# Patient Record
Sex: Female | Born: 1980 | Race: Black or African American | Hispanic: No | Marital: Single | Smoking: Former smoker
Health system: Southern US, Community
[De-identification: ages and names within clinical notes are randomized; demographics above are authoritative.]

## PROBLEM LIST (undated history)

## (undated) ENCOUNTER — Inpatient Hospital Stay: Payer: Self-pay | Admitting: Cardiology

## (undated) DIAGNOSIS — E785 Hyperlipidemia, unspecified: Secondary | ICD-10-CM

## (undated) DIAGNOSIS — K219 Gastro-esophageal reflux disease without esophagitis: Secondary | ICD-10-CM

## (undated) DIAGNOSIS — I10 Essential (primary) hypertension: Secondary | ICD-10-CM

## (undated) DIAGNOSIS — Z720 Tobacco use: Secondary | ICD-10-CM

## (undated) DIAGNOSIS — Z955 Presence of coronary angioplasty implant and graft: Secondary | ICD-10-CM

## (undated) DIAGNOSIS — I2510 Atherosclerotic heart disease of native coronary artery without angina pectoris: Secondary | ICD-10-CM

## (undated) DIAGNOSIS — I219 Acute myocardial infarction, unspecified: Secondary | ICD-10-CM

## (undated) HISTORY — PX: TUBAL LIGATION: SHX77

## (undated) HISTORY — DX: Essential (primary) hypertension: I10

## (undated) HISTORY — DX: Hyperlipidemia, unspecified: E78.5

---

## 2006-02-11 ENCOUNTER — Emergency Department: Payer: Self-pay | Admitting: Emergency Medicine

## 2006-02-12 ENCOUNTER — Ambulatory Visit: Payer: Self-pay

## 2006-03-13 ENCOUNTER — Emergency Department: Payer: Self-pay | Admitting: Emergency Medicine

## 2006-04-10 ENCOUNTER — Ambulatory Visit: Payer: Self-pay | Admitting: Unknown Physician Specialty

## 2006-04-22 ENCOUNTER — Emergency Department: Payer: Self-pay | Admitting: Emergency Medicine

## 2006-08-01 ENCOUNTER — Ambulatory Visit: Payer: Self-pay

## 2006-10-05 ENCOUNTER — Observation Stay: Payer: Self-pay

## 2006-10-09 ENCOUNTER — Inpatient Hospital Stay: Payer: Self-pay

## 2006-10-12 ENCOUNTER — Inpatient Hospital Stay: Payer: Self-pay

## 2007-07-15 ENCOUNTER — Emergency Department: Payer: Self-pay | Admitting: Emergency Medicine

## 2008-01-28 ENCOUNTER — Ambulatory Visit: Payer: Self-pay | Admitting: Urology

## 2008-02-05 ENCOUNTER — Observation Stay: Payer: Self-pay | Admitting: Unknown Physician Specialty

## 2008-02-13 ENCOUNTER — Observation Stay: Payer: Self-pay | Admitting: Unknown Physician Specialty

## 2008-02-16 ENCOUNTER — Inpatient Hospital Stay: Payer: Self-pay | Admitting: Obstetrics and Gynecology

## 2008-03-28 ENCOUNTER — Ambulatory Visit: Payer: Self-pay | Admitting: Urology

## 2008-03-31 ENCOUNTER — Ambulatory Visit: Payer: Self-pay | Admitting: Urology

## 2008-04-07 ENCOUNTER — Ambulatory Visit: Payer: Self-pay | Admitting: Urology

## 2008-04-16 ENCOUNTER — Ambulatory Visit: Payer: Self-pay | Admitting: Urology

## 2008-07-13 ENCOUNTER — Ambulatory Visit: Payer: Self-pay | Admitting: Urology

## 2008-07-16 ENCOUNTER — Ambulatory Visit: Payer: Self-pay | Admitting: Urology

## 2008-07-22 ENCOUNTER — Ambulatory Visit: Payer: Self-pay | Admitting: Urology

## 2008-08-02 ENCOUNTER — Inpatient Hospital Stay: Payer: Self-pay | Admitting: Obstetrics and Gynecology

## 2008-08-17 ENCOUNTER — Ambulatory Visit: Payer: Self-pay | Admitting: Urology

## 2008-12-17 ENCOUNTER — Ambulatory Visit: Payer: Self-pay | Admitting: Family Medicine

## 2009-03-26 ENCOUNTER — Ambulatory Visit: Payer: Self-pay | Admitting: Family Medicine

## 2009-06-25 ENCOUNTER — Observation Stay: Payer: Self-pay

## 2009-07-01 ENCOUNTER — Inpatient Hospital Stay: Payer: Self-pay | Admitting: Obstetrics and Gynecology

## 2009-08-06 ENCOUNTER — Other Ambulatory Visit: Payer: Self-pay

## 2009-08-15 ENCOUNTER — Inpatient Hospital Stay: Payer: Self-pay

## 2009-09-29 IMAGING — CR DG ABDOMEN 1V
1 series · 3 of 3 positions shown · non-contrast
Comparison: none

REASON FOR EXAM: NEPHROLITHIASIS
COMMENTS:

[Series 1: view not recorded · 0.17mm/px · 3 of 3 slices shown]
[im 1/3]
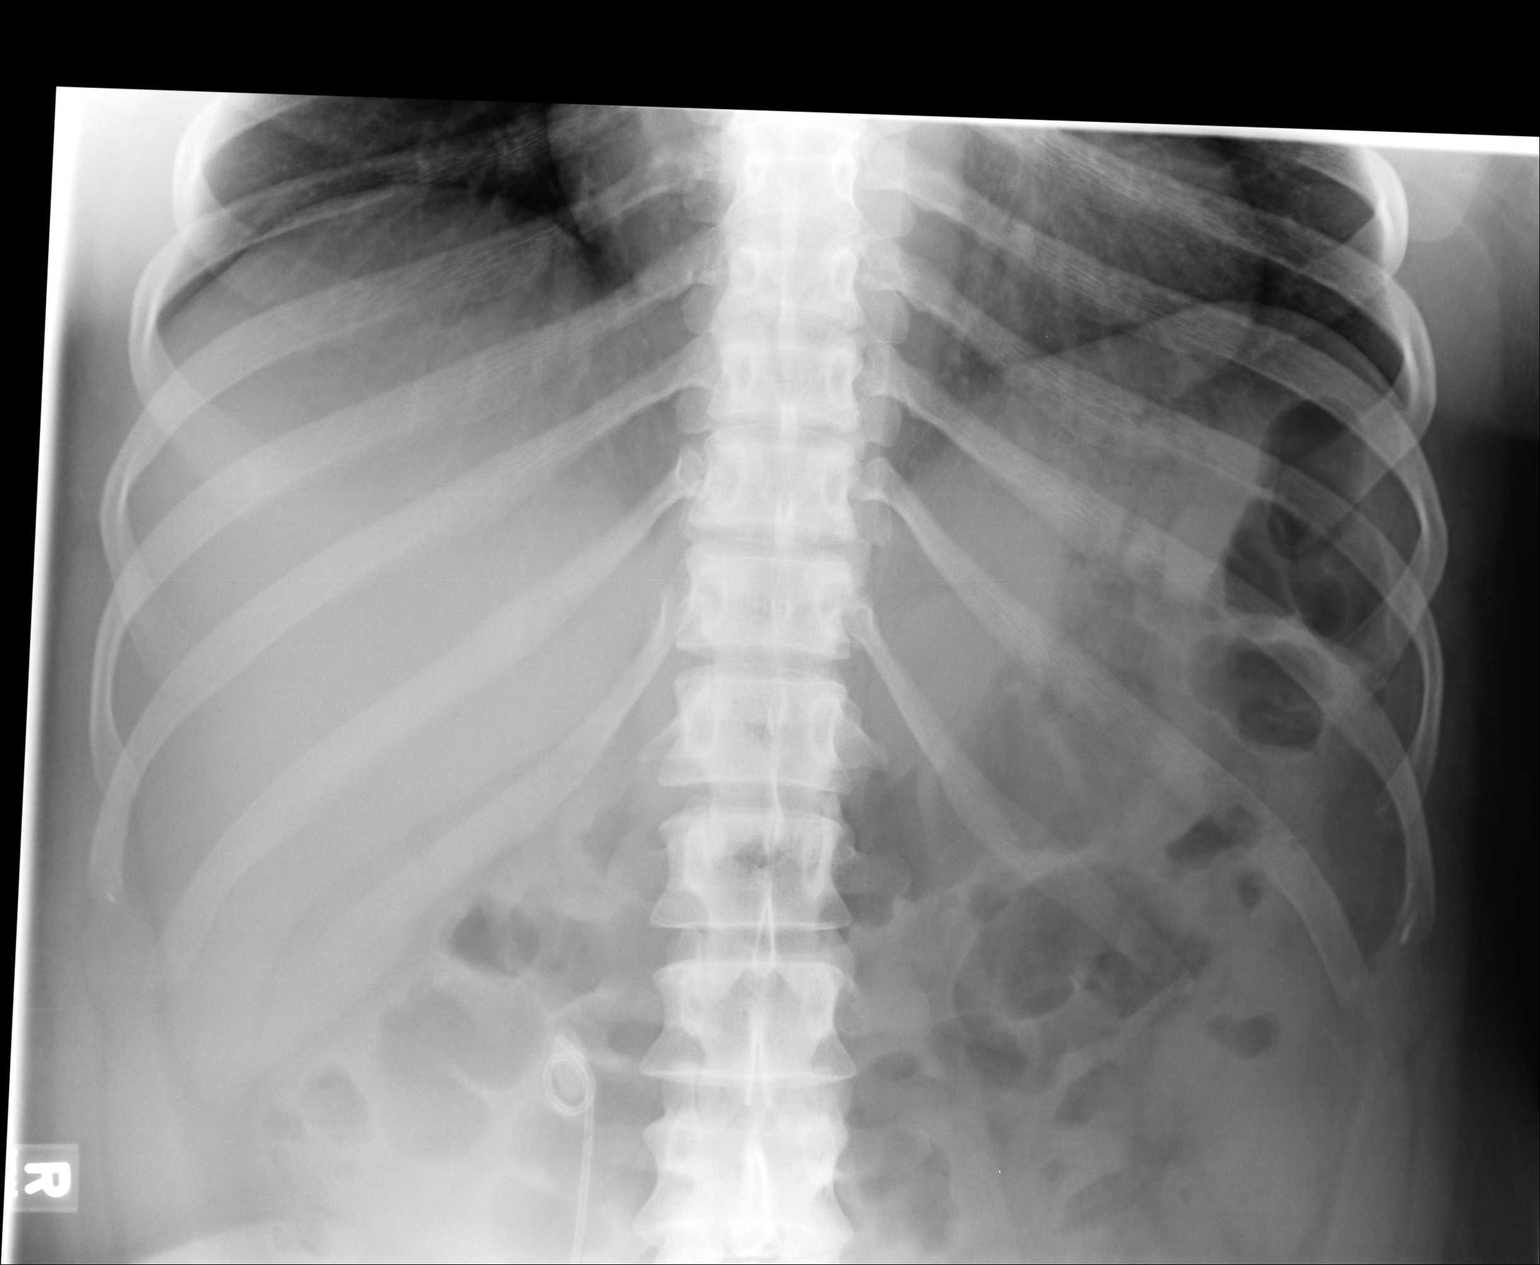
[im 2/3]
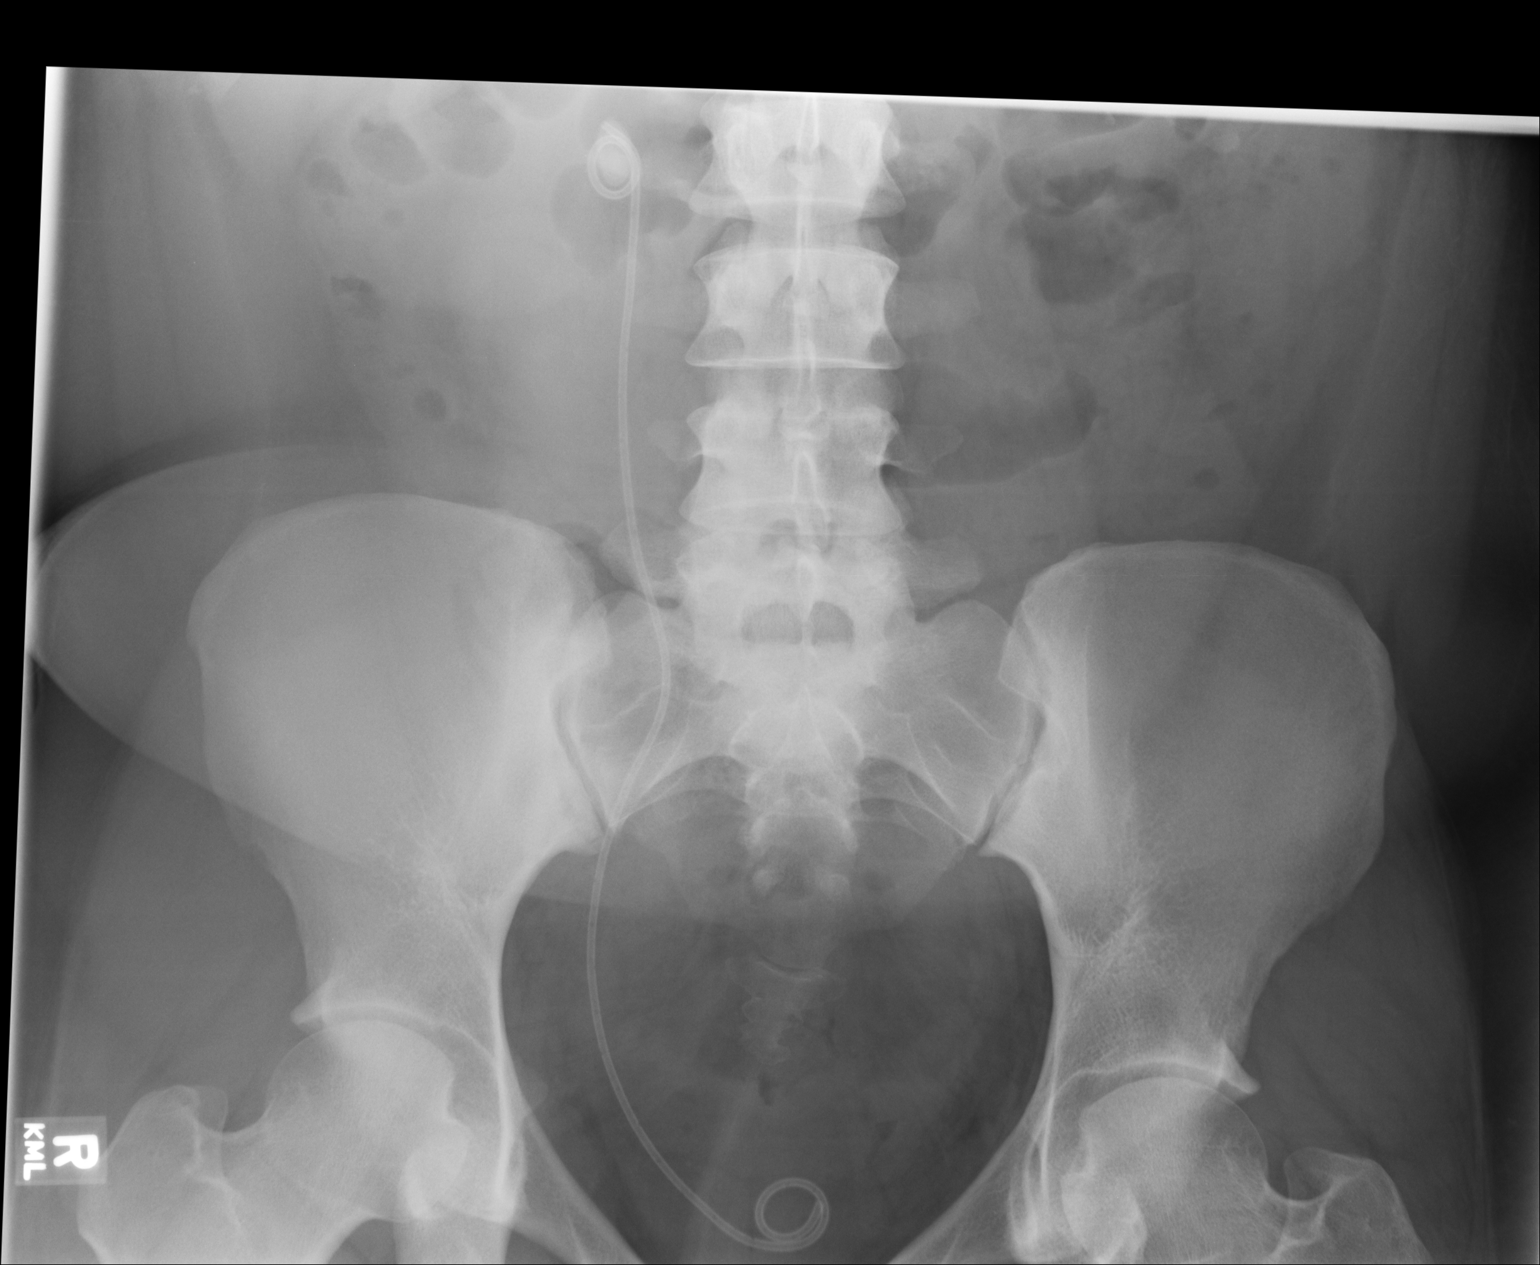
[im 3/3]
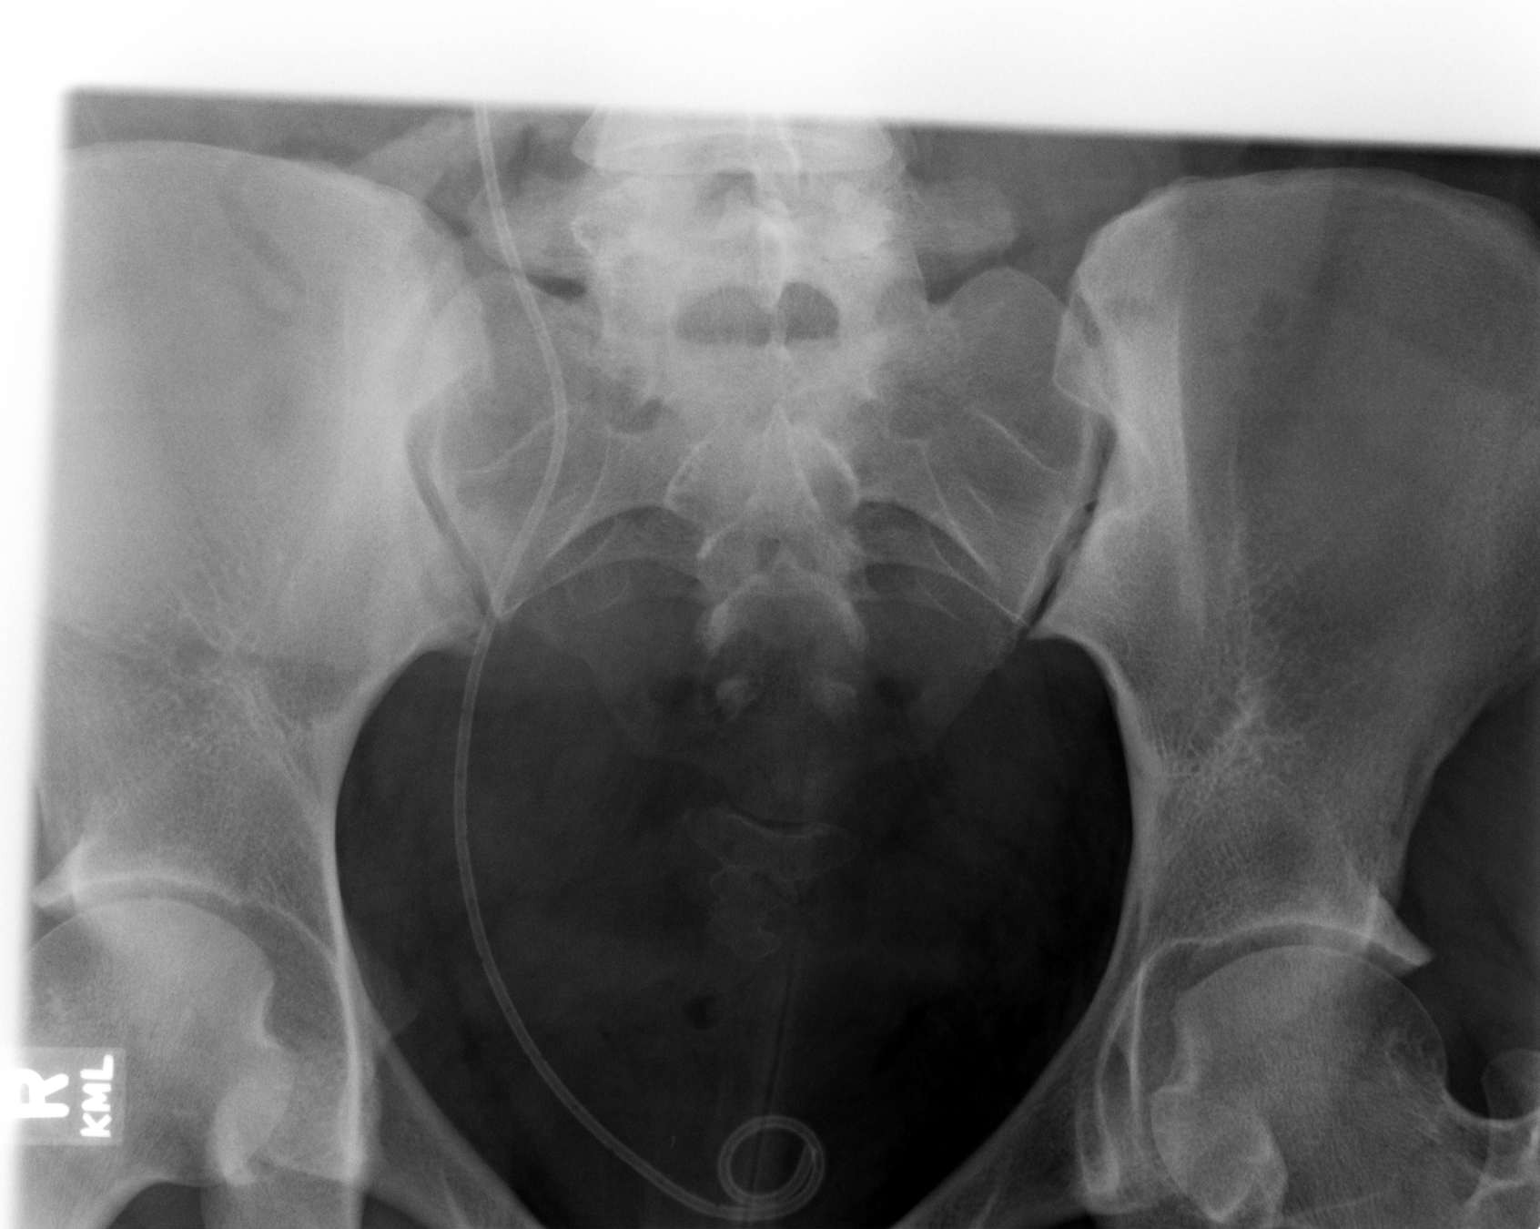

[3 of 3 positions shown; findings below may reference images not displayed]

PROCEDURE:     DXR - DXR KIDNEY URETER BLADDER  - July 13, 2008  [DATE]

RESULT:     Comparison is made to a prior study dated 04/16/08.

A RIGHT-sided double J ureteral stent is appreciated with the proximal tip
curled in the expected region of the RIGHT renal pelvis and distal tip
curled in the region of the urinary bladder. A rounded calcified density
projects in the area of the expected renal pelvis unchanged when compared to
the previous study likely representing a renal calculus.  No further calculi
are identified. Air is seen within nondilated loops of large and small
bowel.  The visualized bony skeleton is unremarkable.
IMPRESSION: RIGHT sided ureteral stent as well as what appears to be nephrolithiasis
involving the RIGHT kidney.

## 2009-10-18 IMAGING — US US RENAL KIDNEY
1 series · 17 of 25 positions shown · non-contrast
Comparison: none

REASON FOR EXAM: flank pain, hx of kidney stone
COMMENTS:

PROCEDURE:     US  - US KIDNEY  - August 02, 2008 [DATE]
RESULT:     Comparison: 01/28/2008
INDICATION: Flank pain

[Series 1: us renal kidney · 17 of 41 slices shown]
[im 1/41]
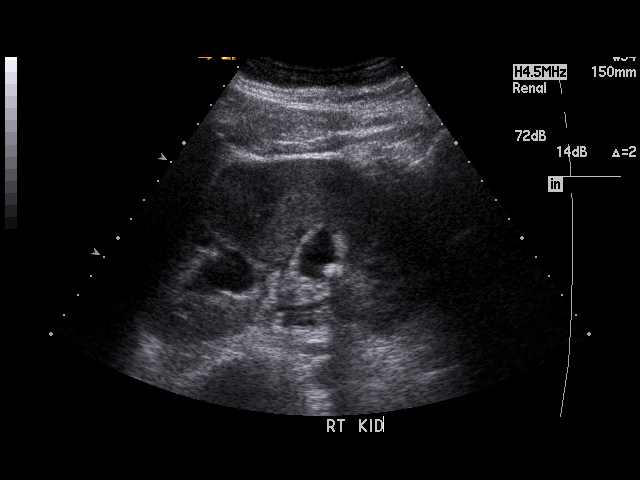
[im 4/41]
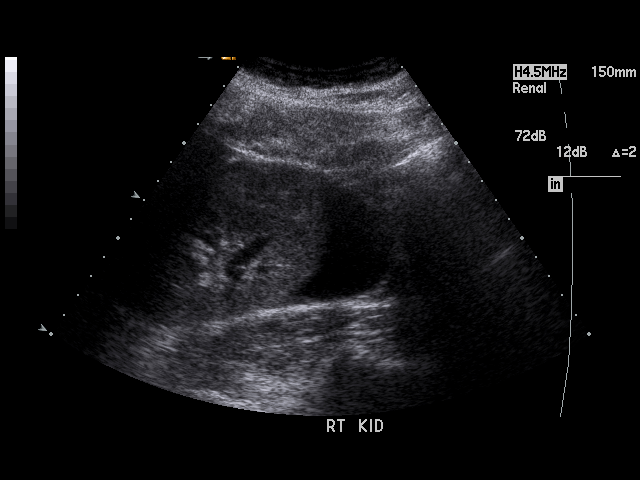
[im 6/41]
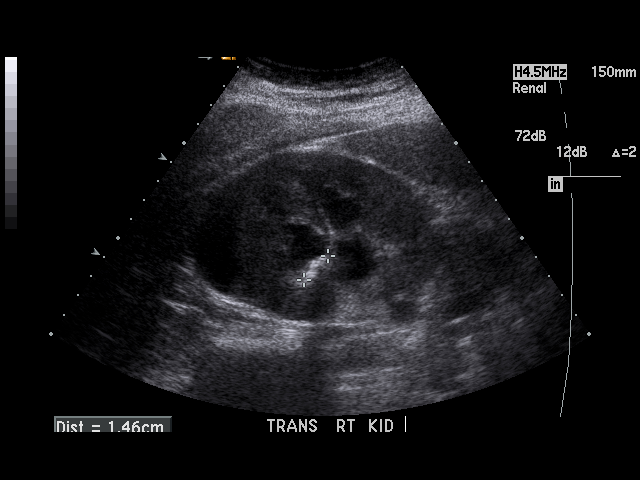
[im 9/41]
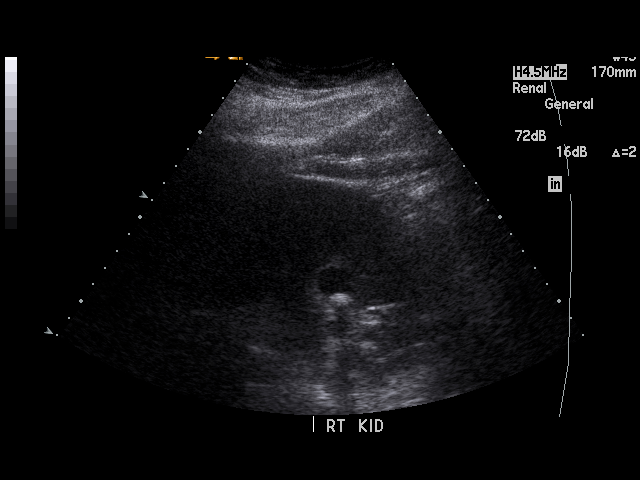
[im 11/41]
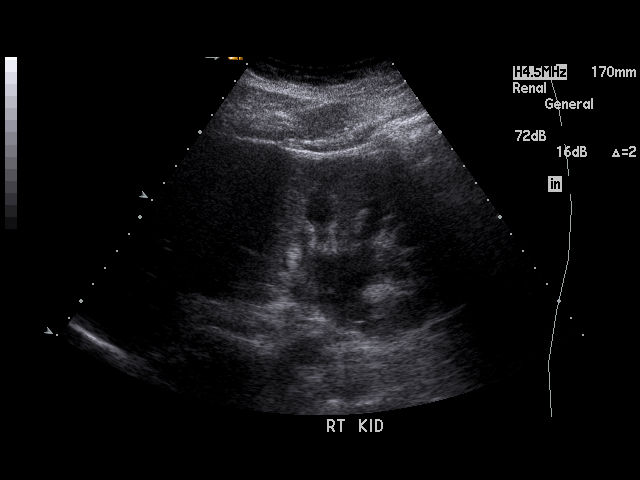
[im 14/41]
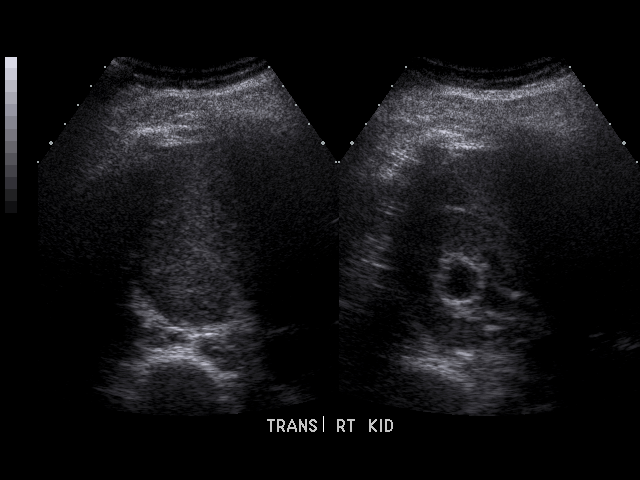
[im 16/41]
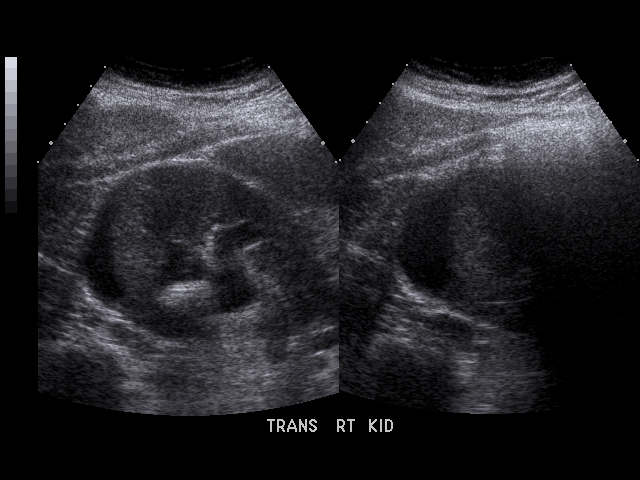
[im 19/41]
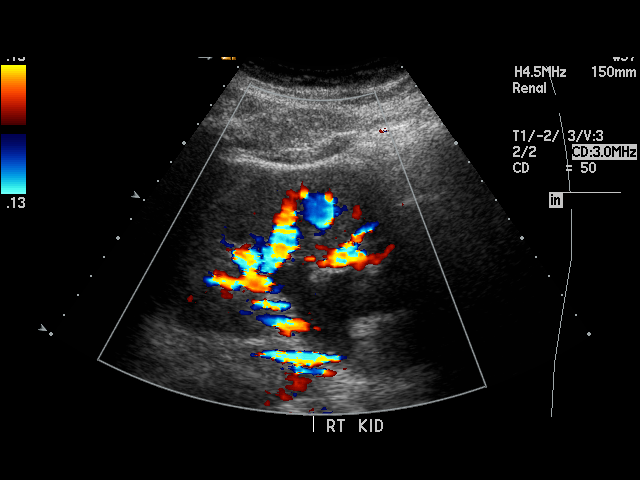
[im 21/41]
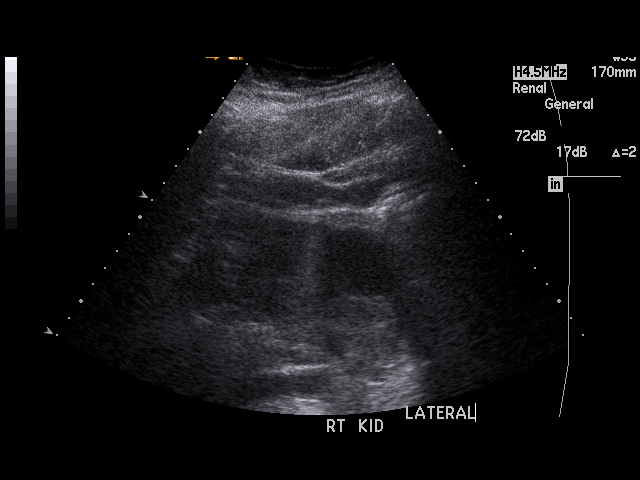
[im 22/41]
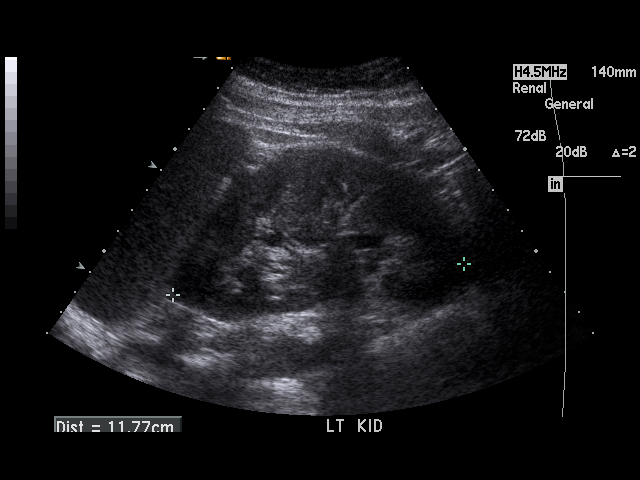
[im 26/41]
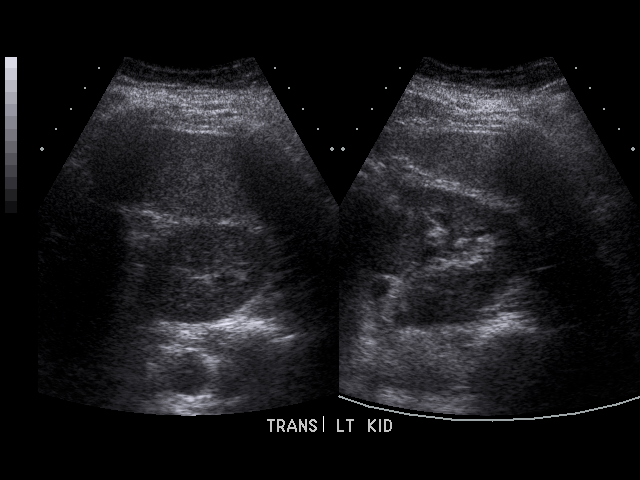
[im 27/41]
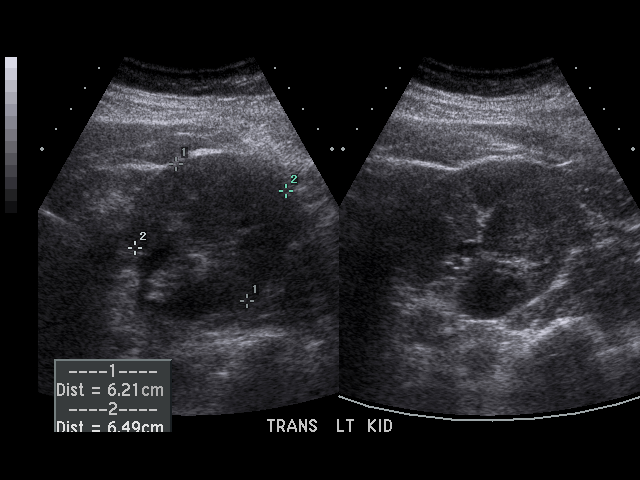
[im 31/41]
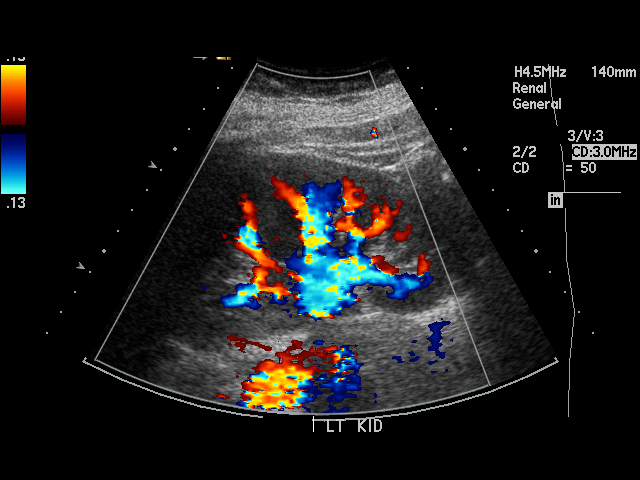
[im 32/41]
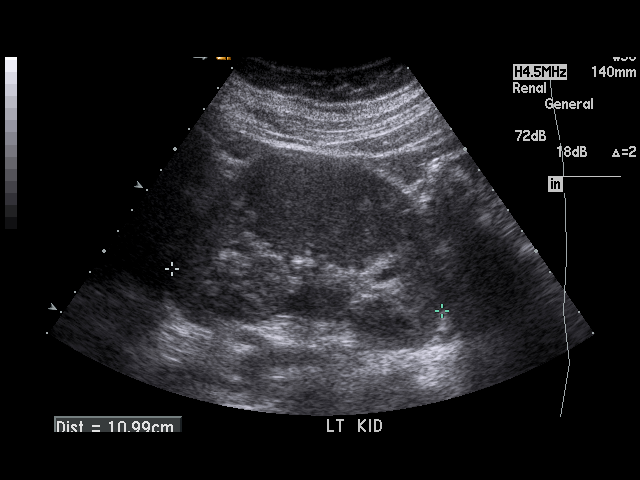
[im 36/41]
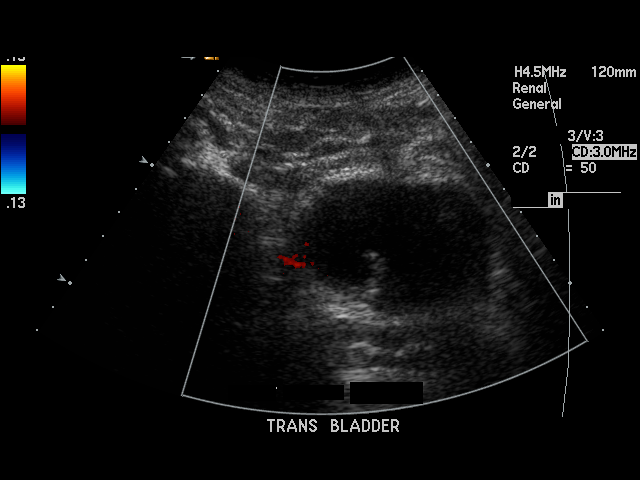
[im 37/41]
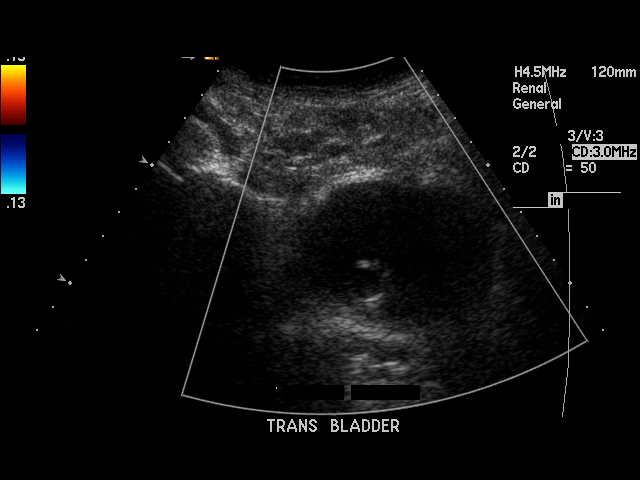
[im 41/41]
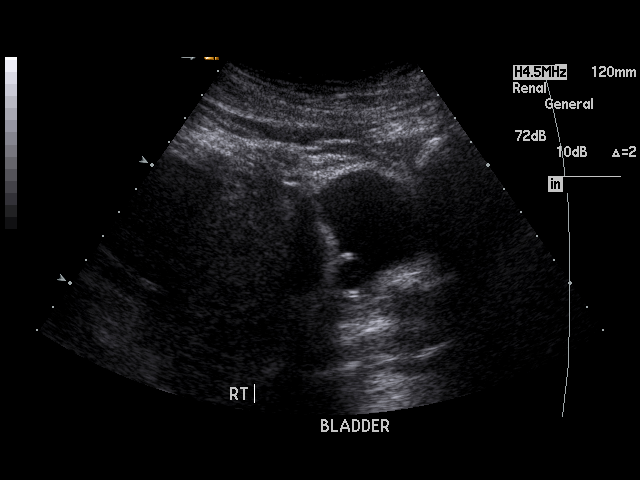

[17 of 25 positions shown; findings below may reference images not displayed]

Technique and findings: Multiple gray-scale and color doppler images of the
kidneys were obtained.

The right kidney measures 13.4 cm, the left kidney measures 11.8 cm.

There has been interval placement of a right-sided nephroureteral stent.
There is moderate right hydronephrosis. There is no left hydronephrosis.
There is a small amount of right perinephric fluid. There is no evidence of
a renal calculus.
IMPRESSION: Moderate right hydronephrosis with a right-sided nephroureteral stent
present.

## 2009-11-03 IMAGING — CR DG ABDOMEN 1V
1 series · 2 of 2 positions shown · non-contrast
Comparison: none

REASON FOR EXAM: nephrolithaisis
COMMENTS:

[Series 1: view not recorded · 0.17mm/px · 2 of 2 slices shown]
[im 1/2]
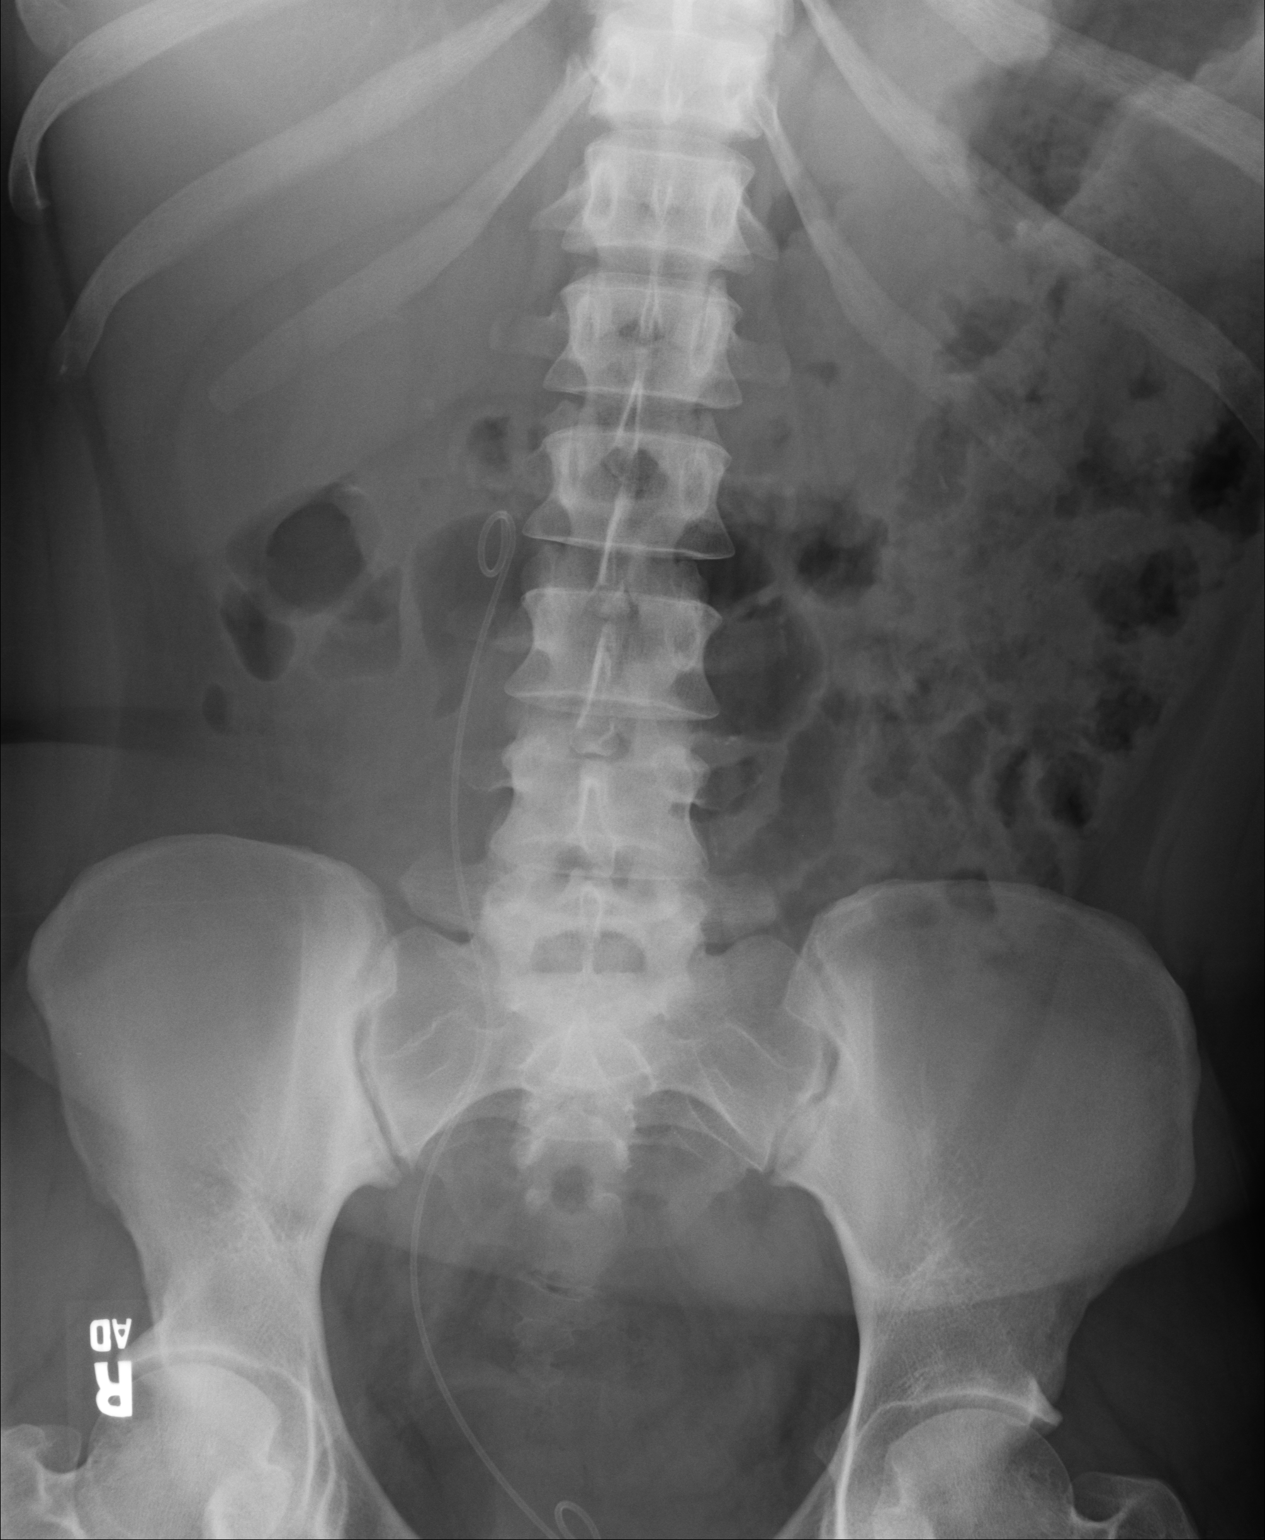
[im 2/2]
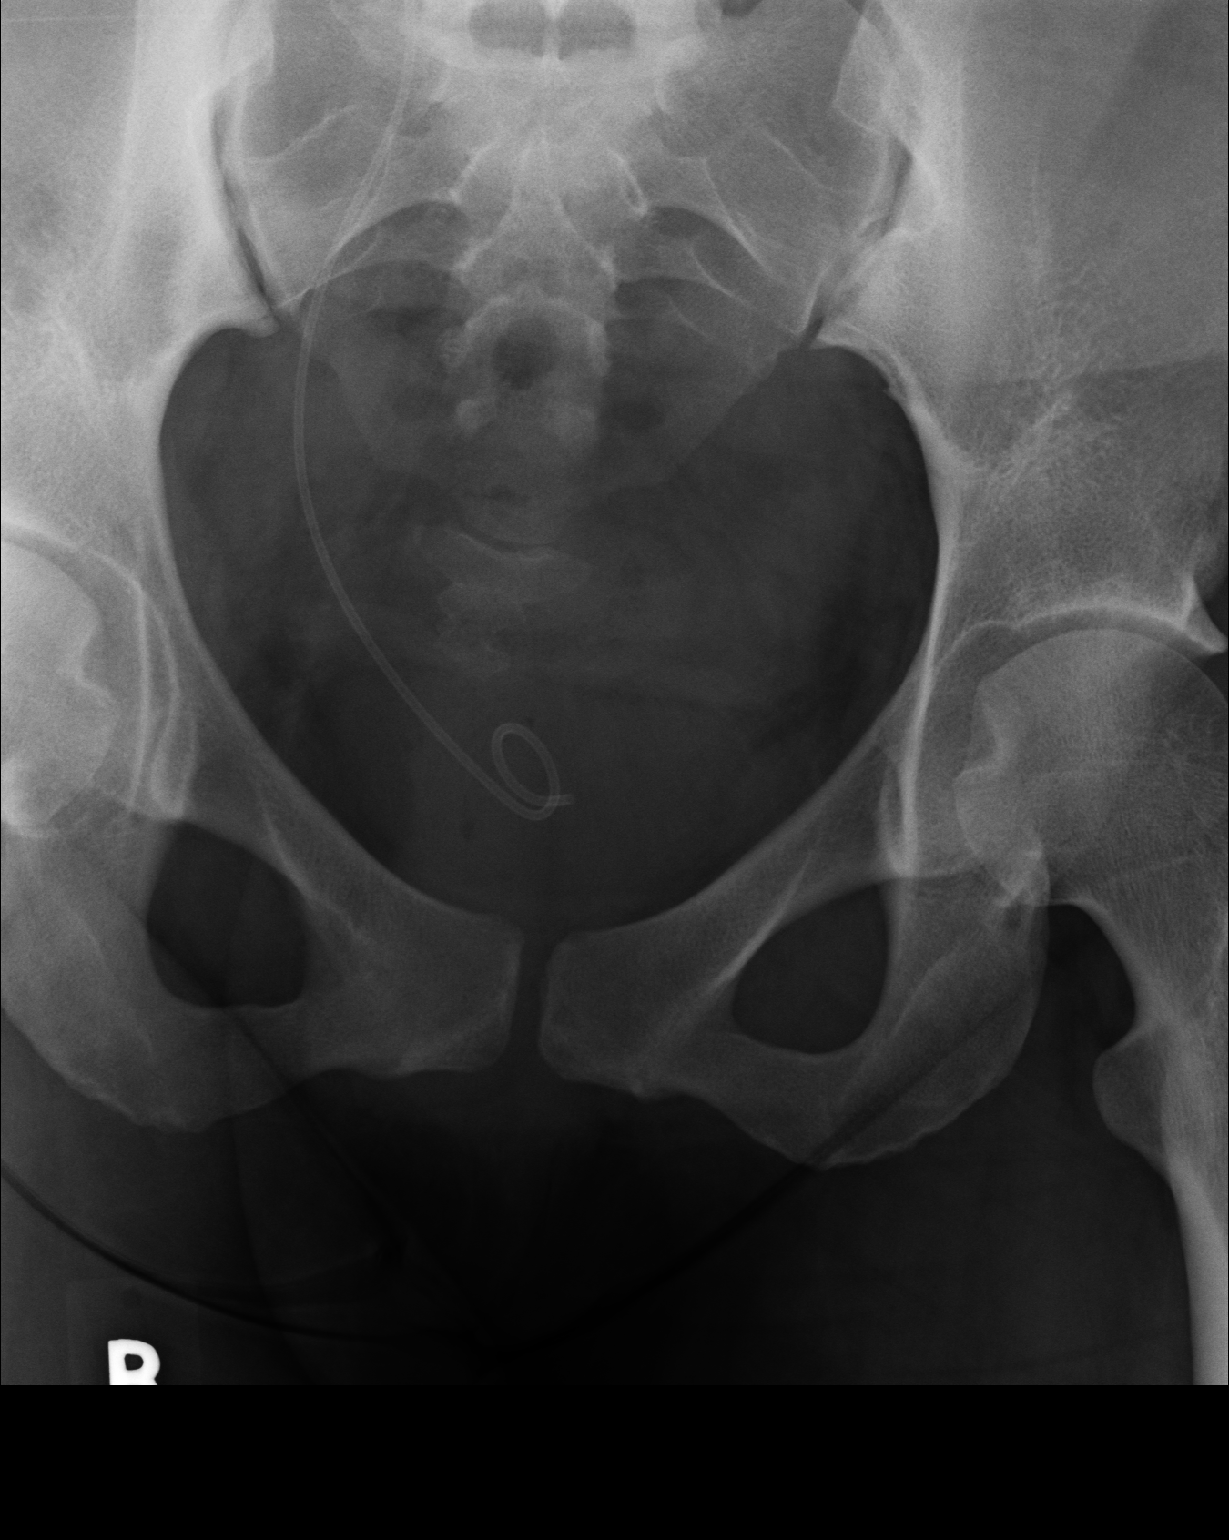

[2 of 2 positions shown; findings below may reference images not displayed]

PROCEDURE:     DXR - DXR KIDNEY URETER BLADDER  - August 17, 2008  [DATE]

RESULT:     Comparison is made to a prior exam of 07/13/2008. A double-J
stent is again noted on the RIGHT. The upper portion appears to be coiled in
the proximal RIGHT ureter. The previously present stone in the proximal
RIGHT ureter or renal pelvis is no longer observed. There is a faint density
projected medially over the RIGHT kidney which could represent a residual
small stone fragment this is not definite. No ureteral stones are
identified. No LEFT renal stones are noted.
IMPRESSION: Please see above.

## 2010-02-10 ENCOUNTER — Ambulatory Visit: Payer: Self-pay | Admitting: Unknown Physician Specialty

## 2010-02-11 ENCOUNTER — Ambulatory Visit: Payer: Self-pay | Admitting: Unknown Physician Specialty

## 2013-02-05 ENCOUNTER — Emergency Department: Payer: Self-pay | Admitting: Emergency Medicine

## 2014-09-10 ENCOUNTER — Emergency Department: Payer: Self-pay | Admitting: Emergency Medicine

## 2017-02-06 ENCOUNTER — Emergency Department
Admission: EM | Admit: 2017-02-06 | Discharge: 2017-02-06 | Disposition: A | Discharge status: Home / Self Care | Payer: Self-pay | Attending: Emergency Medicine | Admitting: Emergency Medicine

## 2017-02-06 ENCOUNTER — Emergency Department: Payer: Self-pay

## 2017-02-06 ENCOUNTER — Encounter: Payer: Self-pay | Admitting: Emergency Medicine

## 2017-02-06 DIAGNOSIS — M5412 Radiculopathy, cervical region: Secondary | ICD-10-CM | POA: Insufficient documentation

## 2017-02-06 DIAGNOSIS — R079 Chest pain, unspecified: Secondary | ICD-10-CM | POA: Insufficient documentation

## 2017-02-06 DIAGNOSIS — R0789 Other chest pain: Secondary | ICD-10-CM

## 2017-02-06 LAB — CBC
HCT: 41.2 % (ref 35.0–47.0)
Hemoglobin: 14.2 g/dL (ref 12.0–16.0)
MCH: 30.2 pg (ref 26.0–34.0)
MCHC: 34.4 g/dL (ref 32.0–36.0)
MCV: 87.8 fL (ref 80.0–100.0)
Platelets: 243 10*3/uL (ref 150–440)
RBC: 4.69 MIL/uL (ref 3.80–5.20)
RDW: 14.9 % — ABNORMAL HIGH (ref 11.5–14.5)
WBC: 10.8 10*3/uL (ref 3.6–11.0)

## 2017-02-06 LAB — BASIC METABOLIC PANEL
Anion gap: 5 (ref 5–15)
BUN: 10 mg/dL (ref 6–20)
CO2: 24 mmol/L (ref 22–32)
Calcium: 8.9 mg/dL (ref 8.9–10.3)
Chloride: 107 mmol/L (ref 101–111)
Creatinine, Ser: 0.82 mg/dL (ref 0.44–1.00)
GFR calc Af Amer: >60 mL/min (ref >60)
GFR calc non Af Amer: >60 mL/min (ref >60)
Glucose, Bld: 83 mg/dL (ref 65–99)
Potassium: 3.8 mmol/L (ref 3.5–5.1)
Sodium: 136 mmol/L (ref 135–145)

## 2017-02-06 LAB — TROPONIN I: Troponin I: <0.03 ng/mL (ref <0.03)

## 2017-02-06 MED ORDER — PREDNISONE 20 MG PO TABS: 40.0000 mg | ORAL_TABLET | Freq: Every day | ORAL | 0 refills | Status: DC

## 2017-02-06 MED ORDER — FAMOTIDINE 20 MG PO TABS: 20.0000 mg | ORAL_TABLET | Freq: Two times a day (BID) | ORAL | 0 refills | Status: DC

## 2017-02-06 MED ORDER — NAPROXEN 500 MG PO TABS: 500.0000 mg | ORAL_TABLET | Freq: Two times a day (BID) | ORAL | 0 refills | Status: DC

## 2017-02-06 NOTE — Discharge Instructions (Signed)
Results for orders placed or performed during the hospital encounter of 02/06/17  Basic metabolic panel  Result Value Ref Range   Sodium 136 135 - 145 mmol/L   Potassium 3.8 3.5 - 5.1 mmol/L   Chloride 107 101 - 111 mmol/L   CO2 24 22 - 32 mmol/L   Glucose, Bld 83 65 - 99 mg/dL   BUN 10 6 - 20 mg/dL   Creatinine, Ser 8.110.82 0.44 - 1.00 mg/dL   Calcium 8.9 8.9 - 91.410.3 mg/dL   GFR calc non Af Amer >60 >60 mL/min   GFR calc Af Amer >60 >60 mL/min   Anion gap 5 5 - 15  CBC  Result Value Ref Range   WBC 10.8 3.6 - 11.0 K/uL   RBC 4.69 3.80 - 5.20 MIL/uL   Hemoglobin 14.2 12.0 - 16.0 g/dL   HCT 78.241.2 95.635.0 - 21.347.0 %   MCV 87.8 80.0 - 100.0 fL   MCH 30.2 26.0 - 34.0 pg   MCHC 34.4 32.0 - 36.0 g/dL   RDW 08.614.9 (H) 57.811.5 - 46.914.5 %   Platelets 243 150 - 440 K/uL  Troponin I  Result Value Ref Range   Troponin I <0.03 <0.03 ng/mL   Dg Chest 2 View  Result Date: 02/06/2017 CLINICAL DATA:  Chest, left arm numbness for 2 days EXAM: CHEST  2 VIEW COMPARISON:  None. FINDINGS: The heart size and mediastinal contours are within normal limits. Both lungs are clear. The visualized skeletal structures are unremarkable. IMPRESSION: No active cardiopulmonary disease. Electronically Signed   By: Elige KoHetal  Patel   On: 02/06/2017 16:59

## 2017-02-06 NOTE — ED Triage Notes (Signed)
Pt reports centralized chest pain that started last night, denies N/V/SHOB. Also reports left arm intermittent numbness that started a few days ago. Pt reports some dizziness.

## 2017-02-06 NOTE — ED Provider Notes (Signed)
[facility] Emergency Department Provider Note  ____________________________________________  Time seen: Approximately 5:32 PM  I have reviewed the triage vital signs and the nursing notes.   HISTORY  Chief Complaint Chest Pain    HPI Sonya [name] is a 36 y.o. female who complains of central chest pain that started last night, gradual onset, mild to moderate intensity at present, constant, no aggravating or alleviating factors. Described as dull. Nonpleuritic nonexertional. No associated shortness of breath diaphoresis or vomiting. Nonradiating.  She also reports left neck pain and intermittent paresthesia in the left upper extremity for the past few days. Worse with neck movement. No history of trauma.     No past medical history on file. None  There are no active problems to display for this patient.    No past surgical history on file. None  Prior to Admission medications   Medication Sig Start Date End Date Taking? Authorizing Provider  famotidine (PEPCID) 20 MG tablet Take 1 tablet (20 mg total) by mouth 2 (two) times daily. 02/06/17   Sharman CheekStafford, Zimere Dunlevy, MD  naproxen (NAPROSYN) 500 MG tablet Take 1 tablet (500 mg total) by mouth 2 (two) times daily with a meal. 02/06/17   Sharman CheekStafford, Iva Montelongo, MD  predniSONE (DELTASONE) 20 MG tablet Take 2 tablets (40 mg total) by mouth daily. 02/06/17   Sharman CheekStafford, Dameer Speiser, MD     Allergies Penicillins   No family history on file.  Social History Social History  Substance Use Topics  . Smoking status: Not on file  . Smokeless tobacco: Not on file  . Alcohol use Not on file  No tobacco or alcohol use  Review of Systems  Constitutional:   No fever or chills.  ENT:   No sore throat. No rhinorrhea. Cardiovascular:   Positive as above for chest pain without syncope. Respiratory:   No dyspnea or cough. Gastrointestinal:   Negative for abdominal pain, vomiting and diarrhea.  Musculoskeletal:   Positive  left neck pain and left arm paresthesia All other systems reviewed and are negative except as documented above in ROS and HPI.  ____________________________________________   PHYSICAL EXAM:  VITAL SIGNS: ED Triage Vitals [02/06/17 1625]  Enc Vitals Group     BP (!) 143/95     Pulse Rate 86     Resp 16     Temp 98.6 F (37 C)     Temp Source Oral     SpO2 100 %     Weight 230 lb (104.3 kg)     Height 5\' 11"  (1.803 m)     Head Circumference      Peak Flow      Pain Score 7     Pain Loc      Pain Edu?      Excl. in GC?     Vital signs reviewed, nursing assessments reviewed.   Constitutional:   Alert and oriented. Well appearing and in no distress. Eyes:   No scleral icterus.  EOMI. No nystagmus. No conjunctival pallor. PERRL. ENT   Head:   Normocephalic and atraumatic.   Nose:   No congestion/rhinnorhea.    Mouth/Throat:   MMM, no pharyngeal erythema. No peritonsillar mass.    Neck:   No meningismus. Full ROM. Neck pain and arm pain are reproduced by having the patient rotate the neck to the left and axial load the head and C-spine. Hematological/Lymphatic/Immunilogical:   No cervical lymphadenopathy. Cardiovascular:   RRR. Symmetric bilateral radial and DP pulses.  No  murmurs.  Respiratory:   Normal respiratory effort without tachypnea/retractions. Breath sounds are clear and equal bilaterally. No wheezes/rales/rhonchi. Gastrointestinal:   Soft and nontender. Non distended. There is no CVA tenderness.  No rebound, rigidity, or guarding. Genitourinary:   deferred Musculoskeletal:   Normal range of motion in all extremities. No joint effusions.  No lower extremity tenderness.  No edema. Neurologic:   Normal speech and language.  Motor grossly intact. No gross focal neurologic deficits are appreciated.  Skin:    Skin is warm, dry and intact. No rash noted.  No petechiae, purpura, or bullae.  ____________________________________________    LABS (pertinent  positives/negatives) (all labs ordered are listed, but only abnormal results are displayed) Labs Reviewed  CBC - Abnormal; Notable for the following:       Result Value   RDW 14.9 (*)    All other components within normal limits  BASIC METABOLIC PANEL  TROPONIN I   ____________________________________________   EKG  Interpreted by me Normal sinus rhythm rate of 87, normal axis and intervals. Normal QRS and ST segments. Isolated T-wave inversion in lead 3 which is nonspecific. No evidence of right heart strain.  ____________________________________________    RADIOLOGY  Dg Chest 2 View  Result Date: 02/06/2017 CLINICAL DATA:  Chest, left arm numbness for 2 days EXAM: CHEST  2 VIEW COMPARISON:  None. FINDINGS: The heart size and mediastinal contours are within normal limits. Both lungs are clear. The visualized skeletal structures are unremarkable. IMPRESSION: No active cardiopulmonary disease. Electronically Signed   By: Elige Ko   On: 02/06/2017 16:59    ____________________________________________   PROCEDURES Procedures  ____________________________________________   INITIAL IMPRESSION / ASSESSMENT AND PLAN / ED COURSE  Pertinent labs & imaging results that were available during my care of the patient were reviewed by me and considered in my medical decision making (see chart for details).  Patient well appearing no acute distress, presents with atypical chest pain as well as left upper extremity symptoms and left neck pain which are clinically due to cervical radiculopathy. NSAIDs and steroid burst for the radiculopathy, Pepcid for gastric protection. Follow-up with primary care. No evidence of motor weakness or severe neurologic impairment from the radiculopathy.Considering the patient's symptoms, medical history, and physical examination today, I have low suspicion for ACS, PE, TAD, pneumothorax, carditis, mediastinitis, pneumonia, CHF, or sepsis.         ____________________________________________   FINAL CLINICAL IMPRESSION(S) / ED DIAGNOSES  Final diagnoses:  Atypical chest pain  Cervical radiculopathy      New Prescriptions   FAMOTIDINE (PEPCID) 20 MG TABLET    Take 1 tablet (20 mg total) by mouth 2 (two) times daily.   NAPROXEN (NAPROSYN) 500 MG TABLET    Take 1 tablet (500 mg total) by mouth 2 (two) times daily with a meal.   PREDNISONE (DELTASONE) 20 MG TABLET    Take 2 tablets (40 mg total) by mouth daily.     Portions of this note were generated with dragon dictation software. Dictation errors may occur despite best attempts at proofreading.    Sharman Cheek, MD 02/06/17 [phone]

## 2017-09-11 ENCOUNTER — Encounter: Payer: Self-pay | Admitting: Emergency Medicine

## 2017-09-11 ENCOUNTER — Other Ambulatory Visit: Payer: Self-pay

## 2017-09-11 ENCOUNTER — Telehealth: Payer: Self-pay | Admitting: Emergency Medicine

## 2017-09-11 ENCOUNTER — Emergency Department
Admission: EM | Admit: 2017-09-11 | Discharge: 2017-09-11 | Disposition: A | Discharge status: Home / Self Care | Payer: Self-pay | Attending: Emergency Medicine | Admitting: Emergency Medicine

## 2017-09-11 DIAGNOSIS — R1030 Lower abdominal pain, unspecified: Secondary | ICD-10-CM | POA: Insufficient documentation

## 2017-09-11 DIAGNOSIS — Z5321 Procedure and treatment not carried out due to patient leaving prior to being seen by health care provider: Secondary | ICD-10-CM | POA: Insufficient documentation

## 2017-09-11 LAB — CBC WITH DIFFERENTIAL/PLATELET
Basophils Absolute: 0 10*3/uL (ref 0–0.1)
Basophils Relative: 0 %
Eosinophils Absolute: 0.1 10*3/uL (ref 0–0.7)
Eosinophils Relative: 0 %
HCT: 40.3 % (ref 35.0–47.0)
Hemoglobin: 13.6 g/dL (ref 12.0–16.0)
Lymphocytes Relative: 27 %
Lymphs Abs: 3.5 10*3/uL (ref 1.0–3.6)
MCH: 29.2 pg (ref 26.0–34.0)
MCHC: 33.8 g/dL (ref 32.0–36.0)
MCV: 86.6 fL (ref 80.0–100.0)
Monocytes Absolute: 1 10*3/uL — ABNORMAL HIGH (ref 0.2–0.9)
Monocytes Relative: 8 %
Neutro Abs: 8.2 10*3/uL — ABNORMAL HIGH (ref 1.4–6.5)
Neutrophils Relative %: 65 %
Platelets: 252 10*3/uL (ref 150–440)
RBC: 4.65 MIL/uL (ref 3.80–5.20)
RDW: 14.4 % (ref 11.5–14.5)
WBC: 12.8 10*3/uL — ABNORMAL HIGH (ref 3.6–11.0)

## 2017-09-11 LAB — URINALYSIS, COMPLETE (UACMP) WITH MICROSCOPIC
Glucose, UA: NEGATIVE mg/dL
Leukocytes, UA: NEGATIVE
Nitrite: NEGATIVE
Protein, ur: 100 mg/dL — AB
Specific Gravity, Urine: >1.03 — ABNORMAL HIGH (ref 1.005–1.030)
pH: 5.5 (ref 5.0–8.0)

## 2017-09-11 LAB — COMPREHENSIVE METABOLIC PANEL
ALT: 13 U/L — ABNORMAL LOW (ref 14–54)
AST: 14 U/L — ABNORMAL LOW (ref 15–41)
Albumin: 3.5 g/dL (ref 3.5–5.0)
Alkaline Phosphatase: 66 U/L (ref 38–126)
Anion gap: 8 (ref 5–15)
BUN: 16 mg/dL (ref 6–20)
CO2: 21 mmol/L — ABNORMAL LOW (ref 22–32)
Calcium: 8.8 mg/dL — ABNORMAL LOW (ref 8.9–10.3)
Chloride: 107 mmol/L (ref 101–111)
Creatinine, Ser: 0.93 mg/dL (ref 0.44–1.00)
GFR calc Af Amer: >60 mL/min (ref >60)
GFR calc non Af Amer: >60 mL/min (ref >60)
Glucose, Bld: 107 mg/dL — ABNORMAL HIGH (ref 65–99)
Potassium: 3.5 mmol/L (ref 3.5–5.1)
Sodium: 136 mmol/L (ref 135–145)
Total Bilirubin: 0.3 mg/dL (ref 0.3–1.2)
Total Protein: 7.3 g/dL (ref 6.5–8.1)

## 2017-09-11 LAB — LIPASE, BLOOD: Lipase: 36 U/L (ref 11–51)

## 2017-09-11 LAB — POCT PREGNANCY, URINE: Preg Test, Ur: NEGATIVE

## 2017-09-11 NOTE — ED Notes (Signed)
Pt called from lobby to be taken to exam room. Unable to locate pt at this time.  

## 2017-09-11 NOTE — Telephone Encounter (Signed)
Called patient due to lwot to inquire about condition and follow up plans. Left message.   

## 2017-09-11 NOTE — ED Notes (Signed)
Attempted to call from lobby to be taken to exam room. Unable to locate pt at this time. Pt did not announce leaving to STAT desk.

## 2017-09-11 NOTE — ED Notes (Signed)
Pt called from lobby with no reply. Unable to locate pt at this time. Pt did not announce leaving to STAT desk.

## 2017-09-11 NOTE — ED Triage Notes (Signed)
Patient ambulatory to triage with steady gait, without difficulty or distress noted; pt reports lower back, lower abd pain since Friday with some dysuria

## 2018-05-09 ENCOUNTER — Emergency Department
Admission: EM | Admit: 2018-05-09 | Discharge: 2018-05-09 | Disposition: A | Discharge status: Home / Self Care | Payer: Self-pay | Attending: Student in an Organized Health Care Education/Training Program | Admitting: Student in an Organized Health Care Education/Training Program

## 2018-05-09 ENCOUNTER — Encounter: Payer: Self-pay | Admitting: Emergency Medicine

## 2018-05-09 ENCOUNTER — Emergency Department: Payer: Self-pay

## 2018-05-09 DIAGNOSIS — R112 Nausea with vomiting, unspecified: Secondary | ICD-10-CM | POA: Insufficient documentation

## 2018-05-09 DIAGNOSIS — R1013 Epigastric pain: Secondary | ICD-10-CM | POA: Insufficient documentation

## 2018-05-09 DIAGNOSIS — R1011 Right upper quadrant pain: Secondary | ICD-10-CM | POA: Insufficient documentation

## 2018-05-09 LAB — COMPREHENSIVE METABOLIC PANEL
ALT: 10 U/L (ref 0–44)
AST: 14 U/L — ABNORMAL LOW (ref 15–41)
Albumin: 3.6 g/dL (ref 3.5–5.0)
Alkaline Phosphatase: 53 U/L (ref 38–126)
Anion gap: 7 (ref 5–15)
BUN: 13 mg/dL (ref 6–20)
CO2: 23 mmol/L (ref 22–32)
Calcium: 8.5 mg/dL — ABNORMAL LOW (ref 8.9–10.3)
Chloride: 108 mmol/L (ref 98–111)
Creatinine, Ser: 0.74 mg/dL (ref 0.44–1.00)
GFR calc Af Amer: >60 mL/min (ref >60)
GFR calc non Af Amer: >60 mL/min (ref >60)
Glucose, Bld: 105 mg/dL — ABNORMAL HIGH (ref 70–99)
Potassium: 3.6 mmol/L (ref 3.5–5.1)
Sodium: 138 mmol/L (ref 135–145)
Total Bilirubin: 0.5 mg/dL (ref 0.3–1.2)
Total Protein: 6.7 g/dL (ref 6.5–8.1)

## 2018-05-09 LAB — URINALYSIS, COMPLETE (UACMP) WITH MICROSCOPIC
Bacteria, UA: NONE SEEN
Bilirubin Urine: NEGATIVE
Glucose, UA: NEGATIVE mg/dL
Hgb urine dipstick: NEGATIVE
Ketones, ur: 5 mg/dL — AB
Leukocytes, UA: NEGATIVE
Nitrite: NEGATIVE
Protein, ur: 30 mg/dL — AB
Specific Gravity, Urine: 1.03 (ref 1.005–1.030)
pH: 5 (ref 5.0–8.0)

## 2018-05-09 LAB — CBC
HCT: 42.7 % (ref 36.0–46.0)
Hemoglobin: 14.4 g/dL (ref 12.0–15.0)
MCH: 29.8 pg (ref 26.0–34.0)
MCHC: 33.7 g/dL (ref 30.0–36.0)
MCV: 88.2 fL (ref 80.0–100.0)
Platelets: 223 10*3/uL (ref 150–400)
RBC: 4.84 MIL/uL (ref 3.87–5.11)
RDW: 14.1 % (ref 11.5–15.5)
WBC: 11.4 10*3/uL — ABNORMAL HIGH (ref 4.0–10.5)
nRBC: 0 % (ref 0.0–0.2)

## 2018-05-09 LAB — POCT PREGNANCY, URINE: Preg Test, Ur: NEGATIVE

## 2018-05-09 LAB — LIPASE, BLOOD: Lipase: 30 U/L (ref 11–51)

## 2018-05-09 MED ORDER — FAMOTIDINE 20 MG PO TABS: 20.0000 mg | ORAL_TABLET | Freq: Two times a day (BID) | ORAL | 1 refills | Status: DC

## 2018-05-09 MED ORDER — PROMETHAZINE HCL 25 MG/ML IJ SOLN
25.0000 mg | Freq: Four times a day (QID) | INTRAMUSCULAR | Status: DC | PRN
  Administered 2018-05-09: 25 mg via INTRAMUSCULAR
  Filled 2018-05-09: qty 1

## 2018-05-09 MED ORDER — SUCRALFATE 1 G PO TABS: 1.0000 g | ORAL_TABLET | Freq: Three times a day (TID) | ORAL | 1 refills | Status: DC

## 2018-05-09 MED ORDER — HYDROCODONE-ACETAMINOPHEN 5-325 MG PO TABS
1.0000 | ORAL_TABLET | Freq: Once | ORAL | Status: AC
  Administered 2018-05-09: 1 via ORAL
  Filled 2018-05-09: qty 1

## 2018-05-09 MED ORDER — ALUM & MAG HYDROXIDE-SIMETH 200-200-20 MG/5ML PO SUSP
30.0000 mL | Freq: Once | ORAL | Status: AC
  Administered 2018-05-09: 30 mL via ORAL
  Filled 2018-05-09: qty 30

## 2018-05-09 NOTE — Discharge Instructions (Signed)

## 2018-05-09 NOTE — ED Triage Notes (Signed)
Patient presents to the ED with sharp mid abdominal pain since Monday.  Patient states if she drinks milk, pain eases but it will come back and she states each day pain has gotten worse.  Patient reports occasional vomiting.  x1 in the past 24 hours.  Patient denies diarrhea.

## 2018-05-09 NOTE — ED Provider Notes (Signed)
[facility] Emergency Department Provider Note    First MD Initiated Contact with Patient 05/09/18 1911     (approximate)  I have reviewed the triage vital signs and the nursing notes.   HISTORY  Chief Complaint Abdominal Pain    HPI Sonya [name] is a 37 y.o. female presents the ER with several days of sharp mid abdominal pain associate with nausea and vomiting.  States pain is worse after eating.  Says it does get somewhat better after drinking milk.  Denies any hematemesis.  No fevers.  Is never had pain like this before.  No pain radiating through to her back.  Does have a history of heartburn but this feels more severe than that.  Denies any chest pain or shortness of breath.    History reviewed. No pertinent past medical history. No family history on file. Past Surgical History:  Procedure Laterality Date  . TUBAL LIGATION     There are no active problems to display for this patient.     Prior to Admission medications   Medication Sig Start Date End Date Taking? Authorizing Provider  famotidine (PEPCID) 20 MG tablet Take 1 tablet (20 mg total) by mouth 2 (two) times daily. 05/09/18   Willy Eddyobinson, Saida Lonon, MD  sucralfate (CARAFATE) 1 g tablet Take 1 tablet (1 g total) by mouth 4 (four) times daily -  with meals and at bedtime. 05/09/18 05/09/19  Willy Eddyobinson, Santoria Chason, MD    Allergies Penicillins    Social History Social History   Tobacco Use  . Smoking status: Never Smoker  . Smokeless tobacco: Never Used  Substance Use Topics  . Alcohol use: Yes    Comment: occasionally  . Drug use: Not on file    Review of Systems Patient denies headaches, rhinorrhea, blurry vision, numbness, shortness of breath, chest pain, edema, cough, abdominal pain, nausea, vomiting, diarrhea, dysuria, fevers, rashes or hallucinations unless otherwise stated above in HPI. ____________________________________________   PHYSICAL EXAM:  VITAL SIGNS: Vitals:     05/09/18 1858  BP: (!) 148/103  Pulse: 89  Resp: 18  Temp: 98.4 F (36.9 C)  SpO2: 100%    Constitutional: Alert and oriented.  Eyes: Conjunctivae are normal.  Head: Atraumatic. Nose: No congestion/rhinnorhea. Mouth/Throat: Mucous membranes are moist.   Neck: No stridor. Painless ROM.  Cardiovascular: Normal rate, regular rhythm. Grossly normal heart sounds.  Good peripheral circulation. Respiratory: Normal respiratory effort.  No retractions. Lungs CTAB. Gastrointestinal: Soft but with mild right upper quadrant tenderness to palpation.  No peritonitis or rebound tenderness.. No distention. No abdominal bruits. No CVA tenderness. Genitourinary:  Musculoskeletal: No lower extremity tenderness nor edema.  No joint effusions. Neurologic:  Normal speech and language. No gross focal neurologic deficits are appreciated. No facial droop Skin:  Skin is warm, dry and intact. No rash noted. Psychiatric: Mood and affect are normal. Speech and behavior are normal.  ____________________________________________   LABS (all labs ordered are listed, but only abnormal results are displayed)  Results for orders placed or performed during the hospital encounter of 05/09/18 (from the past 24 hour(s))  Lipase, blood     Status: None   Collection Time: 05/09/18  6:59 PM  Result Value Ref Range   Lipase 30 11 - 51 U/L  Comprehensive metabolic panel     Status: Abnormal   Collection Time: 05/09/18  6:59 PM  Result Value Ref Range   Sodium 138 135 - 145 mmol/L   Potassium 3.6 3.5 - 5.1  mmol/L   Chloride 108 98 - 111 mmol/L   CO2 23 22 - 32 mmol/L   Glucose, Bld 105 (H) 70 - 99 mg/dL   BUN 13 6 - 20 mg/dL   Creatinine, Ser 1.61 0.44 - 1.00 mg/dL   Calcium 8.5 (L) 8.9 - 10.3 mg/dL   Total Protein 6.7 6.5 - 8.1 g/dL   Albumin 3.6 3.5 - 5.0 g/dL   AST 14 (L) 15 - 41 U/L   ALT 10 0 - 44 U/L   Alkaline Phosphatase 53 38 - 126 U/L   Total Bilirubin 0.5 0.3 - 1.2 mg/dL   GFR calc non Af Amer >60  >60 mL/min   GFR calc Af Amer >60 >60 mL/min   Anion gap 7 5 - 15  CBC     Status: Abnormal   Collection Time: 05/09/18  6:59 PM  Result Value Ref Range   WBC 11.4 (H) 4.0 - 10.5 K/uL   RBC 4.84 3.87 - 5.11 MIL/uL   Hemoglobin 14.4 12.0 - 15.0 g/dL   HCT 09.6 04.5 - 40.9 %   MCV 88.2 80.0 - 100.0 fL   MCH 29.8 26.0 - 34.0 pg   MCHC 33.7 30.0 - 36.0 g/dL   RDW 81.1 91.4 - 78.2 %   Platelets 223 150 - 400 K/uL   nRBC 0.0 0.0 - 0.2 %  Urinalysis, Complete w Microscopic     Status: Abnormal   Collection Time: 05/09/18  7:01 PM  Result Value Ref Range   Color, Urine AMBER (A) YELLOW   APPearance CLOUDY (A) CLEAR   Specific Gravity, Urine 1.030 1.005 - 1.030   pH 5.0 5.0 - 8.0   Glucose, UA NEGATIVE NEGATIVE mg/dL   Hgb urine dipstick NEGATIVE NEGATIVE   Bilirubin Urine NEGATIVE NEGATIVE   Ketones, ur 5 (A) NEGATIVE mg/dL   Protein, ur 30 (A) NEGATIVE mg/dL   Nitrite NEGATIVE NEGATIVE   Leukocytes, UA NEGATIVE NEGATIVE   RBC / HPF 0-5 0 - 5 RBC/hpf   WBC, UA 0-5 0 - 5 WBC/hpf   Bacteria, UA NONE SEEN NONE SEEN   Squamous Epithelial / LPF 21-50 0 - 5   Mucus PRESENT    Hyaline Casts, UA PRESENT   Pregnancy, urine POC     Status: None   Collection Time: 05/09/18  7:05 PM  Result Value Ref Range   Preg Test, Ur NEGATIVE NEGATIVE   ____________________________________________ ____________________________________________  RADIOLOGY  I personally reviewed all radiographic images ordered to evaluate for the above acute complaints and reviewed radiology reports and findings.  These findings were personally discussed with the patient.  Please see medical record for radiology report.  ____________________________________________   PROCEDURES  Procedure(s) performed:  Procedures    Critical Care performed: no ____________________________________________   INITIAL IMPRESSION / ASSESSMENT AND PLAN / ED COURSE  Pertinent labs & imaging results that were available during my  care of the patient were reviewed by me and considered in my medical decision making (see chart for details).   DDX: Enteritis, gastritis, cholelithiasis, cholecystitis, pancreatitis food poisoning  Sonya [name] is a 37 y.o. who presents to the ED with symptoms as described above.  Patient nontoxic appearing.  Patient is AFVSS in ED. Exam as above. Given current presentation have considered the above differential.  Patient location of pain ultrasound will be ordered to exclude biliary pathology.  Clinical Course as of May 10 2119  Thu May 09, 2018  2117 Repeat abdominal exam soft benign.  Ultrasound is reassuring.  Do suspect some component of gastritis.  Patient stable and appropriate for outpatient follow-up.  Patient was able to tolerate PO and was able to ambulate with a steady gait.    [PR]    Clinical Course User Index [PR] Willy Eddy, MD     As part of my medical decision making, I reviewed the following data within the electronic MEDICAL RECORD NUMBER Nursing notes reviewed and incorporated, Labs reviewed, notes from prior ED visits.   ____________________________________________   FINAL CLINICAL IMPRESSION(S) / ED DIAGNOSES  Final diagnoses:  Epigastric pain  Non-intractable vomiting with nausea, unspecified vomiting type      NEW MEDICATIONS STARTED DURING THIS VISIT:  New Prescriptions   SUCRALFATE (CARAFATE) 1 G TABLET    Take 1 tablet (1 g total) by mouth 4 (four) times daily -  with meals and at bedtime.     Note:  This document was prepared using Dragon voice recognition software and may include unintentional dictation errors.    Willy Eddy, MD 05/09/18 2120

## 2018-06-07 ENCOUNTER — Inpatient Hospital Stay
Admission: EM | Admit: 2018-06-07 | Discharge: 2018-06-11 | DRG: 247 | Disposition: A | Discharge status: Home / Self Care | Payer: Medicaid Other | Attending: Internal Medicine | Admitting: Internal Medicine

## 2018-06-07 ENCOUNTER — Other Ambulatory Visit: Payer: Self-pay

## 2018-06-07 ENCOUNTER — Emergency Department: Payer: Medicaid Other

## 2018-06-07 DIAGNOSIS — I214 Non-ST elevation (NSTEMI) myocardial infarction: Secondary | ICD-10-CM | POA: Diagnosis present

## 2018-06-07 DIAGNOSIS — Z8249 Family history of ischemic heart disease and other diseases of the circulatory system: Secondary | ICD-10-CM | POA: Diagnosis not present

## 2018-06-07 DIAGNOSIS — K219 Gastro-esophageal reflux disease without esophagitis: Secondary | ICD-10-CM | POA: Diagnosis present

## 2018-06-07 DIAGNOSIS — Z720 Tobacco use: Secondary | ICD-10-CM | POA: Diagnosis present

## 2018-06-07 DIAGNOSIS — Z716 Tobacco abuse counseling: Secondary | ICD-10-CM

## 2018-06-07 DIAGNOSIS — Z9851 Tubal ligation status: Secondary | ICD-10-CM | POA: Diagnosis not present

## 2018-06-07 DIAGNOSIS — Z880 Allergy status to penicillin: Secondary | ICD-10-CM

## 2018-06-07 DIAGNOSIS — R079 Chest pain, unspecified: Secondary | ICD-10-CM | POA: Diagnosis present

## 2018-06-07 DIAGNOSIS — E876 Hypokalemia: Secondary | ICD-10-CM | POA: Diagnosis present

## 2018-06-07 DIAGNOSIS — F17210 Nicotine dependence, cigarettes, uncomplicated: Secondary | ICD-10-CM | POA: Diagnosis present

## 2018-06-07 DIAGNOSIS — Z23 Encounter for immunization: Secondary | ICD-10-CM

## 2018-06-07 DIAGNOSIS — I213 ST elevation (STEMI) myocardial infarction of unspecified site: Secondary | ICD-10-CM | POA: Diagnosis present

## 2018-06-07 HISTORY — DX: Tobacco use: Z72.0

## 2018-06-07 HISTORY — DX: Gastro-esophageal reflux disease without esophagitis: K21.9

## 2018-06-07 LAB — URINE DRUG SCREEN, QUALITATIVE (ARMC ONLY)
Amphetamines, Ur Screen: NOT DETECTED
Barbiturates, Ur Screen: NOT DETECTED
Benzodiazepine, Ur Scrn: NOT DETECTED
Cannabinoid 50 Ng, Ur ~~LOC~~: NOT DETECTED
Cocaine Metabolite,Ur ~~LOC~~: NOT DETECTED
MDMA (Ecstasy)Ur Screen: NOT DETECTED
Methadone Scn, Ur: NOT DETECTED
Opiate, Ur Screen: NOT DETECTED
Phencyclidine (PCP) Ur S: NOT DETECTED
Tricyclic, Ur Screen: NOT DETECTED

## 2018-06-07 LAB — APTT: aPTT: 28 seconds (ref 24–36)

## 2018-06-07 LAB — CBC
HCT: 40 % (ref 36.0–46.0)
Hemoglobin: 13.3 g/dL (ref 12.0–15.0)
MCH: 29.1 pg (ref 26.0–34.0)
MCHC: 33.3 g/dL (ref 30.0–36.0)
MCV: 87.5 fL (ref 80.0–100.0)
Platelets: 233 10*3/uL (ref 150–400)
RBC: 4.57 MIL/uL (ref 3.87–5.11)
RDW: 14.1 % (ref 11.5–15.5)
WBC: 10.1 10*3/uL (ref 4.0–10.5)
nRBC: 0 % (ref 0.0–0.2)

## 2018-06-07 LAB — BASIC METABOLIC PANEL
Anion gap: 5 (ref 5–15)
BUN: 10 mg/dL (ref 6–20)
CO2: 19 mmol/L — ABNORMAL LOW (ref 22–32)
Calcium: 8.7 mg/dL — ABNORMAL LOW (ref 8.9–10.3)
Chloride: 111 mmol/L (ref 98–111)
Creatinine, Ser: 0.68 mg/dL (ref 0.44–1.00)
GFR calc Af Amer: >60 mL/min (ref >60)
GFR calc non Af Amer: >60 mL/min (ref >60)
Glucose, Bld: 133 mg/dL — ABNORMAL HIGH (ref 70–99)
Potassium: 3.2 mmol/L — ABNORMAL LOW (ref 3.5–5.1)
Sodium: 135 mmol/L (ref 135–145)

## 2018-06-07 LAB — PROTIME-INR
INR: 1
Prothrombin Time: 13.1 seconds (ref 11.4–15.2)

## 2018-06-07 LAB — HCG, QUANTITATIVE, PREGNANCY: hCG, Beta Chain, Quant, S: <1 m[IU]/mL (ref <5)

## 2018-06-07 LAB — TROPONIN I
Troponin I: 0.28 ng/mL (ref <0.03)
Troponin I: 0.28 ng/mL (ref <0.03)

## 2018-06-07 MED ORDER — HEPARIN SODIUM (PORCINE) 5000 UNIT/ML IJ SOLN
4000.0000 [IU] | Freq: Once | INTRAMUSCULAR | Status: AC
  Administered 2018-06-07: 4000 [IU] via INTRAVENOUS

## 2018-06-07 MED ORDER — IOHEXOL 350 MG/ML SOLN
75.0000 mL | Freq: Once | INTRAVENOUS | Status: AC | PRN
  Administered 2018-06-07: 75 mL via INTRAVENOUS

## 2018-06-07 MED ORDER — HEPARIN (PORCINE) 25000 UT/250ML-% IV SOLN
1450.0000 [IU]/h | INTRAVENOUS | Status: DC
  Administered 2018-06-07 – 2018-06-08 (×2): 1200 [IU]/h via INTRAVENOUS
  Administered 2018-06-09: 1500 [IU]/h via INTRAVENOUS
  Filled 2018-06-07 (×4): qty 250

## 2018-06-07 MED ORDER — NITROGLYCERIN 2 % TD OINT
0.5000 [in_us] | TOPICAL_OINTMENT | TRANSDERMAL | Status: AC
  Administered 2018-06-07: 0.5 [in_us] via TOPICAL
  Filled 2018-06-07: qty 1

## 2018-06-07 MED ORDER — NICOTINE 14 MG/24HR TD PT24
14.0000 mg | MEDICATED_PATCH | Freq: Every day | TRANSDERMAL | Status: DC
  Administered 2018-06-07 – 2018-06-11 (×5): 14 mg via TRANSDERMAL
  Filled 2018-06-07 (×5): qty 1

## 2018-06-07 NOTE — ED Notes (Signed)
Pt reports mid chest pain that radiated into her left arm and left neck. Pt states that she was driving to work last night when the pain started around 0900, pt states that the pain lasted until around 2 am this morning. Pt states that she had periods of diaphoresis and some lightheadedness when standing. Pt reports that both her parents died from MI in their 4160's.

## 2018-06-07 NOTE — ED Notes (Signed)
ED TO INPATIENT HANDOFF REPORT  Name/Age/Gender Sonya [name] 37 y.o. female  Code Status   Home/SNF/Other home  Chief Complaint EMS chest pain  Level of Care/Admitting Diagnosis ED Disposition    ED Disposition Condition Comment   Admit  Hospital Area: [facility] [[id number]]  Level of Care: Telemetry [5]  Diagnosis: NSTEMI (non-ST elevated myocardial infarction) [facility]) [119147]  Admitting Physician: Oralia Manis [[id number]]  Attending Physician: Oralia Manis [phone]  Estimated length of stay: past midnight tomorrow  Certification:: I certify this patient will need inpatient services for at least 2 midnights  Bed request comments: 2a  PT Class (Do Not Modify): Inpatient [101]  PT Acc Code (Do Not Modify): Private [1]       Medical History Past Medical History:  Diagnosis Date  . GERD (gastroesophageal reflux disease)   . Tobacco abuse     Allergies Allergies  Allergen Reactions  . Penicillins Hives    Has patient had a PCN reaction causing immediate rash, facial/tongue/throat swelling, SOB or lightheadedness with hypotension: Unknown Has patient had a PCN reaction causing severe rash involving mucus membranes or skin necrosis: Unknown Has patient had a PCN reaction that required hospitalization: Unknown Has patient had a PCN reaction occurring within the last 10 years: No If all of the above answers are "NO", then may proceed with Cephalosporin use.  Childhood reported allergy    IV Location/Drains/Wounds Patient Lines/Drains/Airways Status   Active Line/Drains/Airways    Name:   Placement date:   Placement time:   Site:   Days:   Peripheral IV 06/07/18 Left Antecubital   06/07/18    2015    Antecubital   less than 1          Labs/Imaging Results for orders placed or performed during the hospital encounter of 06/07/18 (from the past 48 hour(s))  Basic metabolic panel     Status: Abnormal   Collection Time: 06/07/18  5:06 PM   Result Value Ref Range   Sodium 135 135 - 145 mmol/L   Potassium 3.2 (L) 3.5 - 5.1 mmol/L   Chloride 111 98 - 111 mmol/L   CO2 19 (L) 22 - 32 mmol/L   Glucose, Bld 133 (H) 70 - 99 mg/dL   BUN 10 6 - 20 mg/dL   Creatinine, Ser 4.69 0.44 - 1.00 mg/dL   Calcium 8.7 (L) 8.9 - 10.3 mg/dL   GFR calc non Af Amer >60 >60 mL/min   GFR calc Af Amer >60 >60 mL/min   Anion gap 5 5 - 15    Comment: Performed at [facility], [address] [place], [place] [address]  CBC     Status: None   Collection Time: 06/07/18  5:06 PM  Result Value Ref Range   WBC 10.1 4.0 - 10.5 K/uL   RBC 4.57 3.87 - 5.11 MIL/uL   Hemoglobin 13.3 12.0 - 15.0 g/dL   HCT 84.1 32.4 - 40.1 %   MCV 87.5 80.0 - 100.0 fL   MCH 29.1 26.0 - 34.0 pg   MCHC 33.3 30.0 - 36.0 g/dL   RDW 02.7 25.3 - 66.4 %   Platelets 233 150 - 400 K/uL   nRBC 0.0 0.0 - 0.2 %    Comment: Performed at [facility], [address] [place], [place] [address]  Troponin I - ONCE - STAT     Status: Abnormal   Collection Time: 06/07/18  5:06 PM  Result Value Ref Range  Troponin I 0.28 (HH) <0.03 ng/mL    Comment: CRITICAL RESULT CALLED TO, READ BACK BY AND VERIFIED WITH JANE RYAN AT 1742 06/07/18.PMF Performed at [facility], [address] [place], [place] [address]   hCG, quantitative, pregnancy     Status: None   Collection Time: 06/07/18  5:06 PM  Result Value Ref Range   hCG, Beta Chain, Quant, S <1 <5 mIU/mL    Comment:          GEST. AGE      CONC.  (mIU/mL)   <=1 WEEK        5 - 50     2 WEEKS       50 - 500     3 WEEKS       100 - 10,000     4 WEEKS     1,000 - 30,000     5 WEEKS     3,500 - 115,000   6-8 WEEKS     12,000 - 270,000    12 WEEKS     15,000 - 220,000        FEMALE AND NON-PREGNANT FEMALE:     LESS THAN 5 mIU/mL Performed at [facility], [address] [place], [place] [address]   Protime-INR     Status: None   Collection Time: 06/07/18  8:20 PM  Result Value Ref Range    Prothrombin Time 13.1 11.4 - 15.2 seconds   INR 1.00     Comment: Performed at [facility], [address] [place], [place] [address]  APTT     Status: None   Collection Time: 06/07/18  8:20 PM  Result Value Ref Range   aPTT 28 24 - 36 seconds    Comment: Performed at [facility], [address] [place], [place] [address]  Troponin I - ONCE - STAT     Status: Abnormal   Collection Time: 06/07/18  8:20 PM  Result Value Ref Range   Troponin I 0.28 (HH) <0.03 ng/mL    Comment: CRITICAL VALUE NOTED. VALUE IS CONSISTENT WITH PREVIOUSLY REPORTED/CALLED VALUE / MLK Performed at [facility], [address] [place], [place] [address]   Urine Drug Screen, [facility] only)     Status: None   Collection Time: 06/07/18  9:33 PM  Result Value Ref Range   Tricyclic, Ur Screen NONE DETECTED NONE DETECTED   Amphetamines, Ur Screen NONE DETECTED NONE DETECTED   MDMA (Ecstasy)Ur Screen NONE DETECTED NONE DETECTED   Cocaine Metabolite,Ur [place] NONE DETECTED NONE DETECTED   Opiate, Ur Screen NONE DETECTED NONE DETECTED   Phencyclidine (PCP) Ur S NONE DETECTED NONE DETECTED   Cannabinoid 50 Ng, Ur [place] NONE DETECTED NONE DETECTED   Barbiturates, Ur Screen NONE DETECTED NONE DETECTED   Benzodiazepine, Ur Scrn NONE DETECTED NONE DETECTED   Methadone Scn, Ur NONE DETECTED NONE DETECTED    Comment: (NOTE) Tricyclics + metabolites, urine    Cutoff 1000 ng/mL Amphetamines + metabolites, urine  Cutoff 1000 ng/mL MDMA (Ecstasy), urine              Cutoff 500 ng/mL Cocaine Metabolite, urine          Cutoff 300 ng/mL Opiate + metabolites, urine        Cutoff 300 ng/mL Phencyclidine (PCP), urine         Cutoff 25 ng/mL Cannabinoid, urine  Cutoff 50 ng/mL Barbiturates + metabolites, urine  Cutoff 200 ng/mL Benzodiazepine, urine              Cutoff 200 ng/mL Methadone, urine                   Cutoff 300 ng/mL The urine drug screen provides only a  preliminary, unconfirmed analytical test result and should not be used for non-medical purposes. Clinical consideration and professional judgment should be applied to any positive drug screen result due to possible interfering substances. A more specific alternate chemical method must be used in order to obtain a confirmed analytical result. Gas chromatography / mass spectrometry (GC/MS) is the preferred confirmat ory method. Performed at [facility], [address] [place], [place] [address]    Dg Chest 2 View  Result Date: 06/07/2018 CLINICAL DATA:  Chest pain. EXAM: CHEST - 2 VIEW COMPARISON:  February 06, 2017 FINDINGS: The heart size and mediastinal contours are within normal limits. Both lungs are clear. The visualized skeletal structures are unremarkable. IMPRESSION: No active cardiopulmonary disease. Electronically Signed   By: David  Williams III M.D   On: 06/07/2018 17:21   Ct Angio Chest Pe W And/or Wo Contrast  Result Date: 06/07/2018 CLINICAL DATA:  Chest discomfort EXAM: CT ANGIOGRAPHY CHEST WITH CONTRAST TECHNIQUE: Multidetector CT imaging of the chest was performed using the standard protocol during bolus administration of intravenous contrast. Multiplanar CT i40m57OrlenAltria [id number] 47Marland KitcheOrlenAltria RennGames develop2Kor([place]726Britt tisfactory opacification of the pulmonary arteries to the segmental level. No evidence of pulmonary embolism. Normal heart size. No pericardial effusion. Mediastinum/Nodes: No enlarged mediastinal, hilar, or axillary lymph nodes. Thyroid gland, trachea, and esophagus demonstrate no significant findings. Lungs/Pleura: Lungs are clear. No pleural effusion or pneumothorax. Upper Abdomen: No acute abnormality. Musculoskeletal: No chest wall abnormality. No acute or significant osseous findings. Review of the MIP images confirms  the above findings. IMPRESSION: Negative. No CT evidence for acute pulmonary embolus. Clear lung fields. Electronically Signed   By: Kim  Fujinaga M.D.   On: 06/07/2018 23:07    Pending Labs Unresulted Labs (From admission, onward)    Start     Ordered   06/08/18 0500  CBC  Tomorrow morning,   STAT     06/07/18 2046   06/08/18 0300  Heparin level (unfractionated)  Once-Timed,   STAT     06/07/18 2046   Signed and Held  HIV antibody (Routine Testing)  Once,   R     Signed and Held   Signed and Held  Troponin I - Now Then Q6H  Now then every 6 hours,   R     Signed and Held   Signed and Held  Basic metabolic panel  Tomorrow morning,   R     Signed and Held   Signed and Held  CBC  Tomorrow morning,   R     Signed and Held   Signed and Held  Hemoglobin A1c  Tomorrow morning,   R     Signed and Held          Vitals/Pain Today's Vitals   06/07/18 1830 06/07/18 1930 06/07/18 2018 06/07/18 2021  BP: (!) 122/106 (!) 148/110 (!) 142/99   Pulse: 80 78    Resp: 15 19 18    Temp:      TempSrc:      SpO2: 100% 100%  Weight:      Height:      PainSc:    5     Isolation Precautions No active isolations  Medications Medications  heparin ADULT infusion 100 units/mL (25000 units/250mL sodium chloride 0.45%) (1,200 Units/hr Intravenous New Bag/Given 06/07/18 2130)  nicotine (NICODERM CQ - dosed in mg/24 hours) patch 14 mg (14 mg Transdermal Patch Applied 06/07/18 2310)  nitroGLYCERIN (NITROGLYN) 2 % ointment 0.5 inch (0.5 inches Topical Given 06/07/18 1946)  heparin injection 4,000 Units (4,000 Units Intravenous Given 06/07/18 2130)  iohexol (OMNIPAQUE) 350 MG/ML injection 75 mL (75 mLs Intravenous Contrast Given 06/07/18 2237)    Mobility independent

## 2018-06-07 NOTE — ED Provider Notes (Signed)
[facility] Emergency Department Provider Note ____________________________________________   First MD Initiated Contact with Patient 06/07/18 1842     (approximate)  I have reviewed the triage vital signs and the nursing notes.   HISTORY  Chief Complaint Chest Pain  HPI Sonya [name] is a 37 y.o. female reports no major medical history  Patient reports yesterday history experiencing a chest discomfort feeling a tightness while at work.  Seem to get better when she dressed and worsened when she try to work again.  No fevers or chills.  Felt a little bit short of breath when it initially started but that is resolved.  Got 325 mg of aspirin with EMS.  She reports at the present time that she does feel slightly improved, symptoms are better today but she was worried because she has a strong family history of heart problems and wants to make sure she did have a "heart attack"  Abdominal pain.  No back pain.  No leg swelling.  Does smoke.   No past medical history on file.  There are no active problems to display for this patient.   Past Surgical History:  Procedure Laterality Date  . TUBAL LIGATION      Prior to Admission medications   Medication Sig Start Date End Date Taking? Authorizing Provider  famotidine (PEPCID) 20 MG tablet Take 1 tablet (20 mg total) by mouth 2 (two) times daily. 05/09/18   Willy Eddy, MD  sucralfate (CARAFATE) 1 g tablet Take 1 tablet (1 g total) by mouth 4 (four) times daily -  with meals and at bedtime. 05/09/18 05/09/19  Willy Eddy, MD    Allergies Penicillins  No family history on file.  Social History Social History   Tobacco Use  . Smoking status: Never Smoker  . Smokeless tobacco: Never Used  Substance Use Topics  . Alcohol use: Yes    Comment: occasionally  . Drug use: Not on file    Review of Systems Constitutional: No fever/chills Eyes: No visual changes. ENT: No sore  throat. Cardiovascular: See HPI Respiratory: Denies shortness of breath. Gastrointestinal: No abdominal pain.   Genitourinary: Negative for dysuria.  Reports previous bilateral tubal ligation Musculoskeletal: Negative for back pain. Skin: Negative for rash. Neurological: Negative for headaches, areas of focal weakness or numbness.    ____________________________________________   PHYSICAL EXAM:  VITAL SIGNS: ED Triage Vitals  Enc Vitals Group     BP 06/07/18 1647 (!) 150/98     Pulse Rate 06/07/18 1647 90     Resp 06/07/18 1647 18     Temp 06/07/18 1647 98.3 F (36.8 C)     Temp Source 06/07/18 1647 Oral     SpO2 06/07/18 1647 98 %     Weight 06/07/18 1658 220 lb (99.8 kg)     Height 06/07/18 1658 5\' 11"  (1.803 m)     Head Circumference --      Peak Flow --      Pain Score 06/07/18 1658 9     Pain Loc --      Pain Edu? --      Excl. in GC? --     Constitutional: Alert and oriented. Well appearing and in no acute distress. Eyes: Conjunctivae are normal. Head: Atraumatic. Nose: No congestion/rhinnorhea. Mouth/Throat: Mucous membranes are moist. Neck: No stridor.  Cardiovascular: Normal rate, regular rhythm. Grossly normal heart sounds.  Good peripheral circulation. Respiratory: Normal respiratory effort.  No retractions. Lungs CTAB. Gastrointestinal: Soft and nontender.  No distention. Musculoskeletal: No lower extremity tenderness nor edema. Neurologic:  Normal speech and language. No gross focal neurologic deficits are appreciated.  Skin:  Skin is warm, dry and intact. No rash noted. Psychiatric: Mood and affect are normal. Speech and behavior are normal.  ____________________________________________   LABS (all labs ordered are listed, but only abnormal results are displayed)  Labs Reviewed  BASIC METABOLIC PANEL - Abnormal; Notable for the following components:      Result Value   Potassium 3.2 (*)    CO2 19 (*)    Glucose, Bld 133 (*)    Calcium 8.7 (*)     All other components within normal limits  TROPONIN I - Abnormal; Notable for the following components:   Troponin I 0.28 (*)    All other components within normal limits  TROPONIN I - Abnormal; Notable for the following components:   Troponin I 0.28 (*)    All other components within normal limits  CBC  PROTIME-INR  APTT  HEPARIN LEVEL (UNFRACTIONATED)  CBC  HCG, QUANTITATIVE, PREGNANCY  URINE DRUG SCREEN, QUALITATIVE ([facility] ONLY)   ____________________________________________  EKG  Initial EKG is reviewed and surviving at 1650 Heart rate 90 QRS 80 QTc 440 Normal sinus rhythm, concerning inferolateral T wave inversions could indicate ischemia  Repeat EKG performed at 1932 Of note the patient now reports her pain is improved.  Heart rate 80 QRS 90 QTc 500 Normal sinus rhythm, no T wave abnormalities have improved, no evidence ST elevation ____________________________________________  RADIOLOGY  Dg Chest 2 View  Result Date: 06/07/2018 CLINICAL DATA:  Chest pain. EXAM: CHEST - 2 VIEW COMPARISON:  February 06, 2017 FINDINGS: The heart size and mediastinal contours are within normal limits. Both lungs are clear. The visualized skeletal structures are unremarkable. IMPRESSION: No active cardiopulmonary disease. Electronically Signed   By: Gerome Sam III M.D   On: 06/07/2018 17:21    CT angiogram pending at the time of admission decision.  Dr. Anne Hahn to follow-up on CT angiogram results to exclude pulmonary embolism. ____________________________________________   PROCEDURES  Procedure(s) performed: None  Procedures  Critical Care performed: Yes, see critical care note(s)  CRITICAL CARE Performed by: Sharyn Creamer   Total critical care time: 37 minutes  Critical care time was exclusive of separately billable procedures and treating other patients.  Critical care was necessary to treat or prevent imminent or life-threatening deterioration.  Critical care was  time spent personally by me on the following activities: development of treatment plan with patient and/or surrogate as well as nursing, discussions with consultants, evaluation of patient's response to treatment, examination of patient, obtaining history from patient or surrogate, ordering and performing treatments and interventions, ordering and review of laboratory studies, ordering and review of radiographic studies, pulse oximetry and re-evaluation of patient's condition.  ____________________________________________   INITIAL IMPRESSION / ASSESSMENT AND PLAN / ED COURSE  Pertinent labs & imaging results that were available during my care of the patient were reviewed by me and considered in my medical decision making (see chart for details).   Differential diagnosis includes, but is not limited to, ACS, aortic dissection, pulmonary embolism, cardiac tamponade, pneumothorax, pneumonia, pericarditis, myocarditis, GI-related causes including esophagitis/gastritis, and musculoskeletal chest wall pain.    ----------------------------------------- 9:10 PM on 06/07/2018 -----------------------------------------  Patient being initiated on heparin drip for concerns of acute coronary syndrome and non-ST elevation MI.  Patient high risk by heart score.  Asymptomatic at this time, pain improved and she is resting comfortably.  Awaiting CT angiogram.  Understands plan of care and admission.  Discussed case and care and plan of care including heparin drip and agreement with Dr. Lennette BihariKohn of cardiology.       ____________________________________________   FINAL CLINICAL IMPRESSION(S) / ED DIAGNOSES  Final diagnoses:  Chest pain with high risk of acute coronary syndrome        Note:  This document was prepared using Dragon voice recognition software and may include unintentional dictation errors       Sharyn CreamerQuale, Kartel Wolbert, MD 06/07/18 2111

## 2018-06-07 NOTE — ED Triage Notes (Signed)
Pt states that she had similar pain last night, states that this afternoon she started having center chest pain that radiated outward and states that when she takes deep breath at times it is worse.

## 2018-06-07 NOTE — H&P (Addendum)
[facility] [place] [facility]   PATIENT NAME: Sonya [name]    MR#:  [id number]  DATE OF BIRTH:  03-01-1981  DATE OF ADMISSION:  06/07/2018  PRIMARY CARE PHYSICIAN: [facility]   REQUESTING/REFERRING PHYSICIAN: Fanny Bien, MD  CHIEF COMPLAINT:   Chief Complaint  Patient presents with  . Chest Pain    HISTORY OF PRESENT ILLNESS:  Sonya [name]  is a 37 y.o. female who presents with chief complaint as above.  Patient states that she developed significant chest pressure within the last 24 hours.  This pain happened initially while she was at work, then resolved throughout the evening, then recurred again on the day of presentation to the ED.  She had an elevated troponin on evaluation in the ED.  She has no prior cardiac history, but she does have family history of cardiac disease at a young age, including heart attack in her mother when she was in her 26s.  CT scan in the ED ruled out PE.  Hospitalist called for admission  PAST MEDICAL HISTORY:   Past Medical History:  Diagnosis Date  . GERD (gastroesophageal reflux disease)   . Tobacco abuse      PAST SURGICAL HISTORY:   Past Surgical History:  Procedure Laterality Date  . TUBAL LIGATION       SOCIAL HISTORY:   Social History   Tobacco Use  . Smoking status: Current Every Day Smoker  . Smokeless tobacco: Never Used  Substance Use Topics  . Alcohol use: Yes    Comment: occasionally     FAMILY HISTORY:   Family History  Problem Relation Age of Onset  . Heart attack Mother   . Heart attack Father      DRUG ALLERGIES:   Allergies  Allergen Reactions  . Penicillins Hives    Has patient had a PCN reaction causing immediate rash, facial/tongue/throat swelling, SOB or lightheadedness with hypotension: Unknown Has patient had a PCN reaction causing severe rash involving mucus membranes or skin necrosis: Unknown Has patient had a PCN reaction that required  hospitalization: Unknown Has patient had a PCN reaction occurring within the last 10 years: No If all of the above answers are "NO", then may proceed with Cephalosporin use.  Childhood reported allergy    MEDICATIONS AT HOME:   Prior to Admission medications   Medication Sig Start Date End Date Taking? Authorizing Provider  famotidine (PEPCID) 20 MG tablet Take 1 tablet (20 mg total) by mouth 2 (two) times daily. Patient not taking: Reported on 06/07/2018 05/09/18   Willy Eddy, MD  sucralfate (CARAFATE) 1 g tablet Take 1 tablet (1 g total) by mouth 4 (four) times daily -  with meals and at bedtime. Patient not taking: Reported on 06/07/2018 05/09/18 05/09/19  Willy Eddy, MD    REVIEW OF SYSTEMS:  Review of Systems  Constitutional: Negative for chills, fever, malaise/fatigue and weight loss.  HENT: Negative for ear pain, hearing loss and tinnitus.   Eyes: Negative for blurred vision, double vision, pain and redness.  Respiratory: Negative for cough, hemoptysis and shortness of breath.   Cardiovascular: Positive for chest pain. Negative for palpitations, orthopnea and leg swelling.  Gastrointestinal: Negative for abdominal pain, constipation, diarrhea, nausea and vomiting.  Genitourinary: Negative for dysuria, frequency and hematuria.  Musculoskeletal: Negative for back pain, joint pain and neck pain.  Skin:       No acne, rash, or lesions  Neurological: Negative for dizziness, tremors, focal weakness  and weakness.  Endo/Heme/Allergies: Negative for polydipsia. Does not bruise/bleed easily.  Psychiatric/Behavioral: Negative for depression. The patient is not nervous/anxious and does not have insomnia.      VITAL SIGNS:   Vitals:   06/07/18 1809 06/07/18 1830 06/07/18 1930 06/07/18 2018  BP:  (!) 122/106 (!) 148/110 (!) 142/99  Pulse: 88 80 78   Resp: 10 15 19 18   Temp:      TempSrc:      SpO2: 100% 100% 100%   Weight:      Height:       Wt Readings from Last  3 Encounters:  06/07/18 99.8 kg  05/09/18 101.6 kg  09/11/17 106.1 kg    PHYSICAL EXAMINATION:  Physical Exam  Vitals reviewed. Constitutional: She is oriented to person, place, and time. She appears well-developed and well-nourished. No distress.  HENT:  Head: Normocephalic and atraumatic.  Mouth/Throat: Oropharynx is clear and moist.  Eyes: Pupils are equal, round, and reactive to light. Conjunctivae and EOM are normal. No scleral icterus.  Neck: Normal range of motion. Neck supple. No JVD present. No thyromegaly present.  Cardiovascular: Normal rate, regular rhythm and intact distal pulses. Exam reveals no gallop and no friction rub.  No murmur heard. Respiratory: Effort normal and breath sounds normal. No respiratory distress. She has no wheezes. She has no rales.  GI: Soft. Bowel sounds are normal. She exhibits no distension. There is no abdominal tenderness.  Musculoskeletal: Normal range of motion.        General: No edema.     Comments: No arthritis, no gout  Lymphadenopathy:    She has no cervical adenopathy.  Neurological: She is alert and oriented to person, place, and time. No cranial nerve deficit.  No dysarthria, no aphasia  Skin: Skin is warm and dry. No rash noted. No erythema.  Psychiatric: She has a normal mood and affect. Her behavior is normal. Judgment and thought content normal.    LABORATORY PANEL:   CBC Recent Labs  Lab 06/07/18 1706  WBC 10.1  HGB 13.3  HCT 40.0  PLT 233   ------------------------------------------------------------------------------------------------------------------  Chemistries  Recent Labs  Lab 06/07/18 1706  NA 135  K 3.2*  CL 111  CO2 19*  GLUCOSE 133*  BUN 10  CREATININE 0.68  CALCIUM 8.7*   ------------------------------------------------------------------------------------------------------------------  Cardiac Enzymes Recent Labs  Lab 06/07/18 2020  TROPONINI 0.28*    ------------------------------------------------------------------------------------------------------------------  RADIOLOGY:  Dg Chest 2 View  Result Date: 06/07/2018 CLINICAL DATA:  Chest pain. EXAM: CHEST - 2 VIEW COMPARISON:  February 06, 2017 FINDINGS: The heart size and mediastinal contours are within normal limits. Both lungs are clear. The visualized skeletal structures are unremarkable. IMPRESSION: No active cardiopulmonary disease. Electronically Signed   By: Gerome Samavid  Williams III M.D   On: 06/07/2018 17:21   Ct Angio Chest Pe W And/or Wo Contrast  Result Date: 06/07/2018 CLINICAL DATA:  Chest discomfort EXAM: CT ANGIOGRAPHY CHEST WITH CONTRAST TECHNIQUE: Multidetector CT imaging of the chest was performed using the standard protocol during bolus administration of intravenous contrast. Multiplanar CT image reconstructions and MIPs were obtained to evaluate the vascular anatomy. CONTRAST:  75mL OMNIPAQUE IOHEXOL 350 MG/ML SOLN COMPARISON:  Chest x-ray 06/07/2018 FINDINGS: Cardiovascular: Satisfactory opacification of the pulmonary arteries to the segmental level. No evidence of pulmonary embolism. Normal heart size. No pericardial effusion. Mediastinum/Nodes: No enlarged mediastinal, hilar, or axillary lymph nodes. Thyroid gland, trachea, and esophagus demonstrate no significant findings. Lungs/Pleura: Lungs are clear. No pleural effusion or  pneumothorax. Upper Abdomen: No acute abnormality. Musculoskeletal: No chest wall abnormality. No acute or significant osseous findings. Review of the MIP images confirms the above findings. IMPRESSION: Negative. No CT evidence for acute pulmonary embolus. Clear lung fields. Electronically Signed   By: Jasmine Pang M.D.   On: 06/07/2018 23:07    EKG:   Orders placed or performed during the hospital encounter of 06/07/18  . EKG 12-Lead  . EKG 12-Lead  . ED EKG within 10 minutes  . ED EKG within 10 minutes  . ED EKG  . ED EKG    IMPRESSION AND  PLAN:  Principal Problem:   NSTEMI (non-ST elevated myocardial infarction) (HCC) -trend troponins, heparin drip started, echocardiogram and cardiology consult placed by ED physician to Dr. Welton Flakes, Nitropaste for symptom control Active Problems:   GERD (gastroesophageal reflux disease) -home dose PPI   Tobacco abuse -nicotine patch, counseled smoking cessation  Chart review performed and case discussed with ED provider. Labs, imaging and/or ECG reviewed by provider and discussed with patient/family. Management plans discussed with the patient and/or family.  DVT PROPHYLAXIS: Systemic anticoagulation  GI PROPHYLAXIS:  PPI  ADMISSION STATUS: Inpatient     CODE STATUS: Full  TOTAL TIME TAKING CARE OF THIS PATIENT: 45 minutes.   Valen Mascaro FIELDING 06/07/2018, 11:27 PM  [facility]  Office  [phone]  CC: Primary care physician; [facility]  Note:  This document was prepared using Conservation officer, historic buildings and may include unintentional dictation errors.

## 2018-06-07 NOTE — Progress Notes (Signed)
ANTICOAGULATION CONSULT NOTE - Initial Consult  Pharmacy Consult for Heparin Indication: chest pain/ACS  Allergies  Allergen Reactions  . Penicillins Hives    Has patient had a PCN reaction causing immediate rash, facial/tongue/throat swelling, SOB or lightheadedness with hypotension: Unknown Has patient had a PCN reaction causing severe rash involving mucus membranes or skin necrosis: Unknown Has patient had a PCN reaction that required hospitalization: Unknown Has patient had a PCN reaction occurring within the last 10 years: No If all of the above answers are "NO", then may proceed with Cephalosporin use.  Childhood reported allergy    Patient Measurements: Height: 5\' 11"  (180.3 cm) Weight: 220 lb (99.8 kg) IBW/kg (Calculated) : 70.8 Heparin Dosing Weight: 91.9 kg  Vital Signs: Temp: 98.3 F (36.8 C) (12/13 1647) Temp Source: Oral (12/13 1647) BP: 142/99 (12/13 2018) Pulse Rate: 78 (12/13 1930)  Labs: Recent Labs    06/07/18 1706 06/07/18 2020  HGB 13.3  --   HCT 40.0  --   PLT 233  --   APTT  --  28  LABPROT  --  13.1  INR  --  1.00  CREATININE 0.68  --   TROPONINI 0.28*  --     Estimated Creatinine Clearance: 124 mL/min (by C-G formula based on SCr of 0.68 mg/dL).   Medical History: No past medical history on file.  Assessment: Patient is a 37yo female admitted for chest pain. Pharmacy consulted for Heparin dosing.  Goal of Therapy:  Heparin level 0.3-0.7 units/ml Monitor platelets by anticoagulation protocol: Yes   Plan:  Give 4000 units bolus x 1 Start heparin infusion at 1200 units/hr Check anti-Xa level in 6 hours and daily while on heparin Continue to monitor H&H and platelets  Clovia CuffLisa Wannetta Langland, PharmD, BCPS 06/07/2018 8:48 PM

## 2018-06-08 ENCOUNTER — Other Ambulatory Visit: Payer: Self-pay

## 2018-06-08 ENCOUNTER — Inpatient Hospital Stay: Admit: 2018-06-08 | Payer: Medicaid Other

## 2018-06-08 ENCOUNTER — Inpatient Hospital Stay
Admit: 2018-06-08 | Discharge: 2018-06-08 | Disposition: A | Discharge status: Home / Self Care | Payer: Medicaid Other | Attending: Internal Medicine | Admitting: Internal Medicine

## 2018-06-08 LAB — BASIC METABOLIC PANEL
Anion gap: 3 — ABNORMAL LOW (ref 5–15)
BUN: 11 mg/dL (ref 6–20)
CO2: 22 mmol/L (ref 22–32)
Calcium: 8.2 mg/dL — ABNORMAL LOW (ref 8.9–10.3)
Chloride: 111 mmol/L (ref 98–111)
Creatinine, Ser: 0.65 mg/dL (ref 0.44–1.00)
GFR calc Af Amer: >60 mL/min (ref >60)
GFR calc non Af Amer: >60 mL/min (ref >60)
Glucose, Bld: 108 mg/dL — ABNORMAL HIGH (ref 70–99)
Potassium: 3.4 mmol/L — ABNORMAL LOW (ref 3.5–5.1)
Sodium: 136 mmol/L (ref 135–145)

## 2018-06-08 LAB — CBC
HCT: 37.9 % (ref 36.0–46.0)
Hemoglobin: 12.5 g/dL (ref 12.0–15.0)
MCH: 29.1 pg (ref 26.0–34.0)
MCHC: 33 g/dL (ref 30.0–36.0)
MCV: 88.3 fL (ref 80.0–100.0)
Platelets: 216 10*3/uL (ref 150–400)
RBC: 4.29 MIL/uL (ref 3.87–5.11)
RDW: 14.2 % (ref 11.5–15.5)
WBC: 11.1 10*3/uL — ABNORMAL HIGH (ref 4.0–10.5)
nRBC: 0 % (ref 0.0–0.2)

## 2018-06-08 LAB — TROPONIN I
Troponin I: 0.26 ng/mL (ref <0.03)
Troponin I: 0.27 ng/mL (ref <0.03)
Troponin I: 0.24 ng/mL (ref <0.03)

## 2018-06-08 LAB — HEMOGLOBIN A1C
Hgb A1c MFr Bld: 5.5 % (ref 4.8–5.6)
Mean Plasma Glucose: 111.15 mg/dL

## 2018-06-08 LAB — HEPARIN LEVEL (UNFRACTIONATED)
Heparin Unfractionated: 0.33 IU/mL (ref 0.30–0.70)
Heparin Unfractionated: 0.31 IU/mL (ref 0.30–0.70)

## 2018-06-08 MED ORDER — NITROGLYCERIN 2 % TD OINT: 0.5000 [in_us] | TOPICAL_OINTMENT | Freq: Four times a day (QID) | TRANSDERMAL | Status: DC | PRN

## 2018-06-08 MED ORDER — ZOLPIDEM TARTRATE 5 MG PO TABS
5.0000 mg | ORAL_TABLET | Freq: Every evening | ORAL | Status: DC | PRN
  Administered 2018-06-08 – 2018-06-09 (×2): 5 mg via ORAL
  Filled 2018-06-08 (×2): qty 1

## 2018-06-08 MED ORDER — ACETAMINOPHEN 325 MG PO TABS
650.0000 mg | ORAL_TABLET | Freq: Four times a day (QID) | ORAL | Status: DC | PRN
  Administered 2018-06-08 – 2018-06-10 (×2): 650 mg via ORAL
  Filled 2018-06-08 (×2): qty 2

## 2018-06-08 MED ORDER — PANTOPRAZOLE SODIUM 40 MG PO TBEC
40.0000 mg | DELAYED_RELEASE_TABLET | Freq: Every day | ORAL | Status: DC
  Administered 2018-06-08 – 2018-06-11 (×3): 40 mg via ORAL
  Filled 2018-06-08 (×3): qty 1

## 2018-06-08 MED ORDER — POTASSIUM CHLORIDE CRYS ER 20 MEQ PO TBCR
40.0000 meq | EXTENDED_RELEASE_TABLET | Freq: Once | ORAL | Status: AC
  Administered 2018-06-08: 40 meq via ORAL
  Filled 2018-06-08: qty 2

## 2018-06-08 MED ORDER — ACETAMINOPHEN 650 MG RE SUPP: 650.0000 mg | Freq: Four times a day (QID) | RECTAL | Status: DC | PRN

## 2018-06-08 MED ORDER — OXYCODONE HCL 5 MG PO TABS
5.0000 mg | ORAL_TABLET | ORAL | Status: DC | PRN
  Filled 2018-06-08: qty 1

## 2018-06-08 MED ORDER — ONDANSETRON HCL 4 MG PO TABS: 4.0000 mg | ORAL_TABLET | Freq: Four times a day (QID) | ORAL | Status: DC | PRN

## 2018-06-08 MED ORDER — ONDANSETRON HCL 4 MG/2ML IJ SOLN: 4.0000 mg | Freq: Four times a day (QID) | INTRAMUSCULAR | Status: DC | PRN

## 2018-06-08 MED ORDER — BUTALBITAL-APAP-CAFFEINE 50-325-40 MG PO TABS
1.0000 | ORAL_TABLET | Freq: Four times a day (QID) | ORAL | Status: DC | PRN
  Administered 2018-06-08 – 2018-06-09 (×3): 1 via ORAL
  Filled 2018-06-08 (×3): qty 1

## 2018-06-08 NOTE — Plan of Care (Signed)
  Problem: Health Behavior/Discharge Planning: Goal: Ability to manage health-related needs will improve Outcome: Progressing   Problem: Pain Managment: Goal: General experience of comfort will improve Outcome: Progressing   Problem: Safety: Goal: Ability to remain free from injury will improve Outcome: Progressing   

## 2018-06-08 NOTE — Progress Notes (Addendum)
Pt was admitted on the floor with no signs of distress. Alerted x 4. VSS. Fall safety plans reviewed with pts. Will continue to monitor.  Update 0630: Pt complained of 6 out 10 haeadache. Pt just took her tylenol 0129 this morning. Notify prime. Will continue to monitor.  Update [id number]: Dr. Sheryle Hailiamond Fioricet, Esgic 50-325-40 mg 1 tablet for headache every 6 hours.  Will continue to monitor.

## 2018-06-08 NOTE — Progress Notes (Signed)
ANTICOAGULATION CONSULT NOTE - Initial Consult  Pharmacy Consult for Heparin Indication: chest pain/ACS  Allergies  Allergen Reactions  . Penicillins Hives    Has patient had a PCN reaction causing immediate rash, facial/tongue/throat swelling, SOB or lightheadedness with hypotension: Unknown Has patient had a PCN reaction causing severe rash involving mucus membranes or skin necrosis: Unknown Has patient had a PCN reaction that required hospitalization: Unknown Has patient had a PCN reaction occurring within the last 10 years: No If all of the above answers are "NO", then may proceed with Cephalosporin use.  Childhood reported allergy    Patient Measurements: Height: 5\' 11"  (180.3 cm) Weight: 225 lb 8 oz (102.3 kg) IBW/kg (Calculated) : 70.8 Heparin Dosing Weight: 91.9 kg  Vital Signs: Temp: 98.7 F (37.1 C) (12/14 0042) Temp Source: Oral (12/14 0042) BP: 137/97 (12/14 0042) Pulse Rate: 80 (12/14 0042)  Labs: Recent Labs    06/07/18 1706 06/07/18 2020 06/08/18 0045 06/08/18 0329  HGB 13.3  --   --  12.5  HCT 40.0  --   --  37.9  PLT 233  --   --  216  APTT  --  28  --   --   LABPROT  --  13.1  --   --   INR  --  1.00  --   --   HEPARINUNFRC  --   --   --  0.33  CREATININE 0.68  --   --  0.65  TROPONINI 0.28* 0.28* 0.26*  --     Estimated Creatinine Clearance: 125.5 mL/min (by C-G formula based on SCr of 0.65 mg/dL).   Medical History: Past Medical History:  Diagnosis Date  . GERD (gastroesophageal reflux disease)   . Tobacco abuse     Assessment: Patient is a 37yo female admitted for chest pain. Pharmacy consulted for Heparin dosing.  Goal of Therapy:  Heparin level 0.3-0.7 units/ml Monitor platelets by anticoagulation protocol: Yes   Plan:  Give 4000 units bolus x 1 Start heparin infusion at 1200 units/hr Check anti-Xa level in 6 hours and daily while on heparin Continue to monitor H&H and platelets   12/14 0330 heparin level 0.33. Continue  current regimen. Recheck in 6 hours to confirm.  Fulton Reek.Matt Quadarius Henton, PharmD, BCPS  06/08/18 4:47 AM

## 2018-06-08 NOTE — Progress Notes (Signed)
[facility] [place] [facility]   PATIENT NAME: Sonya [name]    MR#:  [id number]  DATE OF BIRTH:  1980-11-09  SUBJECTIVE:   Patient without chest pain overnight  REVIEW OF SYSTEMS:    Review of Systems  Constitutional: Negative for fever, chills weight loss HENT: Negative for ear pain, nosebleeds, congestion, facial swelling, rhinorrhea, neck pain, neck stiffness and ear discharge.   Respiratory: Negative for cough, shortness of breath, wheezing  Cardiovascular: Negative for chest pain, palpitations and leg swelling.  Gastrointestinal: Negative for heartburn, abdominal pain, vomiting, diarrhea or consitpation Genitourinary: Negative for dysuria, urgency, frequency, hematuria Musculoskeletal: Negative for back pain or joint pain Neurological: Negative for dizziness, seizures, syncope, focal weakness,  numbness and headaches.  Hematological: Does not bruise/bleed easily.  Psychiatric/Behavioral: Negative for hallucinations, confusion, dysphoric mood    Tolerating Diet: yes      DRUG ALLERGIES:   Allergies  Allergen Reactions  . Penicillins Hives    Has patient had a PCN reaction causing immediate rash, facial/tongue/throat swelling, SOB or lightheadedness with hypotension: Unknown Has patient had a PCN reaction causing severe rash involving mucus membranes or skin necrosis: Unknown Has patient had a PCN reaction that required hospitalization: Unknown Has patient had a PCN reaction occurring within the last 10 years: No If all of the above answers are "NO", then may proceed with Cephalosporin use.  Childhood reported allergy    VITALS:  Blood pressure 115/86, pulse 77, temperature 98.2 F (36.8 C), temperature source Oral, resp. rate 17, height 5\' 11"  (1.803 m), weight 102.3 kg, SpO2 99 %.  PHYSICAL EXAMINATION:  Constitutional: Appears well-developed and well-nourished. No distress. HENT: Normocephalic. Marland Kitchen Oropharynx is clear and moist.  Eyes:  Conjunctivae and EOM are normal. PERRLA, no scleral icterus.  Neck: Normal ROM. Neck supple. No JVD. No tracheal deviation. CVS: RRR, S1/S2 +, no murmurs, no gallops, no carotid bruit.  Pulmonary: Effort and breath sounds normal, no stridor, rhonchi, wheezes, rales.  Abdominal: Soft. BS +,  no distension, tenderness, rebound or guarding.  Musculoskeletal: Normal range of motion. No edema and no tenderness.  Neuro: Alert. CN 2-12 grossly intact. No focal deficits. Skin: Skin is warm and dry. No rash noted. Psychiatric: Normal mood and affect.      LABORATORY PANEL:   CBC Recent Labs  Lab 06/08/18 0329  WBC 11.1*  HGB 12.5  HCT 37.9  PLT 216   ------------------------------------------------------------------------------------------------------------------  Chemistries  Recent Labs  Lab 06/08/18 0329  NA 136  K 3.4*  CL 111  CO2 22  GLUCOSE 108*  BUN 11  CREATININE 0.65  CALCIUM 8.2*   ------------------------------------------------------------------------------------------------------------------  Cardiac Enzymes Recent Labs  Lab 06/07/18 2020 06/08/18 0045 06/08/18 0633  TROPONINI 0.28* 0.26* 0.27*   ------------------------------------------------------------------------------------------------------------------  RADIOLOGY:  Dg Chest 2 View  Result Date: 06/07/2018 CLINICAL DATA:  Chest pain. EXAM: CHEST - 2 VIEW COMPARISON:  February 06, 2017 FINDINGS: The heart size and mediastinal contours are within normal limits. Both lungs are clear. The visualized skeletal structures are unremarkable. IMPRESSION: No active cardiopulmonary disease. Electronically Signed   By: Gerome Sam III M.D   On: 06/07/2018 17:21   Ct Angio Chest Pe W And/or Wo Contrast  Result Date: 06/07/2018 CLINICAL DATA:  Chest discomfort EXAM: CT ANGIOGRAPHY CHEST WITH CONTRAST TECHNIQUE: Multidetector CT imaging of the chest was performed using the standard protocol during bolus  administration of intravenous contrast. Multiplanar CT image reconstructions and MIPs were obtained to evaluate the vascular anatomy. CONTRAST:  75mL OMNIPAQUE IOHEXOL 350 MG/ML SOLN COMPARISON:  Chest x-ray 06/07/2018 FINDINGS: Cardiovascular: Satisfactory opacification of the pulmonary arteries to the segmental level. No evidence of pulmonary embolism. Normal heart size. No pericardial effusion. Mediastinum/Nodes: No enlarged mediastinal, hilar, or axillary lymph nodes. Thyroid gland, trachea, and esophagus demonstrate no significant findings. Lungs/Pleura: Lungs are clear. No pleural effusion or pneumothorax. Upper Abdomen: No acute abnormality. Musculoskeletal: No chest wall abnormality. No acute or significant osseous findings. Review of the MIP images confirms the above findings. IMPRESSION: Negative. No CT evidence for acute pulmonary embolus. Clear lung fields. Electronically Signed   By: Jasmine PangKim  Fujinaga M.D.   On: 06/07/2018 23:07     ASSESSMENT AND PLAN:   37 year old female with a history of tobacco dependence who presented with chest pain.  1.  Non-STEMI: Troponin max 0.28 patient for cardiac catheterization on Monday  Continue heparin drip Discontinue nitroglycerin it is giving the patient headache Patient needs aspirin Add beta-blocker if blood pressure and heart rate tolerate.  2.  Hypokalemia: Replete  3.Tobacco dependence: Patient is encouraged to quit smoking. Counseling was provided for 4 minutes. 4.  GERD: Continue PPI  Management plans discussed with the patient and she is in agreement.  CODE STATUS: Full  TOTAL TIME TAKING CARE OF THIS PATIENT: 28 minutes.     POSSIBLE D/C Monday or Tuesday, DEPENDING ON CLINICAL CONDITION.   Brenleigh Collet M.D on 06/08/2018 at 12:41 PM  Between 7am to 6pm - Pager - [phone] After 6pm go to [email or web address] - Social research officer, [email or web address] [email or web address]  [facility]  Office  [phone]  CC: Primary care physician; [facility]  Note: This dictation was prepared with Nurse, children'sDragon dictation along with smaller phrase technology. Any transcriptional errors that result from this process are unintentional.

## 2018-06-08 NOTE — Progress Notes (Addendum)
Pt requesting something to help with sleep. Notify prime. Dr. Anne HahnWillis ordered Ambien 5 mg oral at bedtime as needed for sleep. Will continue to monitor.  Update 2240: Ambien was administered. Will continue to monitor.

## 2018-06-08 NOTE — Consult Note (Signed)
Sonya [name] is a 37 y.o. female  [id number]  Primary Cardiologist: Adrian Blackwater Reason for Consultation: Chest pain and elevated troponin acute coronary syndrome  HPI: This is a 37 year old African-American female with a past medical history of 1 pack/day smoking and acid reflux presented to the hospital with chest pain last night about 7 PM associated with shortness of breath and diaphoresis and EKG changes which resolved with nitrates.  She had elevated troponin and I was asked to evaluate the patient.  Patient has no further chest pain at this time.  Patient was started on IV heparin Nitropaste aspirin and is feeling much better.   Review of Systems: No orthopnea PND and leg swelling.   Past Medical History:  Diagnosis Date  . GERD (gastroesophageal reflux disease)   . Tobacco abuse     Medications Prior to Admission  Medication Sig Dispense Refill  . famotidine (PEPCID) 20 MG tablet Take 1 tablet (20 mg total) by mouth 2 (two) times daily. (Patient not taking: Reported on 06/07/2018) 60 tablet 1  . sucralfate (CARAFATE) 1 g tablet Take 1 tablet (1 g total) by mouth 4 (four) times daily -  with meals and at bedtime. (Patient not taking: Reported on 06/07/2018) 120 tablet 1     . nicotine  14 mg Transdermal Daily  . pantoprazole  40 mg Oral Daily  . potassium chloride  40 mEq Oral Once    Infusions: . heparin 1,200 Units/hr (06/08/18 0131)    Allergies  Allergen Reactions  . Penicillins Hives    Has patient had a PCN reaction causing immediate rash, facial/tongue/throat swelling, SOB or lightheadedness with hypotension: Unknown Has patient had a PCN reaction causing severe rash involving mucus membranes or skin necrosis: Unknown Has patient had a PCN reaction that required hospitalization: Unknown Has patient had a PCN reaction occurring within the last 10 years: No If all of the above answers are "NO", then may proceed with Cephalosporin use.  Childhood reported  allergy    Social History   Socioeconomic History  . Marital status: Married    Spouse name: Not on file  . Number of children: Not on file  . Years of education: Not on file  . Highest education level: Not on file  Occupational History  . Not on file  Social Needs  . Financial resource strain: Not on file  . Food insecurity:    Worry: Not on file    Inability: Not on file  . Transportation needs:    Medical: Not on file    Non-medical: Not on file  Tobacco Use  . Smoking status: Current Every Day Smoker  . Smokeless tobacco: Never Used  Substance and Sexual Activity  . Alcohol use: Yes    Comment: occasionally  . Drug use: Not on file  . Sexual activity: Not on file  Lifestyle  . Physical activity:    Days per week: Not on file    Minutes per session: Not on file  . Stress: Not on file  Relationships  . Social connections:    Talks on phone: Not on file    Gets together: Not on file    Attends religious service: Not on file    Active member of club or organization: Not on file    Attends meetings of clubs or organizations: Not on file    Relationship status: Not on file  . Intimate partner violence:    Fear of current or ex partner: Not  on file    Emotionally abused: Not on file    Physically abused: Not on file    Forced sexual activity: Not on file  Other Topics Concern  . Not on file  Social History Narrative  . Not on file    Family History  Problem Relation Age of Onset  . Heart attack Mother   . Heart attack Father     PHYSICAL EXAM: Vitals:   06/08/18 0537 06/08/18 0825  BP: 117/83 115/86  Pulse: 79 77  Resp: 17   Temp: 98.6 F (37 C) 98.2 F (36.8 C)  SpO2: 98% 99%     Intake/Output Summary (Last 24 hours) at 06/08/2018 0852 Last data filed at 06/08/2018 0531 Gross per 24 hour  Intake 60 ml  Output 300 ml  Net -240 ml    General:  Well appearing. No respiratory difficulty HEENT: normal Neck: supple. no JVD. Carotids 2+ bilat;  no bruits. No lymphadenopathy or thryomegaly appreciated. Cor: PMI nondisplaced. Regular rate & rhythm. No rubs, gallops or murmurs. Lungs: clear Abdomen: soft, nontender, nondistended. No hepatosplenomegaly. No bruits or masses. Good bowel sounds. Extremities: no cyanosis, clubbing, rash, edema Neuro: alert & oriented x 3, cranial nerves grossly intact. moves all 4 extremities w/o difficulty. Affect pleasant.  ECG: Normal sinus rhythm with nonspecific ST-T changes which resolved with nitrates.  Results for orders placed or performed during the hospital encounter of 06/07/18 (from the past 24 hour(s))  Basic metabolic panel     Status: Abnormal   Collection Time: 06/07/18  5:06 PM  Result Value Ref Range   Sodium 135 135 - 145 mmol/L   Potassium 3.2 (L) 3.5 - 5.1 mmol/L   Chloride 111 98 - 111 mmol/L   CO2 19 (L) 22 - 32 mmol/L   Glucose, Bld 133 (H) 70 - 99 mg/dL   BUN 10 6 - 20 mg/dL   Creatinine, Ser 9.560.68 0.44 - 1.00 mg/dL   Calcium 8.7 (L) 8.9 - 10.3 mg/dL   GFR calc non Af Amer >60 >60 mL/min   GFR calc Af Amer >60 >60 mL/min   Anion gap 5 5 - 15  CBC     Status: None   Collection Time: 06/07/18  5:06 PM  Result Value Ref Range   WBC 10.1 4.0 - 10.5 K/uL   RBC 4.57 3.87 - 5.11 MIL/uL   Hemoglobin 13.3 12.0 - 15.0 g/dL   HCT 21.340.0 08.636.0 - 57.846.0 %   MCV 87.5 80.0 - 100.0 fL   MCH 29.1 26.0 - 34.0 pg   MCHC 33.3 30.0 - 36.0 g/dL   RDW 46.914.1 62.911.5 - 52.815.5 %   Platelets 233 150 - 400 K/uL   nRBC 0.0 0.0 - 0.2 %  Troponin I - ONCE - STAT     Status: Abnormal   Collection Time: 06/07/18  5:06 PM  Result Value Ref Range   Troponin I 0.28 (HH) <0.03 ng/mL  hCG, quantitative, pregnancy     Status: None   Collection Time: 06/07/18  5:06 PM  Result Value Ref Range   hCG, Beta Chain, Quant, S <1 <5 mIU/mL  Protime-INR     Status: None   Collection Time: 06/07/18  8:20 PM  Result Value Ref Range   Prothrombin Time 13.1 11.4 - 15.2 seconds   INR 1.00   APTT     Status: None    Collection Time: 06/07/18  8:20 PM  Result Value Ref Range   aPTT 28 24 -  36 seconds  Troponin I - ONCE - STAT     Status: Abnormal   Collection Time: 06/07/18  8:20 PM  Result Value Ref Range   Troponin I 0.28 (HH) <0.03 ng/mL  Urine Drug Screen, Qualitative ([facility] only)     Status: None   Collection Time: 06/07/18  9:33 PM  Result Value Ref Range   Tricyclic, Ur Screen NONE DETECTED NONE DETECTED   Amphetamines, Ur Screen NONE DETECTED NONE DETECTED   MDMA (Ecstasy)Ur Screen NONE DETECTED NONE DETECTED   Cocaine Metabolite,Ur [place] NONE DETECTED NONE DETECTED   Opiate, Ur Screen NONE DETECTED NONE DETECTED   Phencyclidine (PCP) Ur S NONE DETECTED NONE DETECTED   Cannabinoid 50 Ng, Ur  NONE DETECTED NONE DETECTED   Barbiturates, Ur Screen NONE DETECTED NONE DETECTED   Benzodiazepine, Ur Scrn NONE DETECTED NONE DETECTED   Methadone Scn, Ur NONE DETECTED NONE DETECTED  Troponin I - Now Then Q6H     Status: Abnormal   Collection Time: 06/08/18 12:45 AM  Result Value Ref Range   Troponin I 0.26 (HH) <0.03 ng/mL  Heparin level (unfractionated)     Status: None   Collection Time: 06/08/18  3:29 AM  Result Value Ref Range   Heparin Unfractionated 0.33 0.30 - 0.70 IU/mL  CBC     Status: Abnormal   Collection Time: 06/08/18  3:29 AM  Result Value Ref Range   WBC 11.1 (H) 4.0 - 10.5 K/uL   RBC 4.29 3.87 - 5.11 MIL/uL   Hemoglobin 12.5 12.0 - 15.0 g/dL   HCT 40.9 81.1 - 91.4 %   MCV 88.3 80.0 - 100.0 fL   MCH 29.1 26.0 - 34.0 pg   MCHC 33.0 30.0 - 36.0 g/dL   RDW 78.2 95.6 - 21.3 %   Platelets 216 150 - 400 K/uL   nRBC 0.0 0.0 - 0.2 %  Basic metabolic panel     Status: Abnormal   Collection Time: 06/08/18  3:29 AM  Result Value Ref Range   Sodium 136 135 - 145 mmol/L   Potassium 3.4 (L) 3.5 - 5.1 mmol/L   Chloride 111 98 - 111 mmol/L   CO2 22 22 - 32 mmol/L   Glucose, Bld 108 (H) 70 - 99 mg/dL   BUN 11 6 - 20 mg/dL   Creatinine, Ser 0.86 0.44 - 1.00 mg/dL   Calcium 8.2 (L) 8.9  - 10.3 mg/dL   GFR calc non Af Amer >60 >60 mL/min   GFR calc Af Amer >60 >60 mL/min   Anion gap 3 (L) 5 - 15  Hemoglobin A1c     Status: None   Collection Time: 06/08/18  3:29 AM  Result Value Ref Range   Hgb A1c MFr Bld 5.5 4.8 - 5.6 %   Mean Plasma Glucose 111.15 mg/dL  Troponin I - Now Then Q6H     Status: Abnormal   Collection Time: 06/08/18  6:33 AM  Result Value Ref Range   Troponin I 0.27 (HH) <0.03 ng/mL   Dg Chest 2 View  Result Date: 06/07/2018 CLINICAL DATA:  Chest pain. EXAM: CHEST - 2 VIEW COMPARISON:  February 06, 2017 FINDINGS: The heart size and mediastinal contours are within normal limits. Both lungs are clear. The visualized skeletal structures are unremarkable. IMPRESSION: No active cardiopulmonary disease. Electronically Signed   By: Gerome Sam III M.D   On: 06/07/2018 17:21   Ct Angio Chest Pe W And/or Wo Contrast  Result Date: 06/07/2018 CLINICAL DATA:  Chest discomfort EXAM: CT ANGIOGRAPHY CHEST WITH CONTRAST TECHNIQUE: Multidetector CT imaging of the chest was performed using the standard protocol during bolus administration of intravenous contrast. Multiplanar CT image reconstructions and MIPs were obtained to evaluate the vascular anatomy. CONTRAST:  75mL OMNIPAQUE IOHEXOL 350 MG/ML SOLN COMPARISON:  Chest x-ray 06/07/2018 FINDINGS: Cardiovascular: Satisfactory opacification of the pulmonary arteries to the segmental level. No evidence of pulmonary embolism. Normal heart size. No pericardial effusion. Mediastinum/Nodes: No enlarged mediastinal, hilar, or axillary lymph nodes. Thyroid gland, trachea, and esophagus demonstrate no significant findings. Lungs/Pleura: Lungs are clear. No pleural effusion or pneumothorax. Upper Abdomen: No acute abnormality. Musculoskeletal: No chest wall abnormality. No acute or significant osseous findings. Review of the MIP images confirms the above findings. IMPRESSION: Negative. No CT evidence for acute pulmonary embolus. Clear  lung fields. Electronically Signed   By: Jasmine Pang M.D.   On: 06/07/2018 23:07     ASSESSMENT AND PLAN: Borderline elevation of troponin with EKG changes and chest pain.  Because of dynamic EKG changes and elevated troponin all 3 sets we will have to set up the patient for cardiac catheterization.  Explained procedure to the patient via radial and femoral technique and patient has agreed to the procedure and will set up Monday morning.  Marialice Newkirk A

## 2018-06-09 LAB — BASIC METABOLIC PANEL
Anion gap: 6 (ref 5–15)
BUN: 11 mg/dL (ref 6–20)
CO2: 22 mmol/L (ref 22–32)
Calcium: 8.7 mg/dL — ABNORMAL LOW (ref 8.9–10.3)
Chloride: 109 mmol/L (ref 98–111)
Creatinine, Ser: 0.72 mg/dL (ref 0.44–1.00)
GFR calc Af Amer: >60 mL/min (ref >60)
GFR calc non Af Amer: >60 mL/min (ref >60)
Glucose, Bld: 97 mg/dL (ref 70–99)
Potassium: 3.7 mmol/L (ref 3.5–5.1)
Sodium: 137 mmol/L (ref 135–145)

## 2018-06-09 LAB — HEPARIN LEVEL (UNFRACTIONATED)
Heparin Unfractionated: 0.11 IU/mL — ABNORMAL LOW (ref 0.30–0.70)
Heparin Unfractionated: 0.26 IU/mL — ABNORMAL LOW (ref 0.30–0.70)
Heparin Unfractionated: 0.83 IU/mL — ABNORMAL HIGH (ref 0.30–0.70)

## 2018-06-09 LAB — PLATELET COUNT: Platelets: 223 10*3/uL (ref 150–400)

## 2018-06-09 LAB — HEMOGLOBIN: Hemoglobin: 12.7 g/dL (ref 12.0–15.0)

## 2018-06-09 MED ORDER — SODIUM CHLORIDE 0.9% FLUSH: 3.0000 mL | INTRAVENOUS | Status: DC | PRN

## 2018-06-09 MED ORDER — ASPIRIN 81 MG PO CHEW
81.0000 mg | CHEWABLE_TABLET | ORAL | Status: AC
  Administered 2018-06-10: 81 mg via ORAL
  Filled 2018-06-09: qty 1

## 2018-06-09 MED ORDER — SODIUM CHLORIDE 0.9 % IV SOLN: 250.0000 mL | INTRAVENOUS | Status: DC | PRN

## 2018-06-09 MED ORDER — SODIUM CHLORIDE 0.9% FLUSH
3.0000 mL | Freq: Two times a day (BID) | INTRAVENOUS | Status: DC
  Administered 2018-06-09: 3 mL via INTRAVENOUS

## 2018-06-09 MED ORDER — HEPARIN BOLUS VIA INFUSION
2800.0000 [IU] | Freq: Once | INTRAVENOUS | Status: AC
  Administered 2018-06-09: 2800 [IU] via INTRAVENOUS
  Filled 2018-06-09: qty 2800

## 2018-06-09 MED ORDER — SODIUM CHLORIDE 0.9 % WEIGHT BASED INFUSION
1.0000 mL/kg/h | INTRAVENOUS | Status: DC
  Administered 2018-06-10: 1 mL/kg/h via INTRAVENOUS

## 2018-06-09 MED ORDER — HEPARIN BOLUS VIA INFUSION
1400.0000 [IU] | Freq: Once | INTRAVENOUS | Status: AC
  Administered 2018-06-09: 1400 [IU] via INTRAVENOUS
  Filled 2018-06-09: qty 1400

## 2018-06-09 MED ORDER — SODIUM CHLORIDE 0.9 % WEIGHT BASED INFUSION
3.0000 mL/kg/h | INTRAVENOUS | Status: AC
  Administered 2018-06-10: 3 mL/kg/h via INTRAVENOUS

## 2018-06-09 NOTE — Plan of Care (Signed)

## 2018-06-09 NOTE — Progress Notes (Signed)
[employer] [facility] [place] [facility]   PATIENT NAME: Sonya [name]    MR#:  [id number]  DATE OF BIRTH:  1980-08-15  SUBJECTIVE:   No chest pain overnight Doing well  REVIEW OF SYSTEMS:    Review of Systems  Constitutional: Negative for fever, chills weight loss HENT: Negative for ear pain, nosebleeds, congestion, facial swelling, rhinorrhea, neck pain, neck stiffness and ear discharge.   Respiratory: Negative for cough, shortness of breath, wheezing  Cardiovascular: Negative for chest pain, palpitations and leg swelling.  Gastrointestinal: Negative for heartburn, abdominal pain, vomiting, diarrhea or consitpation Genitourinary: Negative for dysuria, urgency, frequency, hematuria Musculoskeletal: Negative for back pain or joint pain Neurological: Negative for dizziness, seizures, syncope, focal weakness,  numbness and headaches.  Hematological: Does not bruise/bleed easily.  Psychiatric/Behavioral: Negative for hallucinations, confusion, dysphoric mood    Tolerating Diet: yes      DRUG ALLERGIES:   Allergies  Allergen Reactions  . Penicillins Hives    Has patient had a PCN reaction causing immediate rash, facial/tongue/throat swelling, SOB or lightheadedness with hypotension: Unknown Has patient had a PCN reaction causing severe rash involving mucus membranes or skin necrosis: Unknown Has patient had a PCN reaction that required hospitalization: Unknown Has patient had a PCN reaction occurring within the last 10 years: No If all of the above answers are "NO", then may proceed with Cephalosporin use.  Childhood reported allergy    VITALS:  Blood pressure 117/81, pulse 67, temperature 98.4 F (36.9 C), temperature source Oral, resp. rate 20, height 5\' 11"  (1.803 m), weight 100.7 kg, SpO2 98 %.  PHYSICAL EXAMINATION:  Constitutional: Appears well-developed and well-nourished. No distress. HENT: Normocephalic. Marland Kitchen Oropharynx is clear and moist.  Eyes:  Conjunctivae and EOM are normal. PERRLA, no scleral icterus.  Neck: Normal ROM. Neck supple. No JVD. No tracheal deviation. CVS: RRR, S1/S2 +, no murmurs, no gallops, no carotid bruit.  Pulmonary: Effort and breath sounds normal, no stridor, rhonchi, wheezes, rales.  Abdominal: Soft. BS +,  no distension, tenderness, rebound or guarding.  Musculoskeletal: Normal range of motion. No edema and no tenderness.  Neuro: Alert. CN 2-12 grossly intact. No focal deficits. Skin: Skin is warm and dry. No rash noted. Psychiatric: Normal mood and affect.      LABORATORY PANEL:   CBC Recent Labs  Lab 06/08/18 0329 06/09/18 0541  WBC 11.1*  --   HGB 12.5 12.7  HCT 37.9  --   PLT 216 223   ------------------------------------------------------------------------------------------------------------------  Chemistries  Recent Labs  Lab 06/09/18 0541  NA 137  K 3.7  CL 109  CO2 22  GLUCOSE 97  BUN 11  CREATININE 0.72  CALCIUM 8.7*   ------------------------------------------------------------------------------------------------------------------  Cardiac Enzymes Recent Labs  Lab 06/08/18 0045 06/08/18 0633 06/08/18 1325  TROPONINI 0.26* 0.27* 0.24*   ------------------------------------------------------------------------------------------------------------------  RADIOLOGY:  Dg Chest 2 View  Result Date: 06/07/2018 CLINICAL DATA:  Chest pain. EXAM: CHEST - 2 VIEW COMPARISON:  February 06, 2017 FINDINGS: The heart size and mediastinal contours are within normal limits. Both lungs are clear. The visualized skeletal structures are unremarkable. IMPRESSION: No active cardiopulmonary disease. Electronically Signed   By: Gerome Sam III M.D   On: 06/07/2018 17:21   Ct Angio Chest Pe W And/or Wo Contrast  Result Date: 06/07/2018 CLINICAL DATA:  Chest discomfort EXAM: CT ANGIOGRAPHY CHEST WITH CONTRAST TECHNIQUE: Multidetector CT imaging of the chest was performed using the  standard protocol during bolus administration of intravenous contrast. Multiplanar CT image  reconstructions and MIPs were obtained to evaluate the vascular anatomy. CONTRAST:  75mL OMNIPAQUE IOHEXOL 350 MG/ML SOLN COMPARISON:  Chest x-ray 06/07/2018 FINDINGS: Cardiovascular: Satisfactory opacification of the pulmonary arteries to the segmental level. No evidence of pulmonary embolism. Normal heart size. No pericardial effusion. Mediastinum/Nodes: No enlarged mediastinal, hilar, or axillary lymph nodes. Thyroid gland, trachea, and esophagus demonstrate no significant findings. Lungs/Pleura: Lungs are clear. No pleural effusion or pneumothorax. Upper Abdomen: No acute abnormality. Musculoskeletal: No chest wall abnormality. No acute or significant osseous findings. Review of the MIP images confirms the above findings. IMPRESSION: Negative. No CT evidence for acute pulmonary embolus. Clear lung fields. Electronically Signed   By: Jasmine PangKim  Fujinaga M.D.   On: 06/07/2018 23:07     ASSESSMENT AND PLAN:   37 year old female with a history of tobacco dependence who presented with chest pain.  1.  Non-STEMI: Troponin max 0.28 patient for cardiac catheterization on Monday  Continue heparin drip and aspirin Add beta-blocker if blood pressure and heart rate tolerate.  2.  Hypokalemia: Replete prn  3.Tobacco dependence: Patient is encouraged to quit smoking. Counseling was provided for 4 minutes. 4.  GERD: Continue PPI  Management plans discussed with the patient and she is in agreement.  CODE STATUS: Full  TOTAL TIME TAKING CARE OF THIS PATIENT: 24 minutes.     POSSIBLE D/C Monday or Tuesday, DEPENDING ON CLINICAL CONDITION.   Bradlee Heitman M.D on 06/09/2018 at 11:24 AM  Between 7am to 6pm - Pager - [phone] After 6pm go to [email or web address] - Social research officer, governmentpassword [email or web address] [facility]  [facility] [place] [facility]  Office  [phone]  CC: Primary care physician; Center, [facility]  Note: This  dictation was prepared with Nurse, children'sDragon dictation along with smaller phrase technology. Any transcriptional errors that result from this process are unintentional.

## 2018-06-09 NOTE — Plan of Care (Signed)
  Problem: Health Behavior/Discharge Planning: Goal: Ability to manage health-related needs will improve Outcome: Progressing Note:  Patient was initially stating that she was strongly considering leaving [facility] today, for reasons unknown. However, patient has reconsidered and now states she is planning to stay in hospital for tomorrow's heart catheterization. Orders for tomorrow's procedure released by me this AM, consent most likely to be signed this afternoon. Will continue to monitor. Jari FavreSteven M [facility] Hospital[facility]hoff

## 2018-06-09 NOTE — Progress Notes (Signed)
ANTICOAGULATION CONSULT NOTE - Initial Consult  Pharmacy Consult for Heparin Indication: chest pain/ACS  Allergies  Allergen Reactions  . Penicillins Hives    Has patient had a PCN reaction causing immediate rash, facial/tongue/throat swelling, SOB or lightheadedness with hypotension: Unknown Has patient had a PCN reaction causing severe rash involving mucus membranes or skin necrosis: Unknown Has patient had a PCN reaction that required hospitalization: Unknown Has patient had a PCN reaction occurring within the last 10 years: No If all of the above answers are "NO", then may proceed with Cephalosporin use.  Childhood reported allergy    Patient Measurements: Height: 5\' 11"  (180.3 cm) Weight: 221 lb 14.4 oz (100.7 kg) IBW/kg (Calculated) : 70.8 Heparin Dosing Weight: 91.9 kg  Vital Signs: Temp: 98.4 F (36.9 C) (12/15 0458) Temp Source: Oral (12/15 0458) BP: 124/90 (12/15 0458) Pulse Rate: 66 (12/15 0458)  Labs: Recent Labs    06/07/18 1706 06/07/18 2020 06/08/18 0045 06/08/18 0329 06/08/18 0633 06/08/18 1016 06/08/18 1325 06/09/18 0541  HGB 13.3  --   --  12.5  --   --   --  12.7  HCT 40.0  --   --  37.9  --   --   --   --   PLT 233  --   --  216  --   --   --  223  APTT  --  28  --   --   --   --   --   --   LABPROT  --  13.1  --   --   --   --   --   --   INR  --  1.00  --   --   --   --   --   --   HEPARINUNFRC  --   --   --  0.33  --  0.31  --  0.11*  CREATININE 0.68  --   --  0.65  --   --   --  0.72  TROPONINI 0.28* 0.28* 0.26*  --  0.27*  --  0.24*  --     Estimated Creatinine Clearance: 124.6 mL/min (by C-G formula based on SCr of 0.72 mg/dL).   Medical History: Past Medical History:  Diagnosis Date  . GERD (gastroesophageal reflux disease)   . Tobacco abuse     Assessment: Patient is a 37yo female admitted for chest pain. Pharmacy consulted for Heparin dosing.  Goal of Therapy:  Heparin level 0.3-0.7 units/ml Monitor platelets by  anticoagulation protocol: Yes   Plan:  Give 4000 units bolus x 1 Start heparin infusion at 1200 units/hr Check anti-Xa level in 6 hours and daily while on heparin Continue to monitor H&H and platelets   12/14 0330 heparin level 0.33. Continue current regimen. Recheck in 6 hours to confirm.  12/15 AM heparin level 0.11. 2800 unit bolus and increase rate to 1500 units/hr. Recheck in 6 hours  .Fulton ReekMatt Montana Bryngelson, PharmD, BCPS  06/09/18 6:22 AM

## 2018-06-09 NOTE — Plan of Care (Signed)
  Problem: Education: Goal: Knowledge of General Education information will improve Description: Including pain rating scale, medication(s)/side effects and non-pharmacologic comfort measures Outcome: Progressing   Problem: Health Behavior/Discharge Planning: Goal: Ability to manage health-related needs will improve Outcome: Progressing   Problem: Clinical Measurements: Goal: Ability to maintain clinical measurements within normal limits will improve Outcome: Progressing Goal: Diagnostic test results will improve Outcome: Progressing   Problem: Activity: Goal: Risk for activity intolerance will decrease Outcome: Progressing   Problem: Pain Managment: Goal: General experience of comfort will improve Outcome: Progressing   

## 2018-06-09 NOTE — Progress Notes (Signed)
ANTICOAGULATION CONSULT NOTE - Follow Up Consult  Pharmacy Consult for Heparin Indication: chest pain/ACS  Allergies  Allergen Reactions  . Penicillins Hives    Has patient had a PCN reaction causing immediate rash, facial/tongue/throat swelling, SOB or lightheadedness with hypotension: Unknown Has patient had a PCN reaction causing severe rash involving mucus membranes or skin necrosis: Unknown Has patient had a PCN reaction that required hospitalization: Unknown Has patient had a PCN reaction occurring within the last 10 years: No If all of the above answers are "NO", then may proceed with Cephalosporin use.  Childhood reported allergy    Patient Measurements: Height: 5\' 11"  (180.3 cm) Weight: 221 lb 14.4 oz (100.7 kg) IBW/kg (Calculated) : 70.8 Heparin Dosing Weight: 91.9 kg  Vital Signs: Temp: 98.3 F (36.8 C) (12/15 1939) Temp Source: Oral (12/15 1939) BP: 155/99 (12/15 1939) Pulse Rate: 82 (12/15 1939)  Labs: Recent Labs    06/07/18 1706 06/07/18 2020 06/08/18 0045 06/08/18 0329 06/08/18 [id number]  06/08/18 1325 06/09/18 0541 06/09/18 1425 06/09/18 2044  HGB 13.3  --   --  12.5  --   --   --  12.7  --   --   HCT 40.0  --   --  37.9  --   --   --   --   --   --   PLT 233  --   --  216  --   --   --  223  --   --   APTT  --  28  --   --   --   --   --   --   --   --   LABPROT  --  13.1  --   --   --   --   --   --   --   --   INR  --  1.00  --   --   --   --   --   --   --   --   HEPARINUNFRC  --   --   --  0.33  --    < >  --  0.11* 0.26* 0.83*  CREATININE 0.68  --   --  0.65  --   --   --  0.72  --   --   TROPONINI 0.28* 0.28* 0.26*  --  0.27*  --  0.24*  --   --   --    < > = values in this interval not displayed.    Estimated Creatinine Clearance: 124.6 mL/min (by C-G formula based on SCr of 0.72 mg/dL).   Medical History: Past Medical History:  Diagnosis Date  . GERD (gastroesophageal reflux disease)   . Tobacco abuse     Assessment: Patient is a  37yo female admitted for chest pain. Pharmacy consulted for Heparin dosing.  12/14 heparin started @ 1200 units/hr  12/15 AM heparin level 0.11. 2800 unit bolus and increase rate to 1500 units/hr. Recheck in 6 hours   12/15 14:25  HL = 0.26.  Bolus 1400 units. Increase drip rate to 1650 units/hr. Recheck HL tonight at 21:00.  Goal of Therapy:  Heparin level 0.3-0.7 units/ml Monitor platelets by anticoagulation protocol: Yes   Plan:  12/15 HL: 0.83. Level is supratherapeutic. Will decrease heparin infusion to 1550 units/hr. Recheck heparin level in 6 hours.  CBC with am labs per protocol.   Gardner CandleSheema M Luigi Stuckey, PharmD, BCPS Clinical Pharmacist 06/09/2018 9:45 PM

## 2018-06-09 NOTE — Progress Notes (Signed)
ANTICOAGULATION CONSULT NOTE - Follow Up Consult  Pharmacy Consult for Heparin Indication: chest pain/ACS  Allergies  Allergen Reactions  . Penicillins Hives    Has patient had a PCN reaction causing immediate rash, facial/tongue/throat swelling, SOB or lightheadedness with hypotension: Unknown Has patient had a PCN reaction causing severe rash involving mucus membranes or skin necrosis: Unknown Has patient had a PCN reaction that required hospitalization: Unknown Has patient had a PCN reaction occurring within the last 10 years: No If all of the above answers are "NO", then may proceed with Cephalosporin use.  Childhood reported allergy    Patient Measurements: Height: 5\' 11"  (180.3 cm) Weight: 221 lb 14.4 oz (100.7 kg) IBW/kg (Calculated) : 70.8 Heparin Dosing Weight: 91.9 kg  Vital Signs: Temp: 98.4 F (36.9 C) (12/15 0810) Temp Source: Oral (12/15 0810) BP: 117/81 (12/15 0810) Pulse Rate: 67 (12/15 0810)  Labs: Recent Labs    06/07/18 1706 06/07/18 2020 06/08/18 0045  06/08/18 0329 06/08/18 0633 06/08/18 1016 06/08/18 1325 06/09/18 0541 06/09/18 1425  HGB 13.3  --   --   --  12.5  --   --   --  12.7  --   HCT 40.0  --   --   --  37.9  --   --   --   --   --   PLT 233  --   --   --  216  --   --   --  223  --   APTT  --  28  --   --   --   --   --   --   --   --   LABPROT  --  13.1  --   --   --   --   --   --   --   --   INR  --  1.00  --   --   --   --   --   --   --   --   HEPARINUNFRC  --   --   --    < > 0.33  --  0.31  --  0.11* 0.26*  CREATININE 0.68  --   --   --  0.65  --   --   --  0.72  --   TROPONINI 0.28* 0.28* 0.26*  --   --  0.27*  --  0.24*  --   --    < > = values in this interval not displayed.    Estimated Creatinine Clearance: 124.6 mL/min (by C-G formula based on SCr of 0.72 mg/dL).   Medical History: Past Medical History:  Diagnosis Date  . GERD (gastroesophageal reflux disease)   . Tobacco abuse     Assessment: Patient is a  37yo female admitted for chest pain. Pharmacy consulted for Heparin dosing.  Goal of Therapy:  Heparin level 0.3-0.7 units/ml Monitor platelets by anticoagulation protocol: Yes   Plan:  Give 4000 units bolus x 1 Start heparin infusion at 1200 units/hr Check anti-Xa level in 6 hours and daily while on heparin Continue to monitor H&H and platelets   12/15 AM heparin level 0.11. 2800 unit bolus and increase rate to 1500 units/hr. Recheck in 6 hours   12/15 14:25  HL = 0.26.  Bolus 1400 units. Increase drip rate to 1650 units/hr. Recheck HL tonight at 21:00.  Stormy CardKatsoudas,Rosmarie Esquibel K, [facility] HospitalRPH 06/09/18 3:00 PM

## 2018-06-10 ENCOUNTER — Encounter
Admission: EM | Disposition: A | Discharge status: Home / Self Care | Payer: Self-pay | Source: Home / Self Care | Attending: Internal Medicine

## 2018-06-10 ENCOUNTER — Encounter: Payer: Self-pay | Admitting: *Deleted

## 2018-06-10 HISTORY — PX: CORONARY STENT INTERVENTION: CATH118234

## 2018-06-10 HISTORY — PX: LEFT HEART CATH AND CORONARY ANGIOGRAPHY: CATH118249

## 2018-06-10 LAB — ECHOCARDIOGRAM COMPLETE
Height: 71 in
Weight: 3608 oz

## 2018-06-10 LAB — BASIC METABOLIC PANEL
Anion gap: 4 — ABNORMAL LOW (ref 5–15)
BUN: 12 mg/dL (ref 6–20)
CO2: 23 mmol/L (ref 22–32)
Calcium: 8.4 mg/dL — ABNORMAL LOW (ref 8.9–10.3)
Chloride: 109 mmol/L (ref 98–111)
Creatinine, Ser: 0.74 mg/dL (ref 0.44–1.00)
GFR calc Af Amer: >60 mL/min (ref >60)
GFR calc non Af Amer: >60 mL/min (ref >60)
Glucose, Bld: 99 mg/dL (ref 70–99)
Potassium: 3.7 mmol/L (ref 3.5–5.1)
Sodium: 136 mmol/L (ref 135–145)

## 2018-06-10 LAB — CBC
HCT: 38 % (ref 36.0–46.0)
Hemoglobin: 12.8 g/dL (ref 12.0–15.0)
MCH: 29.2 pg (ref 26.0–34.0)
MCHC: 33.7 g/dL (ref 30.0–36.0)
MCV: 86.8 fL (ref 80.0–100.0)
Platelets: 224 10*3/uL (ref 150–400)
RBC: 4.38 MIL/uL (ref 3.87–5.11)
RDW: 13.9 % (ref 11.5–15.5)
WBC: 9.6 10*3/uL (ref 4.0–10.5)
nRBC: 0 % (ref 0.0–0.2)

## 2018-06-10 LAB — POCT ACTIVATED CLOTTING TIME: Activated Clotting Time: 241 seconds

## 2018-06-10 LAB — HEPARIN LEVEL (UNFRACTIONATED): Heparin Unfractionated: 0.73 IU/mL — ABNORMAL HIGH (ref 0.30–0.70)

## 2018-06-10 SURGERY — LEFT HEART CATH AND CORONARY ANGIOGRAPHY
Anesthesia: Moderate Sedation

## 2018-06-10 MED ORDER — HEPARIN SODIUM (PORCINE) 1000 UNIT/ML IJ SOLN
INTRAMUSCULAR | Status: AC
  Filled 2018-06-10: qty 1

## 2018-06-10 MED ORDER — ASPIRIN 81 MG PO CHEW
CHEWABLE_TABLET | ORAL | Status: DC | PRN
  Administered 2018-06-10: 243 mg via ORAL

## 2018-06-10 MED ORDER — HYDRALAZINE HCL 20 MG/ML IJ SOLN: 5.0000 mg | INTRAMUSCULAR | Status: DC | PRN

## 2018-06-10 MED ORDER — IOPAMIDOL (ISOVUE-300) INJECTION 61%
INTRAVENOUS | Status: DC | PRN
  Administered 2018-06-10: 210 mL via INTRA_ARTERIAL
  Administered 2018-06-10: 150 mL via INTRA_ARTERIAL
  Administered 2018-06-10: 60 mL via INTRA_ARTERIAL

## 2018-06-10 MED ORDER — NITROGLYCERIN 5 MG/ML IV SOLN
INTRAVENOUS | Status: AC
  Filled 2018-06-10: qty 10

## 2018-06-10 MED ORDER — ACETAMINOPHEN 325 MG PO TABS: 650.0000 mg | ORAL_TABLET | ORAL | Status: DC | PRN

## 2018-06-10 MED ORDER — MIDAZOLAM HCL 2 MG/2ML IJ SOLN
INTRAMUSCULAR | Status: AC
  Filled 2018-06-10: qty 2

## 2018-06-10 MED ORDER — ONDANSETRON HCL 4 MG/2ML IJ SOLN: 4.0000 mg | Freq: Four times a day (QID) | INTRAMUSCULAR | Status: DC | PRN

## 2018-06-10 MED ORDER — HEPARIN SODIUM (PORCINE) 1000 UNIT/ML IJ SOLN
INTRAMUSCULAR | Status: DC | PRN
  Administered 2018-06-10 (×3): 5000 [IU] via INTRAVENOUS

## 2018-06-10 MED ORDER — ASPIRIN 81 MG PO CHEW
81.0000 mg | CHEWABLE_TABLET | Freq: Every day | ORAL | Status: DC
  Administered 2018-06-11: 81 mg via ORAL
  Filled 2018-06-10: qty 1

## 2018-06-10 MED ORDER — ACETAMINOPHEN 325 MG PO TABS
ORAL_TABLET | ORAL | Status: AC
  Administered 2018-06-10: 650 mg
  Filled 2018-06-10: qty 2

## 2018-06-10 MED ORDER — TICAGRELOR 90 MG PO TABS
ORAL_TABLET | ORAL | Status: AC
  Filled 2018-06-10: qty 2

## 2018-06-10 MED ORDER — HEPARIN (PORCINE) 25000 UT/250ML-% IV SOLN: 1450.0000 [IU]/h | INTRAVENOUS | Status: DC

## 2018-06-10 MED ORDER — ASPIRIN 81 MG PO CHEW
CHEWABLE_TABLET | ORAL | Status: AC
  Filled 2018-06-10: qty 3

## 2018-06-10 MED ORDER — VERAPAMIL HCL 2.5 MG/ML IV SOLN
INTRAVENOUS | Status: AC
  Filled 2018-06-10: qty 2

## 2018-06-10 MED ORDER — HEPARIN (PORCINE) IN NACL 1000-0.9 UT/500ML-% IV SOLN
INTRAVENOUS | Status: AC
  Filled 2018-06-10: qty 1000

## 2018-06-10 MED ORDER — MIDAZOLAM HCL 2 MG/2ML IJ SOLN
INTRAMUSCULAR | Status: DC | PRN
  Administered 2018-06-10: 1 mg via INTRAVENOUS

## 2018-06-10 MED ORDER — TICAGRELOR 90 MG PO TABS
ORAL_TABLET | ORAL | Status: DC | PRN
  Administered 2018-06-10: 180 mg via ORAL

## 2018-06-10 MED ORDER — NITROGLYCERIN 1 MG/10 ML FOR IR/CATH LAB
INTRA_ARTERIAL | Status: DC | PRN
  Administered 2018-06-10: 200 ug via INTRACORONARY

## 2018-06-10 MED ORDER — TICAGRELOR 90 MG PO TABS
90.0000 mg | ORAL_TABLET | Freq: Two times a day (BID) | ORAL | Status: DC
  Administered 2018-06-10: 90 mg via ORAL
  Filled 2018-06-10 (×2): qty 1

## 2018-06-10 MED ORDER — FENTANYL CITRATE (PF) 100 MCG/2ML IJ SOLN
INTRAMUSCULAR | Status: DC | PRN
  Administered 2018-06-10: 50 ug via INTRAVENOUS

## 2018-06-10 MED ORDER — PNEUMOCOCCAL VAC POLYVALENT 25 MCG/0.5ML IJ INJ
0.5000 mL | INJECTION | INTRAMUSCULAR | Status: AC
  Administered 2018-06-11: 0.5 mL via INTRAMUSCULAR
  Filled 2018-06-10: qty 0.5

## 2018-06-10 MED ORDER — SODIUM CHLORIDE 0.9% FLUSH
3.0000 mL | Freq: Two times a day (BID) | INTRAVENOUS | Status: DC
  Administered 2018-06-11: 3 mL via INTRAVENOUS

## 2018-06-10 MED ORDER — INFLUENZA VAC SPLIT QUAD 0.5 ML IM SUSY
0.5000 mL | PREFILLED_SYRINGE | INTRAMUSCULAR | Status: AC
  Administered 2018-06-11: 0.5 mL via INTRAMUSCULAR
  Filled 2018-06-10: qty 0.5

## 2018-06-10 MED ORDER — SODIUM CHLORIDE 0.9 % IV SOLN: 250.0000 mL | INTRAVENOUS | Status: DC | PRN

## 2018-06-10 MED ORDER — SODIUM CHLORIDE 0.9% FLUSH: 3.0000 mL | INTRAVENOUS | Status: DC | PRN

## 2018-06-10 MED ORDER — SODIUM CHLORIDE 0.9 % WEIGHT BASED INFUSION
1.0000 mL/kg/h | INTRAVENOUS | Status: DC
  Administered 2018-06-10 (×2): 1 mL/kg/h via INTRAVENOUS

## 2018-06-10 MED ORDER — FENTANYL CITRATE (PF) 100 MCG/2ML IJ SOLN
INTRAMUSCULAR | Status: AC
  Filled 2018-06-10: qty 2

## 2018-06-10 MED ORDER — LABETALOL HCL 5 MG/ML IV SOLN: 10.0000 mg | INTRAVENOUS | Status: DC | PRN

## 2018-06-10 MED ORDER — NICOTINE 14 MG/24HR TD PT24: 14.0000 mg | MEDICATED_PATCH | Freq: Every day | TRANSDERMAL | 0 refills | Status: DC

## 2018-06-10 MED ORDER — ASPIRIN 81 MG PO CHEW: 81.0000 mg | CHEWABLE_TABLET | Freq: Every day | ORAL | 0 refills | Status: DC

## 2018-06-10 SURGICAL SUPPLY — 14 items
BALLN TREK RX 2.75X15 (BALLOONS) ×2
BALLOON TREK RX 2.75X15 (BALLOONS) ×1 IMPLANT
CATH INFINITI 5 FR JL3.5 (CATHETERS) ×2 IMPLANT
CATH INFINITI JR4 5F (CATHETERS) ×2 IMPLANT
CATH VISTA GUIDE 6FR XB3.5 SH (CATHETERS) ×2 IMPLANT
DEVICE INFLAT 30 PLUS (MISCELLANEOUS) ×2 IMPLANT
DEVICE RAD COMP TR BAND LRG (VASCULAR PRODUCTS) ×2 IMPLANT
GLIDESHEATH SLEND A-KIT 6F 22G (SHEATH) ×2 IMPLANT
GLIDESHEATH SLEND SS 6F .021 (SHEATH) ×2 IMPLANT
KIT MANI 3VAL PERCEP (MISCELLANEOUS) ×2 IMPLANT
PACK CARDIAC CATH (CUSTOM PROCEDURE TRAY) ×2 IMPLANT
STENT SIERRA 4.00 X 15 MM (Permanent Stent) ×2 IMPLANT
WIRE G HI TQ BMW 190 (WIRE) ×2 IMPLANT
WIRE ROSEN-J .035X260CM (WIRE) ×2 IMPLANT

## 2018-06-10 NOTE — Progress Notes (Signed)
SUBJECTIVE: Pt is feeling well no chest pain or shortness of breath.   Vitals:   06/09/18 1542 06/09/18 1939 06/10/18 0433 06/10/18 0745  BP: 131/87 (!) 155/99 131/83 120/73  Pulse: 77 82 63 62  Resp: 18 16 16 18   Temp: 98.4 F (36.9 C) 98.3 F (36.8 C)    TempSrc: Oral Oral    SpO2: 100% 98% 100% 94%  Weight:      Height:        Intake/Output Summary (Last 24 hours) at 06/10/2018 0833 Last data filed at 06/10/2018 0600 Gross per 24 hour  Intake 150 ml  Output 400 ml  Net -250 ml    LABS: Basic Metabolic Panel: Recent Labs    06/09/18 0541 06/10/18 0626  NA 137 136  K 3.7 3.7  CL 109 109  CO2 22 23  GLUCOSE 97 99  BUN 11 12  CREATININE 0.72 0.74  CALCIUM 8.7* 8.4*   Liver Function Tests: No results for input(s): AST, ALT, ALKPHOS, BILITOT, PROT, ALBUMIN in the last 72 hours. No results for input(s): LIPASE, AMYLASE in the last 72 hours. CBC: Recent Labs    06/08/18 0329 06/09/18 0541 06/10/18 0626  WBC 11.1*  --  9.6  HGB 12.5 12.7 12.8  HCT 37.9  --  38.0  MCV 88.3  --  86.8  PLT 216 223 224   Cardiac Enzymes: Recent Labs    06/08/18 0045 06/08/18 0633 06/08/18 1325  TROPONINI 0.26* 0.27* 0.24*   BNP: Invalid input(s): POCBNP D-Dimer: No results for input(s): DDIMER in the last 72 hours. Hemoglobin A1C: Recent Labs    06/08/18 0329  HGBA1C 5.5   Fasting Lipid Panel: No results for input(s): CHOL, HDL, LDLCALC, TRIG, CHOLHDL, LDLDIRECT in the last 72 hours. Thyroid Function Tests: No results for input(s): TSH, T4TOTAL, T3FREE, THYROIDAB in the last 72 hours.  Invalid input(s): FREET3 Anemia Panel: No results for input(s): VITAMINB12, FOLATE, FERRITIN, TIBC, IRON, RETICCTPCT in the last 72 hours.   PHYSICAL EXAM General: Well developed, well nourished, in no acute distress HEENT:  Normocephalic and atramatic Msk:  Back normal, normal gait. Normal strength and tone for age. Extremities: No clubbing, cyanosis or edema.   Neuro:  Alert and oriented X 3. Psych:  Good affect, responds appropriately  TELEMETRY: NSR 74bpm  ASSESSMENT AND PLAN: NSTEMI with elevated troponin to max of 0.28. Scheduled for cardiac catheterization today at 11:30, ptient to remain NPO until that time. Echo pending, blood pressure stable, on heparin drip.  Principal Problem:   NSTEMI (non-ST elevated myocardial infarction) (HCC) Active Problems:   GERD (gastroesophageal reflux disease)   Tobacco abuse    Sonya HammanKristin Starkisha Tullis, NP-C 06/10/2018 8:33 AM Cell: [phone]

## 2018-06-10 NOTE — Progress Notes (Signed)
ANTICOAGULATION CONSULT NOTE - Follow Up Consult  Pharmacy Consult for Heparin Indication: chest pain/ACS  Allergies  Allergen Reactions  . Penicillins Hives    Has patient had a PCN reaction causing immediate rash, facial/tongue/throat swelling, SOB or lightheadedness with hypotension: Unknown Has patient had a PCN reaction causing severe rash involving mucus membranes or skin necrosis: Unknown Has patient had a PCN reaction that required hospitalization: Unknown Has patient had a PCN reaction occurring within the last 10 years: No If all of the above answers are "NO", then may proceed with Cephalosporin use.  Childhood reported allergy    Patient Measurements: Height: 5\' 11"  (180.3 cm) Weight: 221 lb 14.4 oz (100.7 kg) IBW/kg (Calculated) : 70.8 Heparin Dosing Weight: 91.9 kg  Vital Signs: Temp: 98.3 F (36.8 C) (12/15 1939) Temp Source: Oral (12/15 1939) BP: 131/83 (12/16 0433) Pulse Rate: 63 (12/16 0433)  Labs: Recent Labs    06/07/18 1706 06/07/18 2020 06/08/18 0045 06/08/18 0329 06/08/18 9604  06/08/18 1325 06/09/18 0541 06/09/18 1425 06/09/18 2044 06/10/18 0626  HGB 13.3  --   --  12.5  --   --   --  12.7  --   --  12.8  HCT 40.0  --   --  37.9  --   --   --   --   --   --  38.0  PLT 233  --   --  216  --   --   --  223  --   --  224  APTT  --  28  --   --   --   --   --   --   --   --   --   LABPROT  --  13.1  --   --   --   --   --   --   --   --   --   INR  --  1.00  --   --   --   --   --   --   --   --   --   HEPARINUNFRC  --   --   --  0.33  --    < >  --  0.11* 0.26* 0.83* 0.73*  CREATININE 0.68  --   --  0.65  --   --   --  0.72  --   --  0.74  TROPONINI 0.28* 0.28* 0.26*  --  0.27*  --  0.24*  --   --   --   --    < > = values in this interval not displayed.    Estimated Creatinine Clearance: 124.6 mL/min (by C-G formula based on SCr of 0.74 mg/dL).   Medical History: Past Medical History:  Diagnosis Date  . GERD (gastroesophageal reflux  disease)   . Tobacco abuse     Assessment: Patient is a 37yo female admitted for chest pain. Pharmacy consulted for Heparin dosing.  12/14 heparin started @ 1200 units/hr  12/15 AM heparin level 0.11. 2800 unit bolus and increase rate to 1500 units/hr. Recheck in 6 hours   12/15 14:25  HL = 0.26.  Bolus 1400 units. Increase drip rate to 1650 units/hr. Recheck HL tonight at 21:00.  Goal of Therapy:  Heparin level 0.3-0.7 units/ml Monitor platelets by anticoagulation protocol: Yes   Plan:  12/15 HL: 0.83. Level is supratherapeutic. Will decrease heparin infusion to 1550 units/hr. Recheck heparin level in 6 hours.  CBC with am labs  per protocol.   12/16 AM heparin level 0.73, Decrease rate to 1450 units/hr and recheck in 6 hours.  Erich MontaneMcBane,Circe Chilton S, PharmD, BCPS Clinical Pharmacist 06/10/2018 6:59 AM

## 2018-06-10 NOTE — Progress Notes (Signed)
[employer] [facility] [place] [facility]   PATIENT NAME: Sonya [name]    MR#:  [id number]  DATE OF BIRTH:  08-08-1980  SUBJECTIVE:   Without chest pain since hospitalization.  Wants to go home after cardiac catheterization.  REVIEW OF SYSTEMS:    Review of Systems  Constitutional: Negative for fever, chills weight loss HENT: Negative for ear pain, nosebleeds, congestion, facial swelling, rhinorrhea, neck pain, neck stiffness and ear discharge.   Respiratory: Negative for cough, shortness of breath, wheezing  Cardiovascular: Negative for chest pain, palpitations and leg swelling.  Gastrointestinal: Negative for heartburn, abdominal pain, vomiting, diarrhea or consitpation Genitourinary: Negative for dysuria, urgency, frequency, hematuria Musculoskeletal: Negative for back pain or joint pain Neurological: Negative for dizziness, seizures, syncope, focal weakness,  numbness and headaches.  Hematological: Does not bruise/bleed easily.  Psychiatric/Behavioral: Negative for hallucinations, confusion, dysphoric mood    Tolerating Diet: nP.o.      DRUG ALLERGIES:   Allergies  Allergen Reactions  . Penicillins Hives    Has patient had a PCN reaction causing immediate rash, facial/tongue/throat swelling, SOB or lightheadedness with hypotension: Unknown Has patient had a PCN reaction causing severe rash involving mucus membranes or skin necrosis: Unknown Has patient had a PCN reaction that required hospitalization: Unknown Has patient had a PCN reaction occurring within the last 10 years: No If all of the above answers are "NO", then may proceed with Cephalosporin use.  Childhood reported allergy    VITALS:  Blood pressure 120/73, pulse 62, temperature 98.3 F (36.8 C), temperature source Oral, resp. rate 18, height 5\' 11"  (1.803 m), weight 100.7 kg, SpO2 94 %.  PHYSICAL EXAMINATION:  Constitutional: Appears well-developed and well-nourished. No distress. HENT:  Normocephalic. Marland Kitchen Oropharynx is clear and moist.  Eyes: Conjunctivae and EOM are normal. PERRLA, no scleral icterus.  Neck: Normal ROM. Neck supple. No JVD. No tracheal deviation. CVS: RRR, S1/S2 +, no murmurs, no gallops, no carotid bruit.  Pulmonary: Effort and breath sounds normal, no stridor, rhonchi, wheezes, rales.  Abdominal: Soft. BS +,  no distension, tenderness, rebound or guarding.  Musculoskeletal: Normal range of motion. No edema and no tenderness.  Neuro: Alert. CN 2-12 grossly intact. No focal deficits. Skin: Skin is warm and dry. No rash noted. Psychiatric: Normal mood and affect.      LABORATORY PANEL:   CBC Recent Labs  Lab 06/10/18 0626  WBC 9.6  HGB 12.8  HCT 38.0  PLT 224   ------------------------------------------------------------------------------------------------------------------  Chemistries  Recent Labs  Lab 06/10/18 0626  NA 136  K 3.7  CL 109  CO2 23  GLUCOSE 99  BUN 12  CREATININE 0.74  CALCIUM 8.4*   ------------------------------------------------------------------------------------------------------------------  Cardiac Enzymes Recent Labs  Lab 06/08/18 0045 06/08/18 0633 06/08/18 1325  TROPONINI 0.26* 0.27* 0.24*   ------------------------------------------------------------------------------------------------------------------  RADIOLOGY:  No results found.   ASSESSMENT AND PLAN:   37 year old female with a history of tobacco dependence who presented with chest pain.  1.  Non-STEMI: Troponin max 0.27  Plan for cardiac catheterization today at 1130.    Continue heparin drip  Aspirin at discharge unless she has significant CAD and then Plavix will be added. Consider beta-blocker and statin as an outpatient.  Patient is hesitant to start on these medications at this time.   2.  Hypokalemia: Replete prn  3.Tobacco dependence: Patient is encouraged to quit smoking. Counseling was provided for 4  minutes.   Management plans discussed with the patient and she is in agreement.  CODE STATUS: Full  TOTAL TIME TAKING CARE OF THIS PATIENT: 24 minutes.   Discussed with cardiology  POSSIBLE D/C Monday or Tuesday, DEPENDING ON cardiac catheterization Merle Whitehorn M.D on 06/10/2018 at 10:29 AM  Between 7am to 6pm - Pager - [phone] After 6pm go to [email or web address] - Social research officer, governmentpassword [email or web address] [facility]  [facility]  Office  [phone]  CC: Primary care physician; Center, [facility]  Note: This dictation was prepared with Nurse, children'sDragon dictation along with smaller phrase technology. Any transcriptional errors that result from this process are unintentional.

## 2018-06-10 NOTE — Progress Notes (Signed)
ANTICOAGULATION CONSULT NOTE - Follow Up Consult  Pharmacy Consult for Heparin Indication: chest pain/ACS  Allergies  Allergen Reactions  . Penicillins Hives    Has patient had a PCN reaction causing immediate rash, facial/tongue/throat swelling, SOB or lightheadedness with hypotension: Unknown Has patient had a PCN reaction causing severe rash involving mucus membranes or skin necrosis: Unknown Has patient had a PCN reaction that required hospitalization: Unknown Has patient had a PCN reaction occurring within the last 10 years: No If all of the above answers are "NO", then may proceed with Cephalosporin use.  Childhood reported allergy    Patient Measurements: Height: 5\' 11"  (180.3 cm) Weight: 221 lb 14.4 oz (100.7 kg) IBW/kg (Calculated) : 70.8 Heparin Dosing Weight: 91.9 kg  Vital Signs: Temp: 98.7 F (37.1 C) (12/16 2051) Temp Source: Oral (12/16 2051) BP: 142/85 (12/16 2051) Pulse Rate: 84 (12/16 2051)  Labs: Recent Labs    06/08/18 0045  06/08/18 0329 06/08/18 [id number]  06/08/18 1325 06/09/18 0541 06/09/18 1425 06/09/18 2044 06/10/18 0626  HGB  --    < > 12.5  --   --   --  12.7  --   --  12.8  HCT  --   --  37.9  --   --   --   --   --   --  38.0  PLT  --   --  216  --   --   --  223  --   --  224  HEPARINUNFRC  --   --  0.33  --    < >  --  0.11* 0.26* 0.83* 0.73*  CREATININE  --   --  0.65  --   --   --  0.72  --   --  0.74  TROPONINI 0.26*  --   --  0.27*  --  0.24*  --   --   --   --    < > = values in this interval not displayed.    Estimated Creatinine Clearance: 124.6 mL/min (by C-G formula based on SCr of 0.74 mg/dL).   Medical History: Past Medical History:  Diagnosis Date  . GERD (gastroesophageal reflux disease)   . Tobacco abuse     Assessment: Patient is a 37yo female admitted for chest pain. Pharmacy consulted for Heparin dosing.  12/14 heparin started @ 1200 units/hr  12/15 AM heparin level 0.11. 2800 unit bolus and increase rate to  1500 units/hr. Recheck in 6 hours   12/15 14:25  HL = 0.26.  Bolus 1400 units. Increase drip rate to 1650 units/hr. Recheck HL tonight at 21:00.  Goal of Therapy:  Heparin level 0.3-0.7 units/ml Monitor platelets by anticoagulation protocol: Yes   Plan:  12/15 HL: 0.83. Level is supratherapeutic. Will decrease heparin infusion to 1550 units/hr. Recheck heparin level in 6 hours.  CBC with am labs per protocol.   12/16 AM heparin level 0.73, Decrease rate to 1450 units/hr and recheck in 6 hours.  12/16:  Pt had successful cardiac cath @ 11:30.  Per RN heparin gtt no longer running, no plans to restart heparin drip.  Pt had stent placed and is currently on brillinta.  Scherrie Gerlachobbins,Jacalynn Buzzell D, PharmD Clinical Pharmacist 06/10/2018 10:27 PM

## 2018-06-11 ENCOUNTER — Other Ambulatory Visit: Payer: Self-pay

## 2018-06-11 ENCOUNTER — Encounter: Payer: Self-pay | Admitting: Cardiovascular Disease

## 2018-06-11 LAB — HIV ANTIBODY (ROUTINE TESTING W REFLEX): HIV Screen 4th Generation wRfx: NONREACTIVE

## 2018-06-11 LAB — POCT ACTIVATED CLOTTING TIME: Activated Clotting Time: 274 seconds

## 2018-06-11 MED ORDER — METOPROLOL SUCCINATE ER 25 MG PO TB24: 12.5000 mg | ORAL_TABLET | Freq: Every day | ORAL | Status: DC

## 2018-06-11 MED ORDER — CLOPIDOGREL BISULFATE 75 MG PO TABS: 75.0000 mg | ORAL_TABLET | Freq: Every day | ORAL | 0 refills | Status: AC

## 2018-06-11 MED ORDER — CLOPIDOGREL BISULFATE 75 MG PO TABS
75.0000 mg | ORAL_TABLET | Freq: Every day | ORAL | Status: DC
  Administered 2018-06-11: 75 mg via ORAL
  Filled 2018-06-11: qty 1

## 2018-06-11 MED ORDER — ATORVASTATIN CALCIUM 40 MG PO TABS: 40.0000 mg | ORAL_TABLET | Freq: Every day | ORAL | 0 refills | Status: DC

## 2018-06-11 MED ORDER — ATORVASTATIN CALCIUM 20 MG PO TABS: 40.0000 mg | ORAL_TABLET | Freq: Every day | ORAL | Status: DC

## 2018-06-11 MED ORDER — TICAGRELOR 90 MG PO TABS: 90.0000 mg | ORAL_TABLET | Freq: Two times a day (BID) | ORAL | 0 refills | Status: DC

## 2018-06-11 MED ORDER — ASPIRIN 81 MG PO CHEW: 81.0000 mg | CHEWABLE_TABLET | Freq: Every day | ORAL | 0 refills | Status: DC

## 2018-06-11 MED ORDER — NICOTINE 14 MG/24HR TD PT24: MEDICATED_PATCH | TRANSDERMAL | 0 refills | Status: DC

## 2018-06-11 MED ORDER — METOPROLOL SUCCINATE ER 25 MG PO TB24: 12.5000 mg | ORAL_TABLET | Freq: Every day | ORAL | 0 refills | Status: DC

## 2018-06-11 NOTE — Progress Notes (Signed)
Cardiovascular and Pulmonary Nurse Navigator Note:   37 year old female with hx of tobacco dependence and GERD who presented to the ED with chest pain.  Patient ruled in for NSTEMI.  Patient underwent Cardiac Catheterization with PCI.  Coronary Stent to LAD.    "Heart Attack Bouncing Back" booklet given and reviewed with patient. Discussed the definition of CAD. Reviewed the location of CAD and where his stent was placed. Informed patient she will be given a stent card. Explained the purpose of the stent card. Instructed patient to keep stent card in her wallet.  ? Discussed modifiable risk factors including controlling blood pressure, cholesterol, and blood sugar; following heart healthy diet; maintaining healthy weight; exercise; and smoking cessation.  ? Discussed cardiac medications including rationale for taking, mechanisms of action, and side effects. Stressed the importance of taking medications as prescribed.  ? Discussed emergency plan for heart attack symptoms. Patient verbalized understanding of need to call 911 and not to drive herself to ER if having cardiac symptoms / chest pain.  ? Heart healthy diet of low sodium, low fat, low cholesterol heart healthy diet discussed. Information on diet provided.  ? Smoking Cessation - Patient is a current every day smoker.  Discussed the effects of smoking on the cardiovascular system.   "Thinking about Quitting - Yes You Can!" informational sheet reviewed with patient. Patient acknowledged that she needs to quit and stated, "It's up to me to quit."   ? Exercise - Benefits of exercised discussed. Informed patient that her cardiologist has referred her to outpatient Cardiac Rehab. An overview of the program was provided. Patient currently has no health insurance and stated she would not be able to pay out-of-pocket for this.  Patient does plan on applying for Medicaid.  This RN informed patient if she is approved for Medicaid then Cardiac Rehab would  be covered by Medicaid.  Patient stated she plans to apply this week.    Brochure for cardiac rehab given to patient.  Explained to patient the referral for Cardiac Rehab is good for one year from the time of her NSTEM.   ? Patient appreciative of the information.  ? Army Meliaiane , RN, BSN, [facility]  [place]  [facility]  Cardiovascular & Pulmonary Nurse Navigator  Direct Line: [phone]  Department Phone #: [phone] Fax: [phone]  Email Address: [email or web address]  ?? ?

## 2018-06-11 NOTE — Progress Notes (Signed)
Discharged to home with family members.  Prescriptions, work excuse letter, and education material provided.  Prescriptions were printed and given to her.  Follow up appointments made.

## 2018-06-11 NOTE — Progress Notes (Signed)
SUBJECTIVE: Patient is feeling much better and denies any chest pain   Vitals:   06/10/18 1802 06/10/18 2051 06/11/18 0550 06/11/18 0748  BP: (!) 141/92 (!) 142/85 123/79 112/67  Pulse: 71 84 66 82  Resp: (!) 33 20 18 18   Temp:  98.7 F (37.1 C) 98.6 F (37 C) 98.5 F (36.9 C)  TempSrc:  Oral Oral Oral  SpO2: 99% 100% 100% 100%  Weight:   101.8 kg   Height:        Intake/Output Summary (Last 24 hours) at 06/11/2018 1019 Last data filed at 06/11/2018 1009 Gross per 24 hour  Intake 1494.52 ml  Output 725 ml  Net 769.52 ml    LABS: Basic Metabolic Panel: Recent Labs    06/09/18 0541 06/10/18 0626  NA 137 136  K 3.7 3.7  CL 109 109  CO2 22 23  GLUCOSE 97 99  BUN 11 12  CREATININE 0.72 0.74  CALCIUM 8.7* 8.4*   Liver Function Tests: No results for input(s): AST, ALT, ALKPHOS, BILITOT, PROT, ALBUMIN in the last 72 hours. No results for input(s): LIPASE, AMYLASE in the last 72 hours. CBC: Recent Labs    06/09/18 0541 06/10/18 0626  WBC  --  9.6  HGB 12.7 12.8  HCT  --  38.0  MCV  --  86.8  PLT 223 224   Cardiac Enzymes: Recent Labs    06/08/18 1325  TROPONINI 0.24*   BNP: Invalid input(s): POCBNP D-Dimer: No results for input(s): DDIMER in the last 72 hours. Hemoglobin A1C: No results for input(s): HGBA1C in the last 72 hours. Fasting Lipid Panel: No results for input(s): CHOL, HDL, LDLCALC, TRIG, CHOLHDL, LDLDIRECT in the last 72 hours. Thyroid Function Tests: No results for input(s): TSH, T4TOTAL, T3FREE, THYROIDAB in the last 72 hours.  Invalid input(s): FREET3 Anemia Panel: No results for input(s): VITAMINB12, FOLATE, FERRITIN, TIBC, IRON, RETICCTPCT in the last 72 hours.   PHYSICAL EXAM General: Well developed, well nourished, in no acute distress HEENT:  Normocephalic and atramatic Neck:  No JVD.  Lungs: Clear bilaterally to auscultation and percussion. Heart: HRRR . Normal S1 and S2 without gallops or murmurs.  Abdomen: Bowel sounds  are positive, abdomen soft and non-tender  Msk:  Back normal, normal gait. Normal strength and tone for age. Extremities: No clubbing, cyanosis or edema.   Neuro: Alert and oriented X 3. Psych:  Good affect, responds appropriately  TELEMETRY: Sinus rhythm  ASSESSMENT AND PLAN: Non-STEMI with proximal LAD 90 to 95% disease status post PCI with drug-eluting stent.  Patient is doing very well right radial site appears to be okay.  Patient can be discharged on current medication with follow-up this Thursday at 10:00 in my office.  Principal Problem:   NSTEMI (non-ST elevated myocardial infarction) (HCC) Active Problems:   GERD (gastroesophageal reflux disease)   Tobacco abuse    KHAN,SHAUKAT A, MD, [facility] CenterFACC 06/11/2018 10:19 AM

## 2018-06-11 NOTE — Discharge Summary (Signed)
[employer] - [place] at [facility]   PATIENT NAME: Sonya [name]    MR#:  [id number]  DATE OF BIRTH:  August 20, 1980  DATE OF ADMISSION:  06/07/2018 ADMITTING PHYSICIAN: Oralia Manis, MD  DATE OF DISCHARGE: 06/11/2018 10:13 AM  PRIMARY CARE PHYSICIAN: [facility]    ADMISSION DIAGNOSIS:  Chest pain with high risk of acute coronary syndrome [R07.9]  DISCHARGE DIAGNOSIS:  Principal Problem:   NSTEMI (non-ST elevated myocardial infarction) (HCC) Active Problems:   GERD (gastroesophageal reflux disease)   Tobacco abuse   SECONDARY DIAGNOSIS:   Past Medical History:  Diagnosis Date  . GERD (gastroesophageal reflux disease)   . Tobacco abuse     HOSPITAL COURSE:   1.  NSTEMI.  Patient had a stent placed in the ostial LAD to proximal LAD.  Troponins were borderline during the hospital course.  Aspirin 81 mg daily.  Unable to do Brilinta because the patient does not have insurance.  I prescribed Plavix 75 mg daily.  I also prescribed Toprol-XL nightly and Lipitor 40 mg nightly. 2.  Tobacco abuse.  Smoking cessation done during the hospital course.  Nicotine patch prescribed.  I also advised her to stop smoking.  DISCHARGE CONDITIONS:   Satisfactory  CONSULTS OBTAINED:  Treatment Team:  Laurier Nancy, MD  DRUG ALLERGIES:   Allergies  Allergen Reactions  . Penicillins Hives    Has patient had a PCN reaction causing immediate rash, facial/tongue/throat swelling, SOB or lightheadedness with hypotension: Unknown Has patient had a PCN reaction causing severe rash involving mucus membranes or skin necrosis: Unknown Has patient had a PCN reaction that required hospitalization: Unknown Has patient had a PCN reaction occurring within the last 10 years: No If all of the above answers are "NO", then may proceed with Cephalosporin use.  Childhood reported allergy    DISCHARGE MEDICATIONS:   Allergies as of 06/11/2018      Reactions   Penicillins Hives   Has patient had a PCN reaction causing immediate rash, facial/tongue/throat swelling, SOB or lightheadedness with hypotension: Unknown Has patient had a PCN reaction causing severe rash involving mucus membranes or skin necrosis: Unknown Has patient had a PCN reaction that required hospitalization: Unknown Has patient had a PCN reaction occurring within the last 10 years: No If all of the above answers are "NO", then may proceed with Cephalosporin use. Childhood reported allergy      Medication List    STOP taking these medications   famotidine 20 MG tablet Commonly known as:  PEPCID   sucralfate 1 g tablet Commonly known as:  CARAFATE     TAKE these medications   aspirin 81 MG chewable tablet Commonly known as:  ASPIRIN CHILDRENS Chew 1 tablet (81 mg total) by mouth daily.   atorvastatin 40 MG tablet Commonly known as:  LIPITOR Take 1 tablet (40 mg total) by mouth daily at 6 PM.   clopidogrel 75 MG tablet Commonly known as:  PLAVIX Take 1 tablet (75 mg total) by mouth daily.   metoprolol succinate 25 MG 24 hr tablet Commonly known as:  TOPROL-XL Take 0.5 tablets (12.5 mg total) by mouth at bedtime.   nicotine 14 mg/24hr patch Commonly known as:  NICODERM CQ - dosed in mg/24 hours One patch transdermally daily (okay to substitute generic) Notes to patient:  ROTATE WHERE YOU PUT IT.        DISCHARGE INSTRUCTIONS:   Follow-up PMD 5 days Follow-up cardiology 1 week  If you  experience worsening of your admission symptoms, develop shortness of breath, life threatening emergency, suicidal or homicidal thoughts you must seek medical attention immediately by calling 911 or calling your MD immediately  if symptoms less severe.  You Must read complete instructions/literature along with all the possible adverse reactions/side effects for all the Medicines you take and that have been prescribed to you. Take any new Medicines after you have completely  understood and accept all the possible adverse reactions/side effects.   Please note  You were cared for by a hospitalist during your hospital stay. If you have any questions about your discharge medications or the care you received while you were in the hospital after you are discharged, you can call the unit and asked to speak with the hospitalist on call if the hospitalist that took care of you is not available. Once you are discharged, your primary care physician will handle any further medical issues. Please note that NO REFILLS for any discharge medications will be authorized once you are discharged, as it is imperative that you return to your primary care physician (or establish a relationship with a primary care physician if you do not have one) for your aftercare needs so that they can reassess your need for medications and monitor your lab values.    Today   CHIEF COMPLAINT:   Chief Complaint  Patient presents with  . Chest Pain    HISTORY OF PRESENT ILLNESS:  Sonya [name]  is a 37 y.o. female presented with chest pain.   VITAL SIGNS:  Blood pressure 112/67, pulse 82, temperature 98.5 F (36.9 C), temperature source Oral, resp. rate 18, height 5\' 11"  (1.803 m), weight 101.8 kg, SpO2 100 %.   PHYSICAL EXAMINATION:  GENERAL:  37 y.o.-year-old patient lying in the bed with no acute distress.  EYES: Pupils equal, round, reactive to light and accommodation. No scleral icterus. Extraocular muscles intact.  HEENT: Head atraumatic, normocephalic. Oropharynx and nasopharynx clear.  NECK:  Supple, no jugular venous distention. No thyroid enlargement, no tenderness.  LUNGS: Normal breath sounds bilaterally, no wheezing, rales,rhonchi or crepitation. No use of accessory muscles of respiration.  CARDIOVASCULAR: S1, S2 normal. No murmurs, rubs, or gallops.  ABDOMEN: Soft, non-tender, non-distended. Bowel sounds present. No organomegaly or mass.  EXTREMITIES: No pedal edema, cyanosis,  or clubbing.  NEUROLOGIC: Cranial nerves II through XII are intact. Muscle strength 5/5 in all extremities. Sensation intact. Gait not checked.  PSYCHIATRIC: The patient is alert and oriented x 3.  SKIN: No obvious rash, lesion, or ulcer.   DATA REVIEW:   CBC Recent Labs  Lab 06/10/18 0626  WBC 9.6  HGB 12.8  HCT 38.0  PLT 224    Chemistries  Recent Labs  Lab 06/10/18 0626  NA 136  K 3.7  CL 109  CO2 23  GLUCOSE 99  BUN 12  CREATININE 0.74  CALCIUM 8.4*    Cardiac Enzymes Recent Labs  Lab 06/08/18 1325  TROPONINI 0.24*     Management plans discussed with the patient, family and they are in agreement.  CODE STATUS:  Code Status History    Date Active Date Inactive Code Status Order ID Comments User Context   06/10/2018 1836 06/11/2018 1319 Full Code [id number]  Alwyn Pea, MD Inpatient   06/08/2018 0038 06/10/2018 1836 Full Code [id number]  Oralia Manis, MD Inpatient      TOTAL TIME TAKING CARE OF THIS PATIENT: 35 minutes.    Alford Highland M.D on 06/11/2018 at 2:20  PM  Between 7am to 6pm - Pager - [phone]  After 6pm go to [email or web address] - Social research officer, governmentpassword [email or web address]  [employer] Office  [phone]  CC: Primary care physician; [facility]

## 2018-06-11 NOTE — Care Management Note (Signed)
Case Management Note  Patient Details  Name: Sonya [name] MRN: [id number] Date of Birth: [birth year]  Subjective/Objective:     Patient from home with children.  Admitted with NSTEMI.  Cardiac catheterization with stent placement.  Cardiology planned on discharging patient on Brilinta.  She does not have insurance.  [facility] and spoke with Pharmacist there.  Brilinta would cost $116/mo.  Patient states she cannot afford that.  Notified Dr. Renae GlossWieting.  He will change to Plavix.  [facility]  Plavix is on the $4 list.  She states she can afford that.  She plans on applying for medicaid.  She is discharging today.  She is independent in all adl's.  No further needs identified at this time by CM team               Action/Plan:   Expected Discharge Date:  06/11/18               Expected Discharge Plan:  Home/Self Care  In-House Referral:     Discharge planning Services  CM Consult  Post Acute Care Choice:    Choice offered to:     DME Arranged:    DME Agency:     HH Arranged:    HH Agency:     Status of Service:  Completed, signed off  If discussed at [employer] Length of Stay Meetings, dates discussed:    Additional Comments:  Sherren KernsJennifer L Javel Hersh, RN 06/11/2018, 9:27 AM

## 2018-06-11 NOTE — Progress Notes (Signed)
Patient ID: Vonzell SchlatterZaneta L [name],   Work note.  Patient admitted 06/07/2018 to [facility] and discharged 06/11/2017.  Please excuse from work through 06/19/2017.  Patient will need follow-up appointments as outpatient.  Dr. Alford Highlandichard M[place] [phone]

## 2018-08-08 ENCOUNTER — Emergency Department: Payer: Medicaid Other

## 2018-08-08 ENCOUNTER — Other Ambulatory Visit: Payer: Self-pay

## 2018-08-08 ENCOUNTER — Encounter: Payer: Self-pay | Admitting: Emergency Medicine

## 2018-08-08 ENCOUNTER — Emergency Department
Admission: EM | Admit: 2018-08-08 | Discharge: 2018-08-08 | Discharge status: Against Medical Advice | Payer: Medicaid Other | Attending: Emergency Medicine | Admitting: Emergency Medicine

## 2018-08-08 DIAGNOSIS — Z5321 Procedure and treatment not carried out due to patient leaving prior to being seen by health care provider: Secondary | ICD-10-CM | POA: Insufficient documentation

## 2018-08-08 DIAGNOSIS — R079 Chest pain, unspecified: Secondary | ICD-10-CM | POA: Insufficient documentation

## 2018-08-08 HISTORY — DX: Atherosclerotic heart disease of native coronary artery without angina pectoris: I25.10

## 2018-08-08 LAB — COMPREHENSIVE METABOLIC PANEL
ALT: 16 U/L (ref 0–44)
AST: 18 U/L (ref 15–41)
Albumin: 3.6 g/dL (ref 3.5–5.0)
Alkaline Phosphatase: 63 U/L (ref 38–126)
Anion gap: 6 (ref 5–15)
BUN: 7 mg/dL (ref 6–20)
CO2: 21 mmol/L — ABNORMAL LOW (ref 22–32)
Calcium: 8.4 mg/dL — ABNORMAL LOW (ref 8.9–10.3)
Chloride: 109 mmol/L (ref 98–111)
Creatinine, Ser: 0.65 mg/dL (ref 0.44–1.00)
GFR calc Af Amer: >60 mL/min (ref >60)
GFR calc non Af Amer: >60 mL/min (ref >60)
Glucose, Bld: 109 mg/dL — ABNORMAL HIGH (ref 70–99)
Potassium: 3.2 mmol/L — ABNORMAL LOW (ref 3.5–5.1)
Sodium: 136 mmol/L (ref 135–145)
Total Bilirubin: 0.4 mg/dL (ref 0.3–1.2)
Total Protein: 6.8 g/dL (ref 6.5–8.1)

## 2018-08-08 LAB — CBC
HCT: 37.1 % (ref 36.0–46.0)
Hemoglobin: 12.6 g/dL (ref 12.0–15.0)
MCH: 29 pg (ref 26.0–34.0)
MCHC: 34 g/dL (ref 30.0–36.0)
MCV: 85.3 fL (ref 80.0–100.0)
Platelets: 272 10*3/uL (ref 150–400)
RBC: 4.35 MIL/uL (ref 3.87–5.11)
RDW: 13.9 % (ref 11.5–15.5)
WBC: 10.9 10*3/uL — ABNORMAL HIGH (ref 4.0–10.5)
nRBC: 0 % (ref 0.0–0.2)

## 2018-08-08 LAB — TROPONIN I: Troponin I: <0.03 ng/mL (ref <0.03)

## 2018-08-08 NOTE — ED Notes (Signed)
Pt sitting in lobby with no distress; pt informed that troponin will be repeated in an hour; pt voices good understanding

## 2018-08-08 NOTE — ED Notes (Signed)
No answer when called several times from lobby 

## 2018-08-08 NOTE — ED Triage Notes (Signed)
Pt in with co chest tightness that started this am, pt had MI in dec with cardiac stents in place. Pt states did have some shob with pain. Pt denies any cold symptoms.

## 2018-08-08 NOTE — ED Notes (Signed)
No answer when called from lobby 

## 2018-08-09 ENCOUNTER — Telehealth: Payer: Self-pay | Admitting: Emergency Medicine

## 2018-08-09 NOTE — Telephone Encounter (Signed)
Called patient due to lwot to inquire about condition and follow up plans. Left message.   

## 2019-06-13 ENCOUNTER — Other Ambulatory Visit: Payer: Self-pay

## 2019-06-13 ENCOUNTER — Encounter: Payer: Self-pay | Admitting: Emergency Medicine

## 2019-06-13 ENCOUNTER — Emergency Department
Admission: EM | Admit: 2019-06-13 | Discharge: 2019-06-15 | Disposition: A | Discharge status: Home / Self Care | Payer: Medicaid Other | Attending: Emergency Medicine | Admitting: Emergency Medicine

## 2019-06-13 DIAGNOSIS — J029 Acute pharyngitis, unspecified: Secondary | ICD-10-CM | POA: Diagnosis not present

## 2019-06-13 DIAGNOSIS — Z5321 Procedure and treatment not carried out due to patient leaving prior to being seen by health care provider: Secondary | ICD-10-CM | POA: Diagnosis not present

## 2019-06-13 NOTE — ED Notes (Signed)
Called in the waiting room with no answer. °

## 2019-06-13 NOTE — ED Notes (Addendum)
Called X1 no answer. First nurse aware.

## 2019-06-13 NOTE — ED Triage Notes (Signed)
Pt to ED via POV c/o sore throat and body aches since last night. Pt is in NAD.

## 2019-06-16 ENCOUNTER — Other Ambulatory Visit: Payer: Medicaid Other

## 2019-08-08 ENCOUNTER — Ambulatory Visit
Admission: RE | Admit: 2019-08-08 | Discharge: 2019-08-08 | Disposition: A | Discharge status: Home / Self Care | Payer: Medicaid Other | Source: Ambulatory Visit | Attending: Physician Assistant | Admitting: Physician Assistant

## 2019-08-08 ENCOUNTER — Other Ambulatory Visit: Payer: Self-pay

## 2019-08-08 ENCOUNTER — Other Ambulatory Visit: Payer: Self-pay | Admitting: Physician Assistant

## 2019-08-08 DIAGNOSIS — R2242 Localized swelling, mass and lump, left lower limb: Secondary | ICD-10-CM | POA: Insufficient documentation

## 2019-12-01 ENCOUNTER — Emergency Department
Admission: EM | Admit: 2019-12-01 | Discharge: 2019-12-01 | Disposition: A | Discharge status: Home / Self Care | Payer: Medicaid Other | Attending: Emergency Medicine | Admitting: Emergency Medicine

## 2019-12-01 ENCOUNTER — Other Ambulatory Visit: Payer: Self-pay

## 2019-12-01 DIAGNOSIS — M545 Low back pain: Secondary | ICD-10-CM | POA: Diagnosis present

## 2019-12-01 DIAGNOSIS — M5432 Sciatica, left side: Secondary | ICD-10-CM | POA: Diagnosis not present

## 2019-12-01 DIAGNOSIS — F17200 Nicotine dependence, unspecified, uncomplicated: Secondary | ICD-10-CM | POA: Insufficient documentation

## 2019-12-01 LAB — POCT PREGNANCY, URINE: Preg Test, Ur: NEGATIVE

## 2019-12-01 MED ORDER — KETOROLAC TROMETHAMINE 10 MG PO TABS: 10.0000 mg | ORAL_TABLET | Freq: Four times a day (QID) | ORAL | 0 refills | Status: DC | PRN

## 2019-12-01 MED ORDER — KETOROLAC TROMETHAMINE 60 MG/2ML IM SOLN
60.0000 mg | Freq: Once | INTRAMUSCULAR | Status: AC
  Administered 2019-12-01: 60 mg via INTRAMUSCULAR
  Filled 2019-12-01: qty 2

## 2019-12-01 MED ORDER — GABAPENTIN 300 MG PO CAPS
300.0000 mg | ORAL_CAPSULE | Freq: Once | ORAL | Status: AC
  Administered 2019-12-01: 300 mg via ORAL
  Filled 2019-12-01: qty 1

## 2019-12-01 MED ORDER — PREDNISONE 20 MG PO TABS
60.0000 mg | ORAL_TABLET | Freq: Once | ORAL | Status: AC
  Administered 2019-12-01: 60 mg via ORAL
  Filled 2019-12-01: qty 3

## 2019-12-01 MED ORDER — PREDNISONE 10 MG (21) PO TBPK: ORAL_TABLET | ORAL | 0 refills | Status: DC

## 2019-12-01 NOTE — Discharge Instructions (Addendum)
Please seek medical attention for any high fevers, chest pain, shortness of breath, change in behavior, persistent vomiting, bloody stool or any other new or concerning symptoms.  

## 2019-12-01 NOTE — ED Provider Notes (Signed)
[facility] Emergency Department Provider Note   ____________________________________________   I have reviewed the triage vital signs and the nursing notes.   HISTORY  Chief Complaint Back Pain   History limited by: Not Limited   HPI Sonya [name] is a 39 y.o. female who presents to the emergency department today because of concerns for left lower back and leg pain.  Patient states the symptoms started 1 week ago.  She denies any trauma or unusual activity prior to the pain starting.  She states that the pain has gradually gotten worse over the past week.  It is now fairly severe.  She is having a hard time even getting out of bed.  The patient says the pain does radiate down her left leg.  She does have a history of sciatica however this does not exactly remind her of her previous episodes of sciatica.  Patient denies any change in urination or defecation.  Patient denies any recent fevers.  She has tried taking some Tylenol muscle relaxers for the pain without any relief.   Records reviewed. Per medical record review patient has a history of CAD  Past Medical History:  Diagnosis Date  . Coronary artery disease   . GERD (gastroesophageal reflux disease)   . Tobacco abuse     Patient Active Problem List   Diagnosis Date Noted  . NSTEMI (non-ST elevated myocardial infarction) (HCC) 06/07/2018  . GERD (gastroesophageal reflux disease) 06/07/2018  . Tobacco abuse 06/07/2018    Past Surgical History:  Procedure Laterality Date  . CORONARY STENT INTERVENTION N/A 06/10/2018   Procedure: CORONARY STENT INTERVENTION;  Surgeon: Alwyn Pea, MD;  Location: [facility] INVASIVE CV LAB;  Service: Cardiovascular;  Laterality: N/A;  . LEFT HEART CATH AND CORONARY ANGIOGRAPHY N/A 06/10/2018   Procedure: With coronary intervention and stenting;  Surgeon: Laurier Nancy, MD;  Location: [facility] INVASIVE CV LAB;  Service: Cardiovascular;  Laterality: N/A;  . TUBAL  LIGATION      Prior to Admission medications   Medication Sig Start Date End Date Taking? Authorizing Provider  aspirin (ASPIRIN CHILDRENS) 81 MG chewable tablet Chew 1 tablet (81 mg total) by mouth daily. 06/11/18   Alford Highland, MD  atorvastatin (LIPITOR) 40 MG tablet Take 1 tablet (40 mg total) by mouth daily at 6 PM. 06/11/18   Alford Highland, MD  metoprolol succinate (TOPROL-XL) 25 MG 24 hr tablet Take 0.5 tablets (12.5 mg total) by mouth at bedtime. 06/11/18   Alford Highland, MD  nicotine (NICODERM CQ - DOSED IN MG/24 HOURS) 14 mg/24hr patch One patch transdermally daily (okay to substitute generic) 06/11/18   Alford Highland, MD    Allergies Penicillins  Family History  Problem Relation Age of Onset  . Heart attack Mother   . Heart attack Father     Social History Social History   Tobacco Use  . Smoking status: Current Every Day Smoker  . Smokeless tobacco: Never Used  Substance Use Topics  . Alcohol use: Yes    Comment: occasionally  . Drug use: Not on file    Review of Systems Constitutional: No fever/chills Eyes: No visual changes. ENT: No sore throat. Cardiovascular: Denies chest pain. Respiratory: Denies shortness of breath. Gastrointestinal: No abdominal pain.  No nausea, no vomiting.  No diarrhea.   Genitourinary: Negative for dysuria. Musculoskeletal: Positive for left lower back pain, left leg pain. Skin: Negative for rash. Neurological: Negative for headaches, focal weakness or numbness.  ____________________________________________   PHYSICAL EXAM:  VITAL SIGNS: ED Triage Vitals  Enc Vitals Group     BP 12/01/19 0454 132/82     Pulse Rate 12/01/19 0454 73     Resp 12/01/19 0454 20     Temp 12/01/19 0454 98.8 F (37.1 C)     Temp Source 12/01/19 0454 Oral     SpO2 12/01/19 0454 96 %     Weight --      Height --      Head Circumference --      Peak Flow --      Pain Score 12/01/19 0457 10   Constitutional: Alert and oriented.   Eyes: Conjunctivae are normal.  ENT      Head: Normocephalic and atraumatic.      Nose: No congestion/rhinnorhea.      Mouth/Throat: Mucous membranes are moist.      Neck: No stridor. Hematological/Lymphatic/Immunilogical: No cervical lymphadenopathy. Cardiovascular: Normal rate, regular rhythm.  No murmurs, rubs, or gallops.  Respiratory: Normal respiratory effort without tachypnea nor retractions. Breath sounds are clear and equal bilaterally. No wheezes/rales/rhonchi. Gastrointestinal: Soft and non tender. No rebound. No guarding.  Genitourinary: Deferred Musculoskeletal: Normal range of motion in all extremities. No lower extremity edema. Neurologic:  Normal speech and language. No gross focal neurologic deficits are appreciated.  Skin:  Skin is warm, dry and intact. No rash noted. Psychiatric: Mood and affect are normal. Speech and behavior are normal. Patient exhibits appropriate insight and judgment.  ____________________________________________    LABS (pertinent positives/negatives)  Upreg negative  ____________________________________________   EKG  None  ____________________________________________    RADIOLOGY  None   ____________________________________________   PROCEDURES  Procedures  ____________________________________________   INITIAL IMPRESSION / ASSESSMENT AND PLAN / ED COURSE  Pertinent labs & imaging results that were available during my care of the patient were reviewed by me and considered in my medical decision making (see chart for details).   Patient presented to the emergency department today because of concerns for left lower back pain that extends down into her leg.  No urinary or defecation issues.  This time I doubt spinal cord compression.  I do think patient likely suffering from sciatica.  Discussed this with patient.  Will try medication to gain pain control.  ____________________________________________   FINAL CLINICAL  IMPRESSION(S) / ED DIAGNOSES  Final diagnoses:  Sciatica of left side     Note: This dictation was prepared with Dragon dictation. Any transcriptional errors that result from this process are unintentional     Nance Pear, MD 12/01/19 [phone]

## 2019-12-01 NOTE — ED Notes (Signed)
Pt states pain 9/10. Provider notified

## 2019-12-01 NOTE — ED Notes (Signed)
Pt states that she las lower back pain 10/10 and that she gets a shooting pain down the left leg. Pt states history of sciatica pain, but that this does not feel like a typical flare up. Pt states taking tylenol 1000mg  last night at 8pm

## 2019-12-01 NOTE — ED Provider Notes (Signed)
Patient reports improvement in pain, will proceed with discharge    Emily Filbert, MD 12/01/19 [phone]

## 2019-12-01 NOTE — ED Triage Notes (Signed)
Patient reports severe back pain that radiates down left leg.  Patient reports pain for a week that has gotten progressively worse.  Patient reports history of sciatic pain.

## 2020-06-08 ENCOUNTER — Ambulatory Visit: Payer: Self-pay | Admitting: Cardiovascular Disease

## 2020-06-08 DIAGNOSIS — I200 Unstable angina: Secondary | ICD-10-CM | POA: Insufficient documentation

## 2020-06-08 MED ORDER — SODIUM CHLORIDE 0.9% FLUSH
3.0000 mL | Freq: Two times a day (BID) | INTRAVENOUS | Status: DC
  Filled 2020-06-08: qty 3

## 2020-06-11 ENCOUNTER — Other Ambulatory Visit: Payer: Self-pay

## 2020-06-11 ENCOUNTER — Other Ambulatory Visit
Admission: RE | Admit: 2020-06-11 | Discharge: 2020-06-11 | Disposition: A | Discharge status: Home / Self Care | Payer: Medicaid Other | Source: Ambulatory Visit | Attending: Cardiovascular Disease | Admitting: Cardiovascular Disease

## 2020-06-11 DIAGNOSIS — Z01812 Encounter for preprocedural laboratory examination: Secondary | ICD-10-CM | POA: Diagnosis not present

## 2020-06-11 DIAGNOSIS — Z20822 Contact with and (suspected) exposure to covid-19: Secondary | ICD-10-CM | POA: Insufficient documentation

## 2020-06-11 LAB — SARS CORONAVIRUS 2 (TAT 6-24 HRS): SARS Coronavirus 2: NEGATIVE

## 2020-06-15 ENCOUNTER — Encounter
Admission: RE | Disposition: A | Discharge status: Home / Self Care | Payer: Self-pay | Source: Home / Self Care | Attending: Cardiovascular Disease

## 2020-06-15 ENCOUNTER — Other Ambulatory Visit: Payer: Self-pay

## 2020-06-15 ENCOUNTER — Encounter: Payer: Self-pay | Admitting: Cardiovascular Disease

## 2020-06-15 ENCOUNTER — Ambulatory Visit
Admission: RE | Admit: 2020-06-15 | Discharge: 2020-06-15 | Disposition: A | Discharge status: Home / Self Care | Payer: Medicaid Other | Attending: Cardiovascular Disease | Admitting: Cardiovascular Disease

## 2020-06-15 DIAGNOSIS — I200 Unstable angina: Secondary | ICD-10-CM

## 2020-06-15 DIAGNOSIS — I25110 Atherosclerotic heart disease of native coronary artery with unstable angina pectoris: Secondary | ICD-10-CM | POA: Insufficient documentation

## 2020-06-15 HISTORY — PX: LEFT HEART CATH AND CORONARY ANGIOGRAPHY: CATH118249

## 2020-06-15 LAB — PREGNANCY, URINE: Preg Test, Ur: NEGATIVE

## 2020-06-15 SURGERY — LEFT HEART CATH AND CORONARY ANGIOGRAPHY
Anesthesia: Moderate Sedation | Laterality: Left

## 2020-06-15 MED ORDER — HEPARIN (PORCINE) IN NACL 1000-0.9 UT/500ML-% IV SOLN
INTRAVENOUS | Status: AC
  Filled 2020-06-15: qty 1000

## 2020-06-15 MED ORDER — ASPIRIN 81 MG PO CHEW: 81.0000 mg | CHEWABLE_TABLET | ORAL | Status: DC

## 2020-06-15 MED ORDER — FENTANYL CITRATE (PF) 100 MCG/2ML IJ SOLN
INTRAMUSCULAR | Status: AC
  Filled 2020-06-15: qty 2

## 2020-06-15 MED ORDER — ONDANSETRON HCL 4 MG/2ML IJ SOLN: 4.0000 mg | Freq: Four times a day (QID) | INTRAMUSCULAR | Status: DC | PRN

## 2020-06-15 MED ORDER — SODIUM CHLORIDE 0.9 % IV SOLN: 250.0000 mL | INTRAVENOUS | Status: DC | PRN

## 2020-06-15 MED ORDER — MIDAZOLAM HCL 2 MG/2ML IJ SOLN
INTRAMUSCULAR | Status: DC | PRN
  Administered 2020-06-15: 1 mg via INTRAVENOUS

## 2020-06-15 MED ORDER — SODIUM CHLORIDE 0.9% FLUSH: 3.0000 mL | INTRAVENOUS | Status: DC | PRN

## 2020-06-15 MED ORDER — MIDAZOLAM HCL 2 MG/2ML IJ SOLN
INTRAMUSCULAR | Status: AC
  Filled 2020-06-15: qty 2

## 2020-06-15 MED ORDER — HEPARIN (PORCINE) IN NACL 1000-0.9 UT/500ML-% IV SOLN
INTRAVENOUS | Status: DC | PRN
  Administered 2020-06-15: 500 mL

## 2020-06-15 MED ORDER — FENTANYL CITRATE (PF) 100 MCG/2ML IJ SOLN
INTRAMUSCULAR | Status: DC | PRN
  Administered 2020-06-15: 50 ug via INTRAVENOUS

## 2020-06-15 MED ORDER — LABETALOL HCL 5 MG/ML IV SOLN
10.0000 mg | INTRAVENOUS | Status: DC | PRN
Start: 2020-06-15 — End: 2020-06-15

## 2020-06-15 MED ORDER — LIDOCAINE HCL (PF) 1 % IJ SOLN
INTRAMUSCULAR | Status: DC | PRN
  Administered 2020-06-15: 30 mL

## 2020-06-15 MED ORDER — SODIUM CHLORIDE 0.9 % WEIGHT BASED INFUSION: 1.0000 mL/kg/h | INTRAVENOUS | Status: DC

## 2020-06-15 MED ORDER — ACETAMINOPHEN 325 MG PO TABS: 650.0000 mg | ORAL_TABLET | ORAL | Status: DC | PRN

## 2020-06-15 MED ORDER — SODIUM CHLORIDE 0.9 % WEIGHT BASED INFUSION
3.0000 mL/kg/h | INTRAVENOUS | Status: DC
  Administered 2020-06-15: 3 mL/kg/h via INTRAVENOUS

## 2020-06-15 MED ORDER — IOHEXOL 300 MG/ML  SOLN
INTRAMUSCULAR | Status: DC | PRN
  Administered 2020-06-15: 80 mL

## 2020-06-15 MED ORDER — LIDOCAINE HCL (PF) 1 % IJ SOLN
INTRAMUSCULAR | Status: AC
  Filled 2020-06-15: qty 30

## 2020-06-15 MED ORDER — SODIUM CHLORIDE 0.9% FLUSH: 3.0000 mL | Freq: Two times a day (BID) | INTRAVENOUS | Status: DC

## 2020-06-15 MED ORDER — HYDRALAZINE HCL 20 MG/ML IJ SOLN: 10.0000 mg | INTRAMUSCULAR | Status: DC | PRN

## 2020-06-15 SURGICAL SUPPLY — 8 items
CATH INFINITI 5FR JL4 (CATHETERS) ×2 IMPLANT
CATH INFINITI JR4 5F (CATHETERS) ×2 IMPLANT
DEVICE CLOSURE MYNXGRIP 5F (Vascular Products) ×2 IMPLANT
KIT MANI 3VAL PERCEP (MISCELLANEOUS) ×2 IMPLANT
NEEDLE PERC 18GX7CM (NEEDLE) ×2 IMPLANT
PACK CARDIAC CATH (CUSTOM PROCEDURE TRAY) ×2 IMPLANT
SHEATH AVANTI 5FR X 11CM (SHEATH) ×2 IMPLANT
WIRE GUIDERIGHT .035X150 (WIRE) ×2 IMPLANT

## 2020-06-15 NOTE — Progress Notes (Signed)
Dr. Adrian Blackwater at bedside updating patient and boyfriend Jonny Ruiz, and discussing findings and plan for medical management.  To follow up in one week at office.

## 2020-06-16 ENCOUNTER — Encounter: Payer: Self-pay | Admitting: Cardiovascular Disease

## 2020-06-29 ENCOUNTER — Emergency Department
Admission: EM | Admit: 2020-06-29 | Discharge: 2020-06-29 | Disposition: A | Discharge status: Home / Self Care | Payer: Medicaid Other | Attending: Emergency Medicine | Admitting: Emergency Medicine

## 2020-06-29 ENCOUNTER — Encounter: Payer: Self-pay | Admitting: Emergency Medicine

## 2020-06-29 ENCOUNTER — Other Ambulatory Visit: Payer: Self-pay

## 2020-06-29 DIAGNOSIS — I25110 Atherosclerotic heart disease of native coronary artery with unstable angina pectoris: Secondary | ICD-10-CM | POA: Insufficient documentation

## 2020-06-29 DIAGNOSIS — M25562 Pain in left knee: Secondary | ICD-10-CM | POA: Insufficient documentation

## 2020-06-29 DIAGNOSIS — M25561 Pain in right knee: Secondary | ICD-10-CM | POA: Diagnosis not present

## 2020-06-29 DIAGNOSIS — Z79899 Other long term (current) drug therapy: Secondary | ICD-10-CM | POA: Insufficient documentation

## 2020-06-29 DIAGNOSIS — M545 Low back pain, unspecified: Secondary | ICD-10-CM | POA: Diagnosis not present

## 2020-06-29 DIAGNOSIS — Z7982 Long term (current) use of aspirin: Secondary | ICD-10-CM | POA: Insufficient documentation

## 2020-06-29 DIAGNOSIS — Y92410 Unspecified street and highway as the place of occurrence of the external cause: Secondary | ICD-10-CM | POA: Diagnosis not present

## 2020-06-29 DIAGNOSIS — F17200 Nicotine dependence, unspecified, uncomplicated: Secondary | ICD-10-CM | POA: Insufficient documentation

## 2020-06-29 DIAGNOSIS — Z955 Presence of coronary angioplasty implant and graft: Secondary | ICD-10-CM | POA: Diagnosis not present

## 2020-06-29 MED ORDER — BACLOFEN 5 MG PO TABS: 5.0000 mg | ORAL_TABLET | Freq: Three times a day (TID) | ORAL | 0 refills | Status: DC | PRN

## 2020-06-29 MED ORDER — ACETAMINOPHEN 500 MG PO TABS: 500.0000 mg | ORAL_TABLET | Freq: Four times a day (QID) | ORAL | 0 refills | Status: DC | PRN
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
## 2020-11-05 ENCOUNTER — Emergency Department
Admission: EM | Admit: 2020-11-05 | Discharge: 2020-11-05 | Disposition: A | Discharge status: Home / Self Care | Payer: BC Managed Care – PPO | Attending: Emergency Medicine | Admitting: Emergency Medicine

## 2020-11-05 ENCOUNTER — Emergency Department: Payer: BC Managed Care – PPO

## 2020-11-05 ENCOUNTER — Other Ambulatory Visit: Payer: Self-pay

## 2020-11-05 DIAGNOSIS — K029 Dental caries, unspecified: Secondary | ICD-10-CM

## 2020-11-05 DIAGNOSIS — I2510 Atherosclerotic heart disease of native coronary artery without angina pectoris: Secondary | ICD-10-CM | POA: Diagnosis not present

## 2020-11-05 DIAGNOSIS — R220 Localized swelling, mass and lump, head: Secondary | ICD-10-CM

## 2020-11-05 DIAGNOSIS — F17200 Nicotine dependence, unspecified, uncomplicated: Secondary | ICD-10-CM | POA: Diagnosis not present

## 2020-11-05 DIAGNOSIS — Z7982 Long term (current) use of aspirin: Secondary | ICD-10-CM | POA: Insufficient documentation

## 2020-11-05 DIAGNOSIS — Z7902 Long term (current) use of antithrombotics/antiplatelets: Secondary | ICD-10-CM | POA: Insufficient documentation

## 2020-11-05 DIAGNOSIS — L03211 Cellulitis of face: Secondary | ICD-10-CM | POA: Diagnosis not present

## 2020-11-05 LAB — CBC WITH DIFFERENTIAL/PLATELET
Abs Immature Granulocytes: 0.04 10*3/uL (ref 0.00–0.07)
Basophils Absolute: 0 10*3/uL (ref 0.0–0.1)
Basophils Relative: 0 %
Eosinophils Absolute: 0.1 10*3/uL (ref 0.0–0.5)
Eosinophils Relative: 0 %
HCT: 34.9 % — ABNORMAL LOW (ref 36.0–46.0)
Hemoglobin: 11.7 g/dL — ABNORMAL LOW (ref 12.0–15.0)
Immature Granulocytes: 0 %
Lymphocytes Relative: 24 %
Lymphs Abs: 3.1 10*3/uL (ref 0.7–4.0)
MCH: 28.3 pg (ref 26.0–34.0)
MCHC: 33.5 g/dL (ref 30.0–36.0)
MCV: 84.3 fL (ref 80.0–100.0)
Monocytes Absolute: 0.8 10*3/uL (ref 0.1–1.0)
Monocytes Relative: 6 %
Neutro Abs: 8.8 10*3/uL — ABNORMAL HIGH (ref 1.7–7.7)
Neutrophils Relative %: 70 %
Platelets: 222 10*3/uL (ref 150–400)
RBC: 4.14 MIL/uL (ref 3.87–5.11)
RDW: 15 % (ref 11.5–15.5)
WBC: 12.8 10*3/uL — ABNORMAL HIGH (ref 4.0–10.5)
nRBC: 0 % (ref 0.0–0.2)

## 2020-11-05 LAB — BASIC METABOLIC PANEL
Anion gap: 8 (ref 5–15)
BUN: 9 mg/dL (ref 6–20)
CO2: 20 mmol/L — ABNORMAL LOW (ref 22–32)
Calcium: 8.6 mg/dL — ABNORMAL LOW (ref 8.9–10.3)
Chloride: 108 mmol/L (ref 98–111)
Creatinine, Ser: 0.91 mg/dL (ref 0.44–1.00)
GFR, Estimated: >60 mL/min (ref >60)
Glucose, Bld: 107 mg/dL — ABNORMAL HIGH (ref 70–99)
Potassium: 3.5 mmol/L (ref 3.5–5.1)
Sodium: 136 mmol/L (ref 135–145)

## 2020-11-05 LAB — POC URINE PREG, ED: Preg Test, Ur: NEGATIVE

## 2020-11-05 MED ORDER — OXYCODONE-ACETAMINOPHEN 5-325 MG PO TABS
2.0000 | ORAL_TABLET | Freq: Four times a day (QID) | ORAL | 0 refills | Status: DC | PRN
Start: 2020-11-05 — End: 2021-04-15

## 2020-11-05 MED ORDER — ONDANSETRON 4 MG PO TBDP: 4.0000 mg | ORAL_TABLET | Freq: Four times a day (QID) | ORAL | 0 refills | Status: DC | PRN

## 2020-11-05 MED ORDER — SODIUM CHLORIDE 0.9 % IV BOLUS (SEPSIS)
1000.0000 mL | Freq: Once | INTRAVENOUS | Status: AC
  Administered 2020-11-05: 1000 mL via INTRAVENOUS

## 2020-11-05 MED ORDER — CLINDAMYCIN PHOSPHATE 900 MG/50ML IV SOLN
900.0000 mg | Freq: Once | INTRAVENOUS | Status: AC
  Administered 2020-11-05: 900 mg via INTRAVENOUS
  Filled 2020-11-05: qty 50

## 2020-11-05 MED ORDER — PROBIOTIC 1-250 BILLION-MG PO CAPS: 1.0000 | ORAL_CAPSULE | Freq: Every day | ORAL | 0 refills | Status: AC

## 2020-11-05 MED ORDER — KETOROLAC TROMETHAMINE 30 MG/ML IJ SOLN
30.0000 mg | Freq: Once | INTRAMUSCULAR | Status: AC
  Administered 2020-11-05: 30 mg via INTRAVENOUS
  Filled 2020-11-05: qty 1

## 2020-11-05 MED ORDER — IBUPROFEN 800 MG PO TABS: 800.0000 mg | ORAL_TABLET | Freq: Three times a day (TID) | ORAL | 0 refills | Status: DC | PRN

## 2020-11-05 NOTE — ED Triage Notes (Signed)
Pt states was started on antibiotics for tooth infection and now has swelling to upper lip and right side of face.

## 2020-11-05 NOTE — ED Provider Notes (Signed)
[facility] Emergency Department Provider Note  ____________________________________________   Event Date/Time   First MD Initiated Contact with Patient 11/05/20 0133     (approximate)  I have reviewed the triage vital signs and the nursing notes.   HISTORY  Chief Complaint Abscess    HPI Sonya [name] is a 40 y.o. female with history of CAD who presents to the emergency department with complaints of right-sided facial swelling for the past 2 days.  Was seen by her dentist yesterday and was told that tooth #7 was infected.  She was started on clindamycin and has had 1 dose.  States her facial swelling started to increase and she is having increasing pain.  She had to leave work tonight due to pain.  No fevers, vomiting or diarrhea.  She states that she has been trying to find an Transport planner that can see her soon.  She states the earliest anyone can see her is in June.        Past Medical History:  Diagnosis Date  . Coronary artery disease   . GERD (gastroesophageal reflux disease)   . Tobacco abuse     Patient Active Problem List   Diagnosis Date Noted  . Unstable angina ([facility]) 06/08/2020  . NSTEMI (non-ST elevated myocardial infarction) ([facility]) 06/07/2018  . GERD (gastroesophageal reflux disease) 06/07/2018  . Tobacco abuse 06/07/2018    Past Surgical History:  Procedure Laterality Date  . CORONARY STENT INTERVENTION N/A 06/10/2018   Procedure: CORONARY STENT INTERVENTION;  Surgeon: Alwyn Pea, MD;  Location: [facility] INVASIVE CV LAB;  Service: Cardiovascular;  Laterality: N/A;  . LEFT HEART CATH AND CORONARY ANGIOGRAPHY N/A 06/10/2018   Procedure: With coronary intervention and stenting;  Surgeon: Laurier Nancy, MD;  Location: [facility] INVASIVE [facility] LAB;  Service: Cardiovascular;  Laterality: N/A;  . LEFT HEART CATH AND CORONARY ANGIOGRAPHY Left 06/15/2020   Procedure: LEFT HEART CATH AND CORONARY ANGIOGRAPHY;  Surgeon: Laurier Nancy, MD;   Location: [facility] INVASIVE CV LAB;  Service: Cardiovascular;  Laterality: Left;  . TUBAL LIGATION      Prior to Admission medications   Medication Sig Start Date End Date Taking? Authorizing Provider  Bacillus Coagulans-Inulin (PROBIOTIC) 1-250 BILLION-MG CAPS Take 1 capsule by mouth daily. 11/05/20  Yes Anuoluwapo Mefferd, Layla Maw, DO  ibuprofen (ADVIL) 800 MG tablet Take 1 tablet (800 mg total) by mouth every 8 (eight) hours as needed for mild pain. 11/05/20  Yes Kielee Care, Baxter Hire N, DO  ondansetron (ZOFRAN ODT) 4 MG disintegrating tablet Take 1 tablet (4 mg total) by mouth every 6 (six) hours as needed for nausea or vomiting. 11/05/20  Yes Azaylea Maves, Layla Maw, DO  oxyCODONE-acetaminophen (PERCOCET) 5-325 MG tablet Take 2 tablets by mouth every 6 (six) hours as needed for severe pain. 11/05/20 11/05/21 Yes Marissia Blackham, Layla Maw, DO  acetaminophen (TYLENOL) 500 MG tablet Take 1 tablet (500 mg total) by mouth every 6 (six) hours as needed. 06/29/20   Enid Derry, PA-C  aspirin (ASPIRIN CHILDRENS) 81 MG chewable tablet Chew 1 tablet (81 mg total) by mouth daily. 06/11/18   Alford Highland, MD  Baclofen 5 MG TABS Take 5 mg by mouth 3 (three) times daily as needed. 06/29/20   Enid Derry, PA-C  carvedilol (COREG) 25 MG tablet Take 25 mg by mouth 2 (two) times daily. 05/26/20   [provider]  clobetasol cream (TEMOVATE) 0.05 % Apply 1 application topically daily as needed for rash. Patient not taking: Reported on 06/15/2020  05/29/20   [provider]  clopidogrel (PLAVIX) 75 MG tablet Take 75 mg by mouth daily. 02/19/20   [provider]  doxycycline (MONODOX) 100 MG capsule Take 100 mg by mouth 2 (two) times daily. Patient not taking: No sig reported 05/29/20   [provider]  enalapril (VASOTEC) 20 MG tablet Take 20 mg by mouth daily. 06/08/20   [provider]  gabapentin (NEURONTIN) 300 MG capsule Take 300 mg by mouth 2 (two) times daily as needed for pain. 01/13/20   [provider]  ketorolac (TORADOL) 10 MG tablet Take 1 tablet (10 mg total) by mouth every 6 (six) hours as needed. Patient taking differently: Take 10 mg by mouth every 6 (six) hours as needed for moderate pain. 12/01/19   Emily FilbertWilliams, Jonathan E, MD  pantoprazole (PROTONIX) 20 MG tablet Take 20 mg by mouth daily as needed for heartburn. Patient not taking: Reported on 06/15/2020 02/24/20   [provider]  pravastatin (PRAVACHOL) 40 MG tablet Take 40 mg by mouth daily. 05/26/20   [provider]  ranolazine (RANEXA) 1000 MG SR tablet Take 1,000 mg by mouth 2 (two) times daily. 06/08/20   [provider]  spironolactone (ALDACTONE) 25 MG tablet Take 25 mg by mouth daily. 05/26/20   [provider]    Allergies Penicillins  Family History  Problem Relation Age of Onset  . Heart attack Mother   . Heart attack Father     Social History Social History   Tobacco Use  . Smoking status: Current Every Day Smoker    Packs/day: 0.50  . Smokeless tobacco: Never Used  Vaping Use  . Vaping Use: Never used  Substance Use Topics  . Alcohol use: Yes    Comment: occasionally  . Drug use: Never    Review of Systems Constitutional: No fever. Eyes: No visual changes. ENT: No sore throat. Cardiovascular: Denies chest pain. Respiratory: Denies shortness of breath. Gastrointestinal: No nausea, vomiting, diarrhea. Genitourinary: Negative for dysuria. Musculoskeletal: Negative for back pain. Skin: Negative for rash. Neurological: Negative for focal weakness or numbness.  ____________________________________________   PHYSICAL EXAM:  VITAL SIGNS: ED Triage Vitals [11/05/20 0130]  Enc Vitals Group     BP (!) 150/105     Pulse Rate 80     Resp 20     Temp 99.1 F (37.3 C)     Temp Source Oral     SpO2 99 %     Weight 256 lb (116.1 kg)     Height 5\' 10"  (1.778 m)     Head Circumference      Peak Flow      Pain Score 10     Pain Loc      Pain Edu?       Excl. in GC?    CONSTITUTIONAL: Alert and oriented and responds appropriately to questions. Well-appearing; well-nourished, appears uncomfortable but is nontoxic and not in significant distress, afebrile HEAD: Normocephalic EYES: Conjunctivae clear, pupils appear equal, EOM appear intact ENT: normal nose; moist mucous membranes, no trismus or drooling, dental caries noted, no drainable abscess seen on exam, right-sided facial swelling noted that is also seen in the right upper lip, normal phonation, tongue sits flat in the bottom of the mouth, no signs of Ludwig's angina NECK: Supple, normal ROM CARD: RRR; S1 and S2 appreciated; no murmurs, no clicks, no rubs, no gallops RESP: Normal chest excursion without splinting or tachypnea; breath sounds clear and equal bilaterally; no wheezes,  no rhonchi, no rales, no hypoxia or respiratory distress, speaking full sentences ABD/GI: Normal bowel sounds; non-distended; soft, non-tender, no rebound, no guarding, no peritoneal signs, no hepatosplenomegaly BACK: The back appears normal EXT: Normal ROM in all joints; no deformity noted, no edema; no cyanosis SKIN: Normal color for age and race; warm; no rash on exposed skin NEURO: Moves all extremities equally PSYCH: The patient's mood and manner are appropriate.  ____________________________________________   LABS (all labs ordered are listed, but only abnormal results are displayed)  Labs Reviewed  CBC WITH DIFFERENTIAL/PLATELET - Abnormal; Notable for the following components:      Result Value   WBC 12.8 (*)    Hemoglobin 11.7 (*)    HCT 34.9 (*)    Neutro Abs 8.8 (*)    All other components within normal limits  BASIC METABOLIC PANEL - Abnormal; Notable for the following components:   CO2 20 (*)    Glucose, Bld 107 (*)    Calcium 8.6 (*)    All other components within normal limits  POC URINE PREG, ED    ____________________________________________  EKG   ____________________________________________  RADIOLOGY I, Etai Copado, personally viewed and evaluated these images (plain radiographs) as part of my medical decision making, as well as reviewing the written report by the radiologist.  ED MD interpretation: Facial cellulitis.  Official radiology report(s): CT Maxillofacial Wo Contrast  Result Date: 11/05/2020 CLINICAL DATA:  40 year old female with facial abscess. Swelling to the right upper lip. EXAM: CT MAXILLOFACIAL WITHOUT CONTRAST TECHNIQUE: Multidetector CT imaging of the maxillofacial structures was performed. Multiplanar CT image reconstructions were also generated. COMPARISON:  None. FINDINGS: Osseous: Old fracture of the left zygomatic arch. No acute fracture. No mandibular subluxation. Orbits: The globes and retro-orbital fat are preserved. Sinuses: Mild mucoperiosteal thickening of paranasal sinuses. Small left sphenoid sinus retention cyst or polyp. No air-fluid level. The mastoid air cells are clear. Soft tissues: There is mild induration of the subcutaneous soft tissues of the right side of the face. No fluid collection or abscess. Limited intracranial: No significant or unexpected finding. IMPRESSION: Mild induration of the subcutaneous soft tissues of the right side of the face. No fluid collection or abscess. Electronically Signed   By: Elgie Collard M.D.   On: 11/05/2020 03:31    ____________________________________________   PROCEDURES  Procedure(s) performed (including Critical Care):  Procedures  ____________________________________________   INITIAL IMPRESSION / ASSESSMENT AND PLAN / ED COURSE  As part of my medical decision making, I reviewed the following data within the electronic MEDICAL RECORD NUMBER Nursing notes reviewed and incorporated, Labs reviewed , Old chart reviewed, Radiograph reviewed , Notes from prior ED visits and [place] Controlled Substance  Database       Patient here with dental pain, right-sided facial swelling.  Differential includes dental abscess, facial cellulitis, other facial abscess.  No sign of Ludwig's angina on exam.  Will treat symptomatically with IV fluids, Toradol.  She reports she drove herself to the emergency department would like to be able to drive home.  Will give IV clindamycin here.  I do not feel she has failed outpatient treatment given she has only had 1 dose of oral clindamycin at home.  She does have a dentist for outpatient follow-up but states she is trying to get into see an oral Careers adviser.  No systemic symptoms currently.  Appears uncomfortable but is nontoxic.  Will obtain labs, CT of the face and reassess after medications.  ED PROGRESS  Patient's labs  show leukocytosis of 12 with left shift.  CT scan of her face shows mild induration of the subcutaneous soft tissues of the right face with no fluid collection or abscess.  No orbital involvement.  Recommended patient continue clindamycin at home.  Will discharge with prescriptions of ibuprofen and Percocet.  She has dental follow-up.  Patient reports feeling better after Toradol here.  She has no sign of trismus, changes in her phonation, hypoxia or respiratory distress, difficulty handling her secretions, Ludwick's angina on exam.  Discussed at length return precautions.  Patient is comfortable with this plan.  At this time, I do not feel there is any life-threatening condition present. I have reviewed, interpreted and discussed all results (EKG, imaging, lab, urine as appropriate) and exam findings with patient/family. I have reviewed nursing notes and appropriate previous records.  I feel the patient is safe to be discharged home without further emergent workup and can continue workup as an outpatient as needed. Discussed usual and customary return precautions. Patient/family verbalize understanding and are comfortable with this plan.  Outpatient follow-up  has been provided as needed. All questions have been answered.  ____________________________________________   FINAL CLINICAL IMPRESSION(S) / ED DIAGNOSES  Final diagnoses:  Swelling of right side of face  Facial cellulitis  Dental caries     ED Discharge Orders         Ordered    ibuprofen (ADVIL) 800 MG tablet  Every 8 hours PRN        11/05/20 0355    oxyCODONE-acetaminophen (PERCOCET) 5-325 MG tablet  Every 6 hours PRN        11/05/20 0355    ondansetron (ZOFRAN ODT) 4 MG disintegrating tablet  Every 6 hours PRN        11/05/20 0355    Bacillus Coagulans-Inulin (PROBIOTIC) 1-250 BILLION-MG CAPS  Daily        11/05/20 0355          *Please note:  Rochel L Casique was evaluated in Emergency Department on 11/05/2020 for the symptoms described in the history of present illness. She was evaluated in the context of the global COVID-19 pandemic, which necessitated consideration that the patient might be at risk for infection with the SARS-CoV-2 virus that causes COVID-19. Institutional protocols and algorithms that pertain to the evaluation of patients at risk for COVID-19 are in a state of rapid change based on information released by regulatory bodies including the CDC and federal and state organizations. These policies and algorithms were followed during the patient's care in the ED.  Some ED evaluations and interventions may be delayed as a result of limited staffing during and the pandemic.*   Note:  This document was prepared using Dragon voice recognition software and may include unintentional dictation errors.   Hydia Copelin, Layla Maw, DO 11/05/20 [phone]

## 2020-11-05 NOTE — Discharge Instructions (Signed)
Please follow-up with your dentist as scheduled.  Please take your antibiotics until complete.  You are being provided a prescription for opiates (also known as narcotics) for pain control.  Opiates can be addictive and should only be used when absolutely necessary for pain control when other alternatives do not work.  We recommend you only use them for the recommended amount of time and only as prescribed.  Please do not take with other sedative medications or alcohol.  Please do not drive, operate machinery, make important decisions while taking opiates.  Please note that these medications can be addictive and have high abuse potential.  Patients can become addicted to narcotics after only taking them for a few days.  Please keep these medications locked away from children, teenagers or any family members with history of substance abuse.  Narcotic pain medicine may also make you constipated.  You may use over-the-counter medications such as MiraLAX, Colace to prevent constipation.  If you become constipated you may use over-the-counter enemas as needed.  Itching and nausea are common side effects of narcotic pain medication.  If you develop uncontrolled vomiting or a rash, please stop these medications.

## 2020-12-17 ENCOUNTER — Other Ambulatory Visit: Payer: Self-pay | Admitting: Nurse Practitioner

## 2020-12-22 ENCOUNTER — Other Ambulatory Visit: Payer: Self-pay | Admitting: Nurse Practitioner

## 2020-12-22 DIAGNOSIS — N63 Unspecified lump in unspecified breast: Secondary | ICD-10-CM

## 2020-12-31 ENCOUNTER — Other Ambulatory Visit: Payer: BC Managed Care – PPO

## 2020-12-31 ENCOUNTER — Inpatient Hospital Stay: Admission: RE | Admit: 2020-12-31 | Payer: BC Managed Care – PPO | Source: Ambulatory Visit

## 2021-01-17 ENCOUNTER — Encounter: Payer: Self-pay | Admitting: *Deleted

## 2021-01-17 ENCOUNTER — Other Ambulatory Visit: Payer: Self-pay

## 2021-01-17 ENCOUNTER — Emergency Department: Payer: BC Managed Care – PPO

## 2021-01-17 DIAGNOSIS — F17290 Nicotine dependence, other tobacco product, uncomplicated: Secondary | ICD-10-CM | POA: Insufficient documentation

## 2021-01-17 DIAGNOSIS — Z5321 Procedure and treatment not carried out due to patient leaving prior to being seen by health care provider: Secondary | ICD-10-CM | POA: Insufficient documentation

## 2021-01-17 DIAGNOSIS — R059 Cough, unspecified: Secondary | ICD-10-CM | POA: Insufficient documentation

## 2021-01-17 NOTE — ED Triage Notes (Signed)
Pt reports a cough today   no fever.  Cig smoker.  Sx began today  states negative covid test today.  Pt alert  speech clear.

## 2021-01-18 ENCOUNTER — Emergency Department
Admission: EM | Admit: 2021-01-18 | Discharge: 2021-01-18 | Disposition: A | Discharge status: Home / Self Care | Payer: BC Managed Care – PPO | Attending: Emergency Medicine | Admitting: Emergency Medicine

## 2021-01-18 NOTE — ED Notes (Signed)
No answer when called from lobby; no answer when phone # listed in chart called 

## 2021-01-18 NOTE — ED Notes (Signed)
No answer when called from lobby 

## 2021-02-10 ENCOUNTER — Ambulatory Visit
Admission: RE | Admit: 2021-02-10 | Discharge: 2021-02-10 | Disposition: A | Discharge status: Home / Self Care | Payer: BC Managed Care – PPO | Source: Ambulatory Visit | Attending: Nurse Practitioner | Admitting: Nurse Practitioner

## 2021-02-10 ENCOUNTER — Other Ambulatory Visit: Payer: Self-pay

## 2021-02-10 DIAGNOSIS — N63 Unspecified lump in unspecified breast: Secondary | ICD-10-CM

## 2021-04-11 ENCOUNTER — Emergency Department: Payer: BC Managed Care – PPO

## 2021-04-11 ENCOUNTER — Other Ambulatory Visit: Payer: Self-pay

## 2021-04-11 DIAGNOSIS — R109 Unspecified abdominal pain: Secondary | ICD-10-CM | POA: Insufficient documentation

## 2021-04-11 DIAGNOSIS — R079 Chest pain, unspecified: Secondary | ICD-10-CM | POA: Diagnosis not present

## 2021-04-11 DIAGNOSIS — Z5321 Procedure and treatment not carried out due to patient leaving prior to being seen by health care provider: Secondary | ICD-10-CM | POA: Diagnosis not present

## 2021-04-11 LAB — COMPREHENSIVE METABOLIC PANEL
ALT: 11 U/L (ref 0–44)
AST: 16 U/L (ref 15–41)
Albumin: 3.6 g/dL (ref 3.5–5.0)
Alkaline Phosphatase: 53 U/L (ref 38–126)
Anion gap: 4 — ABNORMAL LOW (ref 5–15)
BUN: 8 mg/dL (ref 6–20)
CO2: 25 mmol/L (ref 22–32)
Calcium: 9 mg/dL (ref 8.9–10.3)
Chloride: 107 mmol/L (ref 98–111)
Creatinine, Ser: 0.91 mg/dL (ref 0.44–1.00)
GFR, Estimated: >60 mL/min (ref >60)
Glucose, Bld: 141 mg/dL — ABNORMAL HIGH (ref 70–99)
Potassium: 3.6 mmol/L (ref 3.5–5.1)
Sodium: 136 mmol/L (ref 135–145)
Total Bilirubin: 0.4 mg/dL (ref 0.3–1.2)
Total Protein: 7.1 g/dL (ref 6.5–8.1)

## 2021-04-11 LAB — URINALYSIS, COMPLETE (UACMP) WITH MICROSCOPIC
Bilirubin Urine: NEGATIVE
Glucose, UA: NEGATIVE mg/dL
Hgb urine dipstick: NEGATIVE
Ketones, ur: NEGATIVE mg/dL
Leukocytes,Ua: NEGATIVE
Nitrite: NEGATIVE
Protein, ur: NEGATIVE mg/dL
Specific Gravity, Urine: 1.004 — ABNORMAL LOW (ref 1.005–1.030)
pH: 6 (ref 5.0–8.0)

## 2021-04-11 LAB — TROPONIN I (HIGH SENSITIVITY)
Troponin I (High Sensitivity): <2 ng/L (ref <18)
Troponin I (High Sensitivity): 3 ng/L (ref <18)

## 2021-04-11 LAB — CBC
HCT: 36.3 % (ref 36.0–46.0)
Hemoglobin: 12.2 g/dL (ref 12.0–15.0)
MCH: 26.9 pg (ref 26.0–34.0)
MCHC: 33.6 g/dL (ref 30.0–36.0)
MCV: 80.1 fL (ref 80.0–100.0)
Platelets: 234 10*3/uL (ref 150–400)
RBC: 4.53 MIL/uL (ref 3.87–5.11)
RDW: 16.6 % — ABNORMAL HIGH (ref 11.5–15.5)
WBC: 9.7 10*3/uL (ref 4.0–10.5)
nRBC: 0 % (ref 0.0–0.2)

## 2021-04-11 NOTE — ED Triage Notes (Signed)
Pt in with co left flank pain x 1 week. Pt also co left sided chest pain x 4 hrs. Pt states hx of MI in 2019 with stent placement.

## 2021-04-12 ENCOUNTER — Other Ambulatory Visit: Payer: Self-pay | Admitting: Cardiology

## 2021-04-12 ENCOUNTER — Emergency Department
Admission: EM | Admit: 2021-04-12 | Discharge: 2021-04-12 | Disposition: A | Discharge status: Home / Self Care | Payer: BC Managed Care – PPO | Attending: Emergency Medicine | Admitting: Emergency Medicine

## 2021-04-12 NOTE — Progress Notes (Signed)
Opened in error

## 2021-04-12 NOTE — ED Notes (Signed)
No answer when called several times from lobby 

## 2021-04-13 ENCOUNTER — Ambulatory Visit: Payer: Self-pay | Admitting: Cardiovascular Disease

## 2021-04-13 ENCOUNTER — Other Ambulatory Visit: Payer: Self-pay | Admitting: Cardiology

## 2021-04-13 MED ORDER — SODIUM CHLORIDE 0.9% FLUSH: 3.0000 mL | Freq: Two times a day (BID) | INTRAVENOUS | Status: DC

## 2021-04-13 NOTE — Progress Notes (Unsigned)
Opened in error

## 2021-04-14 ENCOUNTER — Other Ambulatory Visit: Payer: Self-pay

## 2021-04-14 ENCOUNTER — Encounter: Payer: Self-pay | Admitting: Cardiovascular Disease

## 2021-04-14 ENCOUNTER — Encounter
Admission: RE | Disposition: A | Discharge status: Home / Self Care | Payer: Self-pay | Source: Home / Self Care | Attending: Internal Medicine

## 2021-04-14 ENCOUNTER — Observation Stay
Admission: RE | Admit: 2021-04-14 | Discharge: 2021-04-15 | Disposition: A | Discharge status: Home / Self Care | Payer: BC Managed Care – PPO | Attending: Internal Medicine | Admitting: Internal Medicine

## 2021-04-14 DIAGNOSIS — K219 Gastro-esophageal reflux disease without esophagitis: Secondary | ICD-10-CM | POA: Diagnosis present

## 2021-04-14 DIAGNOSIS — Z955 Presence of coronary angioplasty implant and graft: Secondary | ICD-10-CM | POA: Diagnosis not present

## 2021-04-14 DIAGNOSIS — I10 Essential (primary) hypertension: Secondary | ICD-10-CM | POA: Diagnosis present

## 2021-04-14 DIAGNOSIS — I25110 Atherosclerotic heart disease of native coronary artery with unstable angina pectoris: Secondary | ICD-10-CM | POA: Diagnosis not present

## 2021-04-14 DIAGNOSIS — I2510 Atherosclerotic heart disease of native coronary artery without angina pectoris: Secondary | ICD-10-CM | POA: Diagnosis present

## 2021-04-14 DIAGNOSIS — Z20822 Contact with and (suspected) exposure to covid-19: Secondary | ICD-10-CM | POA: Diagnosis not present

## 2021-04-14 DIAGNOSIS — F17200 Nicotine dependence, unspecified, uncomplicated: Secondary | ICD-10-CM | POA: Diagnosis not present

## 2021-04-14 DIAGNOSIS — Z79899 Other long term (current) drug therapy: Secondary | ICD-10-CM | POA: Insufficient documentation

## 2021-04-14 DIAGNOSIS — Z7982 Long term (current) use of aspirin: Secondary | ICD-10-CM | POA: Diagnosis not present

## 2021-04-14 DIAGNOSIS — I200 Unstable angina: Secondary | ICD-10-CM | POA: Diagnosis present

## 2021-04-14 DIAGNOSIS — R0789 Other chest pain: Secondary | ICD-10-CM | POA: Diagnosis present

## 2021-04-14 DIAGNOSIS — E785 Hyperlipidemia, unspecified: Secondary | ICD-10-CM | POA: Diagnosis present

## 2021-04-14 DIAGNOSIS — Z720 Tobacco use: Secondary | ICD-10-CM

## 2021-04-14 HISTORY — PX: CORONARY STENT INTERVENTION: CATH118234

## 2021-04-14 HISTORY — PX: LEFT HEART CATH AND CORONARY ANGIOGRAPHY: CATH118249

## 2021-04-14 LAB — CBC
HCT: 35.7 % — ABNORMAL LOW (ref 36.0–46.0)
Hemoglobin: 12.2 g/dL (ref 12.0–15.0)
MCH: 27.5 pg (ref 26.0–34.0)
MCHC: 34.2 g/dL (ref 30.0–36.0)
MCV: 80.6 fL (ref 80.0–100.0)
Platelets: 223 K/uL (ref 150–400)
RBC: 4.43 MIL/uL (ref 3.87–5.11)
RDW: 16.5 % — ABNORMAL HIGH (ref 11.5–15.5)
WBC: 8.8 K/uL (ref 4.0–10.5)
nRBC: 0 % (ref 0.0–0.2)

## 2021-04-14 LAB — HIV ANTIBODY (ROUTINE TESTING W REFLEX): HIV Screen 4th Generation wRfx: NONREACTIVE

## 2021-04-14 LAB — BASIC METABOLIC PANEL WITH GFR
Anion gap: 8 (ref 5–15)
BUN: 6 mg/dL (ref 6–20)
CO2: 23 mmol/L (ref 22–32)
Calcium: 8.6 mg/dL — ABNORMAL LOW (ref 8.9–10.3)
Chloride: 103 mmol/L (ref 98–111)
Creatinine, Ser: 0.84 mg/dL (ref 0.44–1.00)
GFR, Estimated: >60 mL/min
Glucose, Bld: 96 mg/dL (ref 70–99)
Potassium: 3.3 mmol/L — ABNORMAL LOW (ref 3.5–5.1)
Sodium: 134 mmol/L — ABNORMAL LOW (ref 135–145)

## 2021-04-14 LAB — TROPONIN I (HIGH SENSITIVITY)
Troponin I (High Sensitivity): 7 ng/L (ref <18)
Troponin I (High Sensitivity): 16 ng/L (ref <18)

## 2021-04-14 LAB — RESP PANEL BY RT-PCR (FLU A&B, COVID) ARPGX2
Influenza A by PCR: NEGATIVE
Influenza B by PCR: NEGATIVE
SARS Coronavirus 2 by RT PCR: NEGATIVE

## 2021-04-14 LAB — POCT ACTIVATED CLOTTING TIME: Activated Clotting Time: 335 seconds

## 2021-04-14 SURGERY — LEFT HEART CATH AND CORONARY ANGIOGRAPHY
Anesthesia: Moderate Sedation

## 2021-04-14 MED ORDER — ASPIRIN 81 MG PO CHEW
CHEWABLE_TABLET | ORAL | Status: AC
  Filled 2021-04-14: qty 1

## 2021-04-14 MED ORDER — BIVALIRUDIN BOLUS VIA INFUSION - CUPID
INTRAVENOUS | Status: DC | PRN
  Administered 2021-04-14: 85.425 mg via INTRAVENOUS

## 2021-04-14 MED ORDER — MIDAZOLAM HCL 2 MG/2ML IJ SOLN
INTRAMUSCULAR | Status: AC
  Filled 2021-04-14: qty 2

## 2021-04-14 MED ORDER — SODIUM CHLORIDE 0.9% FLUSH: 3.0000 mL | INTRAVENOUS | Status: DC | PRN

## 2021-04-14 MED ORDER — NICOTINE 21 MG/24HR TD PT24
21.0000 mg | MEDICATED_PATCH | TRANSDERMAL | Status: DC
  Administered 2021-04-14: 21 mg via TRANSDERMAL
  Filled 2021-04-14: qty 1

## 2021-04-14 MED ORDER — SODIUM CHLORIDE 0.9 % WEIGHT BASED INFUSION
1.0000 mL/kg/h | INTRAVENOUS | Status: AC
  Administered 2021-04-14: 1 mL/kg/h via INTRAVENOUS

## 2021-04-14 MED ORDER — IOHEXOL 350 MG/ML SOLN
INTRAVENOUS | Status: DC | PRN
  Administered 2021-04-14: 60 mL

## 2021-04-14 MED ORDER — ONDANSETRON HCL 4 MG/2ML IJ SOLN
4.0000 mg | Freq: Four times a day (QID) | INTRAMUSCULAR | Status: DC | PRN
  Administered 2021-04-15: 4 mg via INTRAVENOUS

## 2021-04-14 MED ORDER — CARVEDILOL 25 MG PO TABS
25.0000 mg | ORAL_TABLET | Freq: Two times a day (BID) | ORAL | Status: DC
  Administered 2021-04-14 – 2021-04-15 (×2): 25 mg via ORAL
  Filled 2021-04-14 (×2): qty 1

## 2021-04-14 MED ORDER — FENTANYL CITRATE (PF) 100 MCG/2ML IJ SOLN
INTRAMUSCULAR | Status: DC | PRN
  Administered 2021-04-14: 50 ug via INTRAVENOUS

## 2021-04-14 MED ORDER — ACETAMINOPHEN 325 MG PO TABS
650.0000 mg | ORAL_TABLET | ORAL | Status: DC | PRN
  Administered 2021-04-14: 650 mg via ORAL
  Filled 2021-04-14: qty 2

## 2021-04-14 MED ORDER — LABETALOL HCL 5 MG/ML IV SOLN: 10.0000 mg | INTRAVENOUS | Status: AC | PRN

## 2021-04-14 MED ORDER — ACETAMINOPHEN 325 MG PO TABS: 650.0000 mg | ORAL_TABLET | ORAL | Status: DC | PRN

## 2021-04-14 MED ORDER — ONDANSETRON HCL 4 MG/2ML IJ SOLN: 4.0000 mg | Freq: Four times a day (QID) | INTRAMUSCULAR | Status: DC | PRN

## 2021-04-14 MED ORDER — ONDANSETRON HCL 4 MG/2ML IJ SOLN: 4.0000 mg | Freq: Three times a day (TID) | INTRAMUSCULAR | Status: DC | PRN

## 2021-04-14 MED ORDER — FENTANYL CITRATE (PF) 100 MCG/2ML IJ SOLN
INTRAMUSCULAR | Status: DC | PRN
  Administered 2021-04-14: 25 ug via INTRAVENOUS

## 2021-04-14 MED ORDER — VITAMIN D 25 MCG (1000 UNIT) PO TABS
5000.0000 [IU] | ORAL_TABLET | Freq: Every day | ORAL | Status: DC
  Administered 2021-04-14 – 2021-04-15 (×2): 5000 [IU] via ORAL
  Filled 2021-04-14 (×2): qty 5

## 2021-04-14 MED ORDER — SODIUM CHLORIDE 0.9 % IV SOLN
INTRAVENOUS | Status: AC | PRN
  Administered 2021-04-14: 1.75 mg/kg/h via INTRAVENOUS

## 2021-04-14 MED ORDER — IOHEXOL 350 MG/ML SOLN
INTRAVENOUS | Status: DC | PRN
  Administered 2021-04-14: 95 mL

## 2021-04-14 MED ORDER — SODIUM CHLORIDE 0.9 % IV SOLN: 250.0000 mL | INTRAVENOUS | Status: DC | PRN

## 2021-04-14 MED ORDER — LABETALOL HCL 5 MG/ML IV SOLN: 10.0000 mg | INTRAVENOUS | Status: DC | PRN

## 2021-04-14 MED ORDER — SODIUM CHLORIDE 0.9 % WEIGHT BASED INFUSION
3.0000 mL/kg/h | INTRAVENOUS | Status: DC
  Administered 2021-04-14: 3 mL/kg/h via INTRAVENOUS

## 2021-04-14 MED ORDER — HEPARIN (PORCINE) IN NACL 1000-0.9 UT/500ML-% IV SOLN
INTRAVENOUS | Status: AC
  Filled 2021-04-14: qty 1000

## 2021-04-14 MED ORDER — ASPIRIN 81 MG PO CHEW
81.0000 mg | CHEWABLE_TABLET | Freq: Every day | ORAL | Status: DC
  Administered 2021-04-15: 81 mg via ORAL
  Filled 2021-04-14: qty 1

## 2021-04-14 MED ORDER — BIVALIRUDIN BOLUS VIA INFUSION - CUPID: INTRAVENOUS | Status: DC | PRN

## 2021-04-14 MED ORDER — HYDRALAZINE HCL 20 MG/ML IJ SOLN: 10.0000 mg | INTRAMUSCULAR | Status: DC | PRN

## 2021-04-14 MED ORDER — ENALAPRIL MALEATE 20 MG PO TABS
20.0000 mg | ORAL_TABLET | Freq: Every day | ORAL | Status: DC
  Administered 2021-04-14 – 2021-04-15 (×2): 20 mg via ORAL
  Filled 2021-04-14 (×4): qty 1

## 2021-04-14 MED ORDER — MORPHINE SULFATE (PF) 4 MG/ML IV SOLN
2.0000 mg | INTRAVENOUS | Status: DC | PRN
  Administered 2021-04-14 – 2021-04-15 (×3): 2 mg via INTRAVENOUS
  Filled 2021-04-14 (×3): qty 1

## 2021-04-14 MED ORDER — ASPIRIN 81 MG PO CHEW
81.0000 mg | CHEWABLE_TABLET | ORAL | Status: AC
  Administered 2021-04-14: 81 mg via ORAL

## 2021-04-14 MED ORDER — POTASSIUM CHLORIDE CRYS ER 20 MEQ PO TBCR
40.0000 meq | EXTENDED_RELEASE_TABLET | Freq: Once | ORAL | Status: AC
  Administered 2021-04-14: 40 meq via ORAL
  Filled 2021-04-14: qty 2

## 2021-04-14 MED ORDER — LIDOCAINE HCL 1 % IJ SOLN
INTRAMUSCULAR | Status: AC
  Filled 2021-04-14: qty 20

## 2021-04-14 MED ORDER — LIDOCAINE HCL (PF) 1 % IJ SOLN
INTRAMUSCULAR | Status: DC | PRN
  Administered 2021-04-14: 5 mL

## 2021-04-14 MED ORDER — PRAVASTATIN SODIUM 40 MG PO TABS
40.0000 mg | ORAL_TABLET | Freq: Every day | ORAL | Status: DC
  Administered 2021-04-14: 40 mg via ORAL
  Filled 2021-04-14: qty 1

## 2021-04-14 MED ORDER — BIVALIRUDIN TRIFLUOROACETATE 250 MG IV SOLR
INTRAVENOUS | Status: AC
  Filled 2021-04-14: qty 250

## 2021-04-14 MED ORDER — RANOLAZINE ER 500 MG PO TB12
1000.0000 mg | ORAL_TABLET | Freq: Two times a day (BID) | ORAL | Status: DC
  Administered 2021-04-14 – 2021-04-15 (×2): 1000 mg via ORAL
  Filled 2021-04-14 (×4): qty 2

## 2021-04-14 MED ORDER — TICAGRELOR 90 MG PO TABS
ORAL_TABLET | ORAL | Status: AC
  Filled 2021-04-14: qty 1

## 2021-04-14 MED ORDER — TICAGRELOR 90 MG PO TABS
90.0000 mg | ORAL_TABLET | Freq: Two times a day (BID) | ORAL | Status: DC
  Administered 2021-04-14 – 2021-04-15 (×2): 90 mg via ORAL
  Filled 2021-04-14 (×2): qty 1

## 2021-04-14 MED ORDER — MIDAZOLAM HCL 2 MG/2ML IJ SOLN
INTRAMUSCULAR | Status: DC | PRN
  Administered 2021-04-14: 1 mg via INTRAVENOUS

## 2021-04-14 MED ORDER — RISAQUAD PO CAPS
1.0000 | ORAL_CAPSULE | Freq: Every day | ORAL | Status: DC
  Administered 2021-04-14 – 2021-04-15 (×2): 1 via ORAL
  Filled 2021-04-14 (×2): qty 1

## 2021-04-14 MED ORDER — SODIUM CHLORIDE 0.9% FLUSH
3.0000 mL | Freq: Two times a day (BID) | INTRAVENOUS | Status: DC
  Administered 2021-04-14 – 2021-04-15 (×2): 3 mL via INTRAVENOUS

## 2021-04-14 MED ORDER — PANTOPRAZOLE SODIUM 40 MG PO TBEC
40.0000 mg | DELAYED_RELEASE_TABLET | Freq: Two times a day (BID) | ORAL | Status: DC
  Administered 2021-04-14 – 2021-04-15 (×2): 40 mg via ORAL
  Filled 2021-04-14 (×3): qty 1

## 2021-04-14 MED ORDER — SPIRONOLACTONE 25 MG PO TABS
25.0000 mg | ORAL_TABLET | Freq: Every day | ORAL | Status: DC
  Administered 2021-04-14 – 2021-04-15 (×2): 25 mg via ORAL
  Filled 2021-04-14 (×2): qty 1

## 2021-04-14 MED ORDER — SODIUM CHLORIDE 0.9 % WEIGHT BASED INFUSION: 1.0000 mL/kg/h | INTRAVENOUS | Status: DC

## 2021-04-14 MED ORDER — HEPARIN (PORCINE) IN NACL 1000-0.9 UT/500ML-% IV SOLN
INTRAVENOUS | Status: DC | PRN
  Administered 2021-04-14: 1000 mL

## 2021-04-14 MED ORDER — SODIUM CHLORIDE 0.9% FLUSH
3.0000 mL | INTRAVENOUS | Status: DC | PRN
  Administered 2021-04-15: 3 mL via INTRAVENOUS

## 2021-04-14 MED ORDER — SODIUM CHLORIDE 0.9% FLUSH: 3.0000 mL | Freq: Two times a day (BID) | INTRAVENOUS | Status: DC

## 2021-04-14 MED ORDER — NITROGLYCERIN 0.4 MG SL SUBL: 0.4000 mg | SUBLINGUAL_TABLET | SUBLINGUAL | Status: DC | PRN

## 2021-04-14 MED ORDER — ACETAMINOPHEN 325 MG PO TABS: 650.0000 mg | ORAL_TABLET | Freq: Four times a day (QID) | ORAL | Status: DC | PRN

## 2021-04-14 MED ORDER — HYDRALAZINE HCL 20 MG/ML IJ SOLN: 5.0000 mg | INTRAMUSCULAR | Status: DC | PRN

## 2021-04-14 MED ORDER — HYDRALAZINE HCL 20 MG/ML IJ SOLN: 10.0000 mg | INTRAMUSCULAR | Status: AC | PRN

## 2021-04-14 MED ORDER — FENTANYL CITRATE (PF) 100 MCG/2ML IJ SOLN
INTRAMUSCULAR | Status: AC
  Filled 2021-04-14: qty 2

## 2021-04-14 MED ORDER — TICAGRELOR 90 MG PO TABS
ORAL_TABLET | ORAL | Status: DC | PRN
  Administered 2021-04-14: 90 mg via ORAL

## 2021-04-14 SURGICAL SUPPLY — 22 items
BALLN ~~LOC~~ TREK RX 2.5X8 (BALLOONS) ×2
BALLOON ~~LOC~~ TREK RX 2.5X8 (BALLOONS) ×1 IMPLANT
CATH INFINITI 5FR ANG PIGTAIL (CATHETERS) ×2 IMPLANT
CATH INFINITI 5FR JL4 (CATHETERS) ×2 IMPLANT
CATH INFINITI JR4 5F (CATHETERS) ×2 IMPLANT
CATH VISTA GUIDE 6FR XB3.5 (CATHETERS) ×2 IMPLANT
DEVICE CLOSURE MYNXGRIP 6/7F (Vascular Products) ×2 IMPLANT
DRAPE BRACHIAL (DRAPES) ×2 IMPLANT
KIT ENCORE 26 ADVANTAGE (KITS) ×2 IMPLANT
KIT SYRINGE INJ CVI SPIKEX1 (MISCELLANEOUS) ×2 IMPLANT
MARKER SKIN DUAL TIP RULER LAB (MISCELLANEOUS) ×2 IMPLANT
NEEDLE PERC 18GX7CM (NEEDLE) ×2 IMPLANT
PACK CARDIAC CATH (CUSTOM PROCEDURE TRAY) ×2 IMPLANT
PROTECTION STATION PRESSURIZED (MISCELLANEOUS) ×2
SET ATX SIMPLICITY (MISCELLANEOUS) ×2 IMPLANT
SHEATH AVANTI 5FR X 11CM (SHEATH) ×2 IMPLANT
SHEATH AVANTI 6FR X 11CM (SHEATH) ×2 IMPLANT
STATION PROTECTION PRESSURIZED (MISCELLANEOUS) ×1 IMPLANT
STENT ONYX FRONTIER 2.25X12 (Permanent Stent) ×2 IMPLANT
TUBING CIL FLEX 10 FLL-RA (TUBING) ×2 IMPLANT
WIRE ASAHI PROWATER 180CM (WIRE) ×2 IMPLANT
WIRE GUIDERIGHT .035X150 (WIRE) ×2 IMPLANT

## 2021-04-14 NOTE — H&P (Signed)
History and Physical    Sonya [name] HER:[id number] DOB: [birth year] DOA: 04/14/2021  Referring MD/NP/PA:   PCP: Fayrene Helper, NP   Patient coming from:  The patient is coming from home.  At baseline, pt is independent for most of ADL.        Chief Complaint: chest pain  HPI: Sonya [name] is a 40 y.o. female with medical history significant of hypertension, hyperlipidemia, GERD, tobacco abuse, CAD with stent placement 2019, who presents with chest pain.  Patient has been having intermittent chest pain for more than 1 week.  Per Dr. Welton Flakes, pt had positive stress test as outpatient and is scheduled for cardiac cath today.  She states that her chest pain is located in substernal area, intermittent, mild, pressure-like, nonradiating.  She had mild chest pain earlier today which has resolved currently.  Patient does not have shortness breath, cough, fever or chills.  No nausea vomiting, diarrhea or abdominal pain.  No symptoms of UTI. Patient underwent cardiac catheterization today. Per nurse report, pt had hematoma in the right femoral artery access during the procedure, the hematoma size has significantly reduced after the procedure.  Cardiac cath by Dr. Welton Flakes and Dr. Darrold Junker   Prox LAD to Mid LAD lesion is 30% stenosed.   Mid Cx lesion is 75% stenosed.   Dist RCA lesion is 55% stenosed.   A drug-eluting stent was successfully placed using a STENT ONYX FRONTIER 2.25X12.   Post intervention, there is a 0% residual stenosis.   1.  One-vessel coronary artery disease with high-grade stenosis mid left circumflex 2.  Successful direct stenting with 2.25 x 12 mm Onyx Frontier DES mid left circumflex   Vital sign: Blood pressure 135/95, heart rate 75, RR 23, oxygen saturation 99% on room air, temperature normal. Pending COVID PCR, CBC, BMP.  Patient is placed on progressive bed for observation  Review of Systems:   General: no fevers, chills, no body weight gain, fatigue HEENT: no  blurry vision, hearing changes or sore throat Respiratory: no dyspnea, coughing, wheezing CV: has chest pain, no palpitations GI: no nausea, vomiting, abdominal pain, diarrhea, constipation GU: no dysuria, burning on urination, increased urinary frequency, hematuria  Ext: no leg edema Neuro: no unilateral weakness, numbness, or tingling, no vision change or hearing loss Skin: no rash, no skin tear. MSK: No muscle spasm, no deformity, no limitation of range of movement in spin Heme: No easy bruising.  Travel history: No recent long distant travel.  Allergy:  Allergies  Allergen Reactions   Penicillins Hives    Past Medical History:  Diagnosis Date   Coronary artery disease    GERD (gastroesophageal reflux disease)    Tobacco abuse     Past Surgical History:  Procedure Laterality Date   CORONARY STENT INTERVENTION N/A 06/10/2018   Procedure: CORONARY STENT INTERVENTION;  Surgeon: Alwyn Pea, MD;  Location: [facility] INVASIVE CV LAB;  Service: Cardiovascular;  Laterality: N/A;   LEFT HEART CATH AND CORONARY ANGIOGRAPHY N/A 06/10/2018   Procedure: With coronary intervention and stenting;  Surgeon: Laurier Nancy, MD;  Location: [facility] INVASIVE CV LAB;  Service: Cardiovascular;  Laterality: N/A;   LEFT HEART CATH AND CORONARY ANGIOGRAPHY Left 06/15/2020   Procedure: LEFT HEART CATH AND CORONARY ANGIOGRAPHY;  Surgeon: Laurier Nancy, MD;  Location: [facility] INVASIVE CV LAB;  Service: Cardiovascular;  Laterality: Left;   TUBAL LIGATION      Social History:  reports that she has been smoking. She has  been smoking an average of .5 packs per day. She has never used smokeless tobacco. She reports current alcohol use. She reports that she does not use drugs.  Family History:  Family History  Problem Relation Age of Onset   Heart attack Mother    Heart attack Father      Prior to Admission medications   Medication Sig Start Date End Date Taking? Authorizing Provider  aspirin (ASPIRIN  CHILDRENS) 81 MG chewable tablet Chew 1 tablet (81 mg total) by mouth daily. 06/11/18  Yes Wieting, Richard, MD  Bacillus Coagulans-Inulin (PROBIOTIC) 1-250 BILLION-MG CAPS Take 1 capsule by mouth daily. 11/05/20  Yes Ward, Kristen N, DO  BRILINTA 90 MG TABS tablet Take 90 mg by mouth 2 (two) times daily. 04/12/21  Yes [provider]  carvedilol (COREG) 25 MG tablet Take 25 mg by mouth 2 (two) times daily. 05/26/20  Yes [provider]  Cholecalciferol (VITAMIN D3) 125 MCG (5000 UT) CAPS Take 5,000 Units by mouth daily.   Yes [provider]  enalapril (VASOTEC) 20 MG tablet Take 20 mg by mouth daily. 06/08/20  Yes [provider]  gabapentin (NEURONTIN) 300 MG capsule Take 300 mg by mouth 2 (two) times daily as needed for pain. 01/13/20  Yes [provider]  ibuprofen (ADVIL) 800 MG tablet Take 1 tablet (800 mg total) by mouth every 8 (eight) hours as needed for mild pain. 11/05/20  Yes Ward, Layla Maw, DO  isosorbide mononitrate (IMDUR) 60 MG 24 hr tablet Take 60 mg by mouth in the morning and at bedtime.   Yes [provider]  nystatin cream (MYCOSTATIN) Apply 1 application topically at bedtime as needed (irritation). 04/01/21  Yes [provider]  pantoprazole (PROTONIX) 40 MG tablet Take 40 mg by mouth 2 (two) times daily. 02/24/20  Yes [provider]  pravastatin (PRAVACHOL) 40 MG tablet Take 40 mg by mouth daily. 05/26/20  Yes [provider]  ranolazine (RANEXA) 1000 MG SR tablet Take 1,000 mg by mouth 2 (two) times daily. 06/08/20  Yes [provider]  spironolactone (ALDACTONE) 25 MG tablet Take 25 mg by mouth daily. 05/26/20  Yes [provider]  ondansetron (ZOFRAN ODT) 4 MG disintegrating tablet Take 1 tablet (4 mg total) by mouth every 6 (six) hours as needed for nausea or vomiting. Patient not taking: No sig reported 11/05/20   Ward, Layla Maw, DO  oxyCODONE-acetaminophen (PERCOCET) 5-325 MG  tablet Take 2 tablets by mouth every 6 (six) hours as needed for severe pain. Patient not taking: No sig reported 11/05/20 11/05/21  Ward, Layla Maw, DO    Physical Exam: Vitals:   04/14/21 1030 04/14/21 1045 04/14/21 1100 04/14/21 1130  BP: 125/82  130/86 (!) 144/95  Pulse: 62  65 69  Resp: 18 17 18 17   Temp:      TempSrc:      SpO2: 98%  99% 99%  Height:       General: Not in acute distress HEENT:       Eyes: PERRL, EOMI, no scleral icterus.       ENT: No discharge from the ears and nose, no pharynx injection, no tonsillar enlargement.        Neck: No JVD, no bruit, no mass felt. Heme: No neck lymph node enlargement. Cardiac: S1/S2, RRR, No murmurs, No gallops or rubs. Respiratory: No rales, wheezing, rhonchi or rubs. GI: Soft, nondistended, nontender, no rebound pain, no organomegaly, BS present. GU: No hematuria Ext: No  pitting leg edema bilaterally. 1+DP/PT pulse bilaterally. Musculoskeletal: No joint deformities, No joint redness or warmth, no limitation of ROM in spin. Skin: No rashes.  Neuro: Alert, oriented X3, cranial nerves II-XII grossly intact, moves all extremities normally.  Psych: Patient is not psychotic, no suicidal or hemocidal ideation.  Labs on Admission: I have personally reviewed following labs and imaging studies  CBC: Recent Labs  Lab 04/11/21 2005  WBC 9.7  HGB 12.2  HCT 36.3  MCV 80.1  PLT 234   Basic Metabolic Panel: Recent Labs  Lab 04/11/21 2005  NA 136  K 3.6  CL 107  CO2 25  GLUCOSE 141*  BUN 8  CREATININE 0.91  CALCIUM 9.0   GFR: Estimated Creatinine Clearance: 113 mL/min (by C-G formula based on SCr of 0.91 mg/dL). Liver Function Tests: Recent Labs  Lab 04/11/21 2005  AST 16  ALT 11  ALKPHOS 53  BILITOT 0.4  PROT 7.1  ALBUMIN 3.6   No results for input(s): LIPASE, AMYLASE in the last 168 hours. No results for input(s): AMMONIA in the last 168 hours. Coagulation Profile: No results for input(s): INR, PROTIME in the  last 168 hours. Cardiac Enzymes: No results for input(s): CKTOTAL, CKMB, CKMBINDEX, TROPONINI in the last 168 hours. BNP (last 3 results) No results for input(s): PROBNP in the last 8760 hours. HbA1C: No results for input(s): HGBA1C in the last 72 hours. CBG: No results for input(s): GLUCAP in the last 168 hours. Lipid Profile: No results for input(s): CHOL, HDL, LDLCALC, TRIG, CHOLHDL, LDLDIRECT in the last 72 hours. Thyroid Function Tests: No results for input(s): TSH, T4TOTAL, FREET4, T3FREE, THYROIDAB in the last 72 hours. Anemia Panel: No results for input(s): VITAMINB12, FOLATE, FERRITIN, TIBC, IRON, RETICCTPCT in the last 72 hours. Urine analysis:    Component Value Date/Time   COLORURINE STRAW (A) 04/11/2021 2005   APPEARANCEUR HAZY (A) 04/11/2021 2005   LABSPEC 1.004 (L) 04/11/2021 2005   PHURINE 6.0 04/11/2021 2005   GLUCOSEU NEGATIVE 04/11/2021 2005   HGBUR NEGATIVE 04/11/2021 2005   BILIRUBINUR NEGATIVE 04/11/2021 2005   KETONESUR NEGATIVE 04/11/2021 2005   PROTEINUR NEGATIVE 04/11/2021 2005   NITRITE NEGATIVE 04/11/2021 2005   LEUKOCYTESUR NEGATIVE 04/11/2021 2005   Sepsis Labs: @LABRCNTIP (procalcitonin:4,lacticidven:4) )No results found for this or any previous visit (from the past 240 hour(s)).   Radiological Exams on Admission: CARDIAC CATHETERIZATION  Result Date: 04/14/2021   Prox LAD to Mid LAD lesion is 30% stenosed.   Mid Cx lesion is 75% stenosed.   Dist RCA lesion is 55% stenosed.   A drug-eluting stent was successfully placed using a STENT ONYX FRONTIER 2.25X12.   Post intervention, there is a 0% residual stenosis. 1.  One-vessel coronary artery disease with high-grade stenosis mid left circumflex 2.  Successful direct stenting with 2.25 x 12 mm Onyx Frontier DES mid left circumflex   CARDIAC CATHETERIZATION  Result Date: 04/14/2021   Dist RCA lesion is 55% stenosed.   Mid Cx lesion is 75% stenosed.   Prox LAD to Mid LAD lesion is 30% stenosed.   The  left ventricular systolic function is normal.   LV end diastolic pressure is normal.   The left ventricular ejection fraction is 55-65% by visual estimate.   There is no mitral valve regurgitation. Advise PCI of the mid left circumflex with drug-eluting stent.  She is already     EKG: I have personally reviewed.  Sinus rhythm, QTC 429, T wave inversion in V1-V4, early R wave  progression Assessment/Plan Principal Problem:   Unstable angina (HCC) Active Problems:   GERD (gastroesophageal reflux disease)   Tobacco abuse   Coronary artery disease   HLD (hyperlipidemia)   HTN (hypertension)   Unstable angina and hx of CAD: pt is s/p of cardiac cath today, with successful direct stenting with 2.25 x 12 mm Onyx Frontier DES mid left circumflex.  Currently no chest pain.  - place to progressive unit for observation - Trend Trop - prn Nitroglycerin, Morphine, - Apirin, Brilinta, pravastatin, Ranexa - Risk factor stratification: will check FLP and A1C  - check UDS  GERD (gastroesophageal reflux disease) -Protonix  Tobacco abuse -Nicotine patch  HLD (hyperlipidemia) -Pravastatin  HTN: -IV hydralazine as needed -Continue home Coreg, enalapril, spironolactone        DVT ppx: SCD Code Status: Full code Family Communication: not done, no family member is at bed side.       Disposition Plan:  Anticipate discharge back to previous environment Consults called:  Dr. Welton Flakes Admission status and Level of care: Progressive Cardiac:    Med-surg bed for obs as inpt    progressive unit for obs   as inpt      SDU/inpation          Status is: Observation  The patient remains OBS appropriate and will d/c before 2 midnights.         Date of Service 04/14/2021    Lorretta Harp [facility]   If 7PM-7AM, please contact night-coverage [email or web address] 04/14/2021, 11:56 AM

## 2021-04-14 NOTE — Consult Note (Addendum)
Sonya [name] is a 40 y.o. female  [id number]  Primary Cardiologist: Adrian Blackwater Reason for Consultation: Unstable angina  HPI: This is a 40 year old African-American female with a past medical history of coronary artery disease status post PCI with stenting of a mid LAD presented to the hospital with chest pain and ruled out for myocardial infarction.  Patient was on maximal medical therapy and had a high risk nuclear stress test on 11/03/2020. Stress test was positive for ischemia and continues to smoke thus patient was scheduled for cardiac catheterization.   Review of Systems: No chest pain at this time   Past Medical History:  Diagnosis Date   Coronary artery disease    GERD (gastroesophageal reflux disease)    Tobacco abuse     Medications Prior to Admission  Medication Sig Dispense Refill   aspirin (ASPIRIN CHILDRENS) 81 MG chewable tablet Chew 1 tablet (81 mg total) by mouth daily. 30 tablet 0   Bacillus Coagulans-Inulin (PROBIOTIC) 1-250 BILLION-MG CAPS Take 1 capsule by mouth daily. 30 capsule 0   BRILINTA 90 MG TABS tablet Take 90 mg by mouth 2 (two) times daily.     carvedilol (COREG) 25 MG tablet Take 25 mg by mouth 2 (two) times daily.     Cholecalciferol (VITAMIN D3) 125 MCG (5000 UT) CAPS Take 5,000 Units by mouth daily.     enalapril (VASOTEC) 20 MG tablet Take 20 mg by mouth daily.     gabapentin (NEURONTIN) 300 MG capsule Take 300 mg by mouth 2 (two) times daily as needed for pain.     ibuprofen (ADVIL) 800 MG tablet Take 1 tablet (800 mg total) by mouth every 8 (eight) hours as needed for mild pain. 30 tablet 0   isosorbide mononitrate (IMDUR) 60 MG 24 hr tablet Take 60 mg by mouth in the morning and at bedtime.     nystatin cream (MYCOSTATIN) Apply 1 application topically at bedtime as needed (irritation).     pantoprazole (PROTONIX) 40 MG tablet Take 40 mg by mouth 2 (two) times daily.     pravastatin (PRAVACHOL) 40 MG tablet Take 40 mg by mouth daily.      ranolazine (RANEXA) 1000 MG SR tablet Take 1,000 mg by mouth 2 (two) times daily.     spironolactone (ALDACTONE) 25 MG tablet Take 25 mg by mouth daily.     ondansetron (ZOFRAN ODT) 4 MG disintegrating tablet Take 1 tablet (4 mg total) by mouth every 6 (six) hours as needed for nausea or vomiting. (Patient not taking: No sig reported) 20 tablet 0   oxyCODONE-acetaminophen (PERCOCET) 5-325 MG tablet Take 2 tablets by mouth every 6 (six) hours as needed for severe pain. (Patient not taking: No sig reported) 14 tablet 0      aspirin        Infusions:  sodium chloride     [START ON 04/15/2021] sodium chloride 3 mL/kg/hr (04/14/21 0803)   Followed by   Melene Muller ON 04/15/2021] sodium chloride      Allergies  Allergen Reactions   Penicillins Hives    Social History   Socioeconomic History   Marital status: Legally Separated    Spouse name: Not on file   Number of children: 4   Years of education: Not on file   Highest education level: Not on file  Occupational History   Not on file  Tobacco Use   Smoking status: Every Day    Packs/day: 0.50    Types: Cigarettes  Smokeless tobacco: Never  Vaping Use   Vaping Use: Never used  Substance and Sexual Activity   Alcohol use: Yes    Comment: occasionally   Drug use: Never   Sexual activity: Not on file  Other Topics Concern   Not on file  Social History Narrative   Lives at home with all her children   Social Determinants of Health   Financial Resource Strain: Not on file  Food Insecurity: Not on file  Transportation Needs: Not on file  Physical Activity: Not on file  Stress: Not on file  Social Connections: Not on file  Intimate Partner Violence: Not on file    Family History  Problem Relation Age of Onset   Heart attack Mother    Heart attack Father     PHYSICAL EXAM: Vitals:   04/14/21 0741  BP: (!) 135/95  Pulse: 75  Resp: (!) 23  Temp: 98 F (36.7 C)  SpO2: 99%    No intake or output data in the 24  hours ending 04/14/21 0927  General:  Well appearing. No respiratory difficulty HEENT: normal Neck: supple. no JVD. Carotids 2+ bilat; no bruits. No lymphadenopathy or thryomegaly appreciated. Cor: PMI nondisplaced. Regular rate & rhythm. No rubs, gallops or murmurs. Lungs: clear Abdomen: soft, nontender, nondistended. No hepatosplenomegaly. No bruits or masses. Good bowel sounds. Extremities: no cyanosis, clubbing, rash, edema Neuro: alert & oriented x 3, cranial nerves grossly intact. moves all 4 extremities w/o difficulty. Affect pleasant.  ECG: Normal sinus rhythm nonspecific ST-T changes  No results found for this or any previous visit (from the past 24 hour(s)). CARDIAC CATHETERIZATION  Result Date: 04/14/2021   Dist RCA lesion is 55% stenosed.   Mid Cx lesion is 75% stenosed.   Prox LAD to Mid LAD lesion is 30% stenosed.   The left ventricular systolic function is normal.   LV end diastolic pressure is normal.   The left ventricular ejection fraction is 55-65% by visual estimate.   There is no mitral valve regurgitation. Advise PCI of the mid left circumflex with drug-eluting stent.  She is already     ASSESSMENT AND PLAN: Unstable angina with history of coronary artery disease status post PCI and stenting of the mid LAD in the past presented to the hospital with recurrent chest pain and was seen in the office and scheduled for cardiac catheterization today.  Patient was loaded Tuesday with the Brilinta 180 mg and then on maximum medical therapy already thus cardiac cath was done.  Initial Angioplasty was done on 06/07/2018 for non-STEMI in the mid LAD and now has on cardiac catheterization 80% lesion in the mid to mid left circumflex with stent in the LAD patent.  There is also lesion in the distal RCA prior to the bifurcation of PDA and PLV but is around 55 to 60%.  LVEF is 60%.  Advised PCI with drug-eluting stent.  Patient will be admitted overnight with hospitalist service.   Discharging in the morning with follow-up next week in the office.  Othell Diluzio A

## 2021-04-15 ENCOUNTER — Encounter: Payer: Self-pay | Admitting: Cardiovascular Disease

## 2021-04-15 DIAGNOSIS — I200 Unstable angina: Secondary | ICD-10-CM | POA: Diagnosis not present

## 2021-04-15 DIAGNOSIS — I25110 Atherosclerotic heart disease of native coronary artery with unstable angina pectoris: Secondary | ICD-10-CM | POA: Diagnosis not present

## 2021-04-15 LAB — CBC
HCT: 33.8 % — ABNORMAL LOW (ref 36.0–46.0)
Hemoglobin: 11.4 g/dL — ABNORMAL LOW (ref 12.0–15.0)
MCH: 27.2 pg (ref 26.0–34.0)
MCHC: 33.7 g/dL (ref 30.0–36.0)
MCV: 80.7 fL (ref 80.0–100.0)
Platelets: 213 10*3/uL (ref 150–400)
RBC: 4.19 MIL/uL (ref 3.87–5.11)
RDW: 16.7 % — ABNORMAL HIGH (ref 11.5–15.5)
WBC: 8 10*3/uL (ref 4.0–10.5)
nRBC: 0 % (ref 0.0–0.2)

## 2021-04-15 LAB — BASIC METABOLIC PANEL
Anion gap: 3 — ABNORMAL LOW (ref 5–15)
BUN: 6 mg/dL (ref 6–20)
CO2: 22 mmol/L (ref 22–32)
Calcium: 9.1 mg/dL (ref 8.9–10.3)
Chloride: 110 mmol/L (ref 98–111)
Creatinine, Ser: 0.8 mg/dL (ref 0.44–1.00)
GFR, Estimated: >60 mL/min (ref >60)
Glucose, Bld: 110 mg/dL — ABNORMAL HIGH (ref 70–99)
Potassium: 4.1 mmol/L (ref 3.5–5.1)
Sodium: 135 mmol/L (ref 135–145)

## 2021-04-15 LAB — LIPID PANEL
Cholesterol: 225 mg/dL — ABNORMAL HIGH (ref 0–200)
HDL: 40 mg/dL — ABNORMAL LOW (ref >40)
LDL Cholesterol: 165 mg/dL — ABNORMAL HIGH (ref 0–99)
Total CHOL/HDL Ratio: 5.6 RATIO
Triglycerides: 98 mg/dL (ref <150)
VLDL: 20 mg/dL (ref 0–40)

## 2021-04-15 LAB — MAGNESIUM: Magnesium: 1.7 mg/dL (ref 1.7–2.4)

## 2021-04-15 LAB — HEMOGLOBIN A1C
Hgb A1c MFr Bld: 5.8 % — ABNORMAL HIGH (ref 4.8–5.6)
Mean Plasma Glucose: 120 mg/dL

## 2021-04-15 MED ORDER — CLOPIDOGREL BISULFATE 75 MG PO TABS: 75.0000 mg | ORAL_TABLET | Freq: Every day | ORAL | Status: DC

## 2021-04-15 MED ORDER — NICOTINE 21 MG/24HR TD PT24: 21.0000 mg | MEDICATED_PATCH | TRANSDERMAL | 0 refills | Status: DC

## 2021-04-15 MED ORDER — NITROGLYCERIN 0.4 MG SL SUBL: 0.4000 mg | SUBLINGUAL_TABLET | SUBLINGUAL | 12 refills | Status: AC | PRN

## 2021-04-15 MED ORDER — CLOPIDOGREL BISULFATE 75 MG PO TABS: 75.0000 mg | ORAL_TABLET | Freq: Every day | ORAL | 3 refills | Status: DC

## 2021-04-15 NOTE — Progress Notes (Signed)
SUBJECTIVE: Patient with history of CAD, abnormal stress test. Cardiac cath 04/14/21 DES mid left circumflex, distal RCA 55% stenosed.  Patient doing will this morning. Denies chest pain, feeling better. Patient reports mild discomfort in right femoral artery access area.    Vitals:   04/14/21 2314 04/15/21 0301 04/15/21 0800 04/15/21 0810  BP: 114/73 120/83 116/68 116/68  Pulse: 72 66 63 70  Resp: 18 18 19 20   Temp: 98 F (36.7 C) 98.2 F (36.8 C) 98.1 F (36.7 C) 98.1 F (36.7 C)  TempSrc:   Oral   SpO2: 100% 100% 99% 100%  Weight:      Height:        Intake/Output Summary (Last 24 hours) at 04/15/2021 0849 Last data filed at 04/15/2021 0400 Gross per 24 hour  Intake 1410.13 ml  Output --  Net 1410.13 ml    LABS: Basic Metabolic Panel: Recent Labs    04/14/21 1424 04/15/21 0419  NA 134* 135  K 3.3* 4.1  CL 103 110  CO2 23 22  GLUCOSE 96 110*  BUN 6 6  CREATININE 0.84 0.80  CALCIUM 8.6* 9.1  MG  --  1.7   Liver Function Tests: No results for input(s): AST, ALT, ALKPHOS, BILITOT, PROT, ALBUMIN in the last 72 hours. No results for input(s): LIPASE, AMYLASE in the last 72 hours. CBC: Recent Labs    04/14/21 1424 04/15/21 0419  WBC 8.8 8.0  HGB 12.2 11.4*  HCT 35.7* 33.8*  MCV 80.6 80.7  PLT 223 213   Cardiac Enzymes: No results for input(s): CKTOTAL, CKMB, CKMBINDEX, TROPONINI in the last 72 hours. BNP: Invalid input(s): POCBNP D-Dimer: No results for input(s): DDIMER in the last 72 hours. Hemoglobin A1C: Recent Labs    04/14/21 1424  HGBA1C 5.8*   Fasting Lipid Panel: Recent Labs    04/15/21 0419  CHOL 225*  HDL 40*  LDLCALC 165*  TRIG 98  CHOLHDL 5.6   Thyroid Function Tests: No results for input(s): TSH, T4TOTAL, T3FREE, THYROIDAB in the last 72 hours.  Invalid input(s): FREET3 Anemia Panel: No results for input(s): VITAMINB12, FOLATE, FERRITIN, TIBC, IRON, RETICCTPCT in the last 72 hours.   PHYSICAL EXAM General: Well  developed, well nourished, in no acute distress HEENT:  Normocephalic and atramatic Neck:  No JVD.  Lungs: Clear bilaterally to auscultation and percussion. Heart: HRRR . Normal S1 and S2 without gallops or murmurs.  Abdomen: Bowel sounds are positive, abdomen soft and non-tender  Msk:  Back normal, normal gait. Normal strength and tone for age. Extremities: No clubbing, cyanosis or edema.   Neuro: Alert and oriented X 3. Psych:  Good affect, responds appropriately  TELEMETRY: NSR, HR 68 bpm  ASSESSMENT AND PLAN: Patient doing well. Can be discharged today on Brilinta 90 mg twice daily with follow up in office on Monday 04/18/21 at 10:00 am.   Principal Problem:   Unstable angina (HCC) Active Problems:   GERD (gastroesophageal reflux disease)   Tobacco abuse   Coronary artery disease   HLD (hyperlipidemia)   HTN (hypertension)    Lissy Deuser, FNP-C 04/15/2021 8:49 AM

## 2021-04-15 NOTE — Discharge Summary (Signed)
[facility] [place] at [facility]   PATIENT NAME: Sonya [name]    MR#:  [id number]  DATE OF BIRTH:  May 23, 1981  DATE OF ADMISSION:  04/14/2021 ADMITTING PHYSICIAN: Marcina Millard, MD  DATE OF DISCHARGE: 04/15/2021  PRIMARY CARE PHYSICIAN: Fayrene Helper, NP    ADMISSION DIAGNOSIS:  Unstable angina (HCC) [I20.0] Unstable angina pectoris (HCC) [I20.0]  DISCHARGE DIAGNOSIS:  Unstable angina with CAD s/p cath and DES in mid left circumflex Right groin hematoma--post cath SECONDARY DIAGNOSIS:   Past Medical History:  Diagnosis Date   Coronary artery disease    GERD (gastroesophageal reflux disease)    Tobacco abuse     HOSPITAL COURSE:   Sonya [name] is a 40 y.o. female with medical history significant of hypertension, hyperlipidemia, GERD, tobacco abuse, CAD with stent placement 2019, who presents with chest pain.  Unstable angina and hx of CAD:  --pt is s/p of cardiac cath with successful direct stenting with 2.25 x 12 mm Onyx Frontier DES mid left circumflex.  Currently no chest pain.  - Trend Trop--flat - prn Nitroglycerin, Morphine, - Apirin, Brilinta, pravastatin, Ranexa - Risk factor stratification: will check FLP and A1C  - 10/21--pt wants to go home. She informs me her insurance has not covered Brilinta before. Chat msg to dr Khan--recommends Plavix 75 mg daily. Dr Welton Flakes did not seem to feel loading dose of plavix is needed when asked by pharmacy. Will d/c on plavix 75 mg qd   GERD (gastroesophageal reflux disease) -Protonix   Tobacco abuse -Nicotine patch   HLD (hyperlipidemia) -Pravastatin   HTN: -IV hydralazine as needed -Continue home Coreg, enalapril, spironolactone  Overall stable. Ok from cardiology side for discharge    CONSULTS OBTAINED:    DRUG ALLERGIES:   Allergies  Allergen Reactions   Penicillins Hives    DISCHARGE MEDICATIONS:   Allergies as of 04/15/2021       Reactions   Penicillins Hives         Medication List     STOP taking these medications    Brilinta 90 MG Tabs tablet Generic drug: ticagrelor   ondansetron 4 MG disintegrating tablet Commonly known as: Zofran ODT   oxyCODONE-acetaminophen 5-325 MG tablet Commonly known as: Percocet       TAKE these medications    aspirin 81 MG chewable tablet Commonly known as: Aspirin Childrens Chew 1 tablet (81 mg total) by mouth daily.   carvedilol 25 MG tablet Commonly known as: COREG Take 25 mg by mouth 2 (two) times daily.   clopidogrel 75 MG tablet Commonly known as: PLAVIX Take 1 tablet (75 mg total) by mouth daily. Start taking on: April 16, 2021   enalapril 20 MG tablet Commonly known as: VASOTEC Take 20 mg by mouth daily.   gabapentin 300 MG capsule Commonly known as: NEURONTIN Take 300 mg by mouth 2 (two) times daily as needed for pain.   ibuprofen 800 MG tablet Commonly known as: ADVIL Take 1 tablet (800 mg total) by mouth every 8 (eight) hours as needed for mild pain.   isosorbide mononitrate 60 MG 24 hr tablet Commonly known as: IMDUR Take 60 mg by mouth in the morning and at bedtime.   nicotine 21 mg/24hr patch Commonly known as: NICODERM CQ - dosed in mg/24 hours Place 1 patch (21 mg total) onto the skin daily.   nitroGLYCERIN 0.4 MG SL tablet Commonly known as: NITROSTAT Place 1 tablet (0.4 mg total) under the tongue every 5 (five)  minutes as needed for chest pain.   nystatin cream Commonly known as: MYCOSTATIN Apply 1 application topically at bedtime as needed (irritation).   pantoprazole 40 MG tablet Commonly known as: PROTONIX Take 40 mg by mouth 2 (two) times daily.   pravastatin 40 MG tablet Commonly known as: PRAVACHOL Take 40 mg by mouth daily.   Probiotic 1-250 BILLION-MG Caps Take 1 capsule by mouth daily.   ranolazine 1000 MG SR tablet Commonly known as: RANEXA Take 1,000 mg by mouth 2 (two) times daily.   spironolactone 25 MG tablet Commonly known as:  ALDACTONE Take 25 mg by mouth daily.   Vitamin D3 125 MCG (5000 UT) Caps Take 5,000 Units by mouth daily.        If you experience worsening of your admission symptoms, develop shortness of breath, life threatening emergency, suicidal or homicidal thoughts you must seek medical attention immediately by calling 911 or calling your MD immediately  if symptoms less severe.  You Must read complete instructions/literature along with all the possible adverse reactions/side effects for all the Medicines you take and that have been prescribed to you. Take any new Medicines after you have completely understood and accept all the possible adverse reactions/side effects.   Please note  You were cared for by a hospitalist during your hospital stay. If you have any questions about your discharge medications or the care you received while you were in the hospital after you are discharged, you can call the unit and asked to speak with the hospitalist on call if the hospitalist that took care of you is not available. Once you are discharged, your primary care physician will handle any further medical issues. Please note that NO REFILLS for any discharge medications will be authorized once you are discharged, as it is imperative that you return to your primary care physician (or establish a relationship with a primary care physician if you do not have one) for your aftercare needs so that they can reassess your need for medications and monitor your lab values. Today   SUBJECTIVE   I am ready to go home  VITAL SIGNS:  Blood pressure 116/68, pulse 70, temperature 98.1 F (36.7 C), resp. rate 20, height 5\' 11"  (1.803 m), weight 111.9 kg, last menstrual period 03/28/2021, SpO2 100 %.  I/O:   Intake/Output Summary (Last 24 hours) at 04/15/2021 1054 Last data filed at 04/15/2021 0400 Gross per 24 hour  Intake 1410.13 ml  Output --  Net 1410.13 ml    PHYSICAL EXAMINATION:  GENERAL:  40 y.o.-year-old  patient lying in the bed with no acute distress.  EYES: Pupils equal, round, reactive to light and accommodation. No scleral icterus.  HEENT: Head atraumatic, normocephalic. Oropharynx and nasopharynx clear.  NECK:  Supple, no jugular venous distention. No thyroid enlargement, no tenderness.  LUNGS: Normal breath sounds bilaterally, no wheezing, rales,rhonchi or crepitation. No use of accessory muscles of respiration.  CARDIOVASCULAR: S1, S2 normal. No murmurs, rubs, or gallops.  ABDOMEN: Soft, non-tender, non-distended. Bowel sounds present. No organomegaly or mass.  EXTREMITIES: No pedal edema, cyanosis, or clubbing.  NEUROLOGIC: Cranial nerves II through XII are intact. Muscle strength 5/5 in all extremities. Sensation intact. Gait not checked.  PSYCHIATRIC: The patient is alert and oriented x 3.  SKIN: No obvious rash, lesion, or ulcer.   DATA REVIEW:   CBC  Recent Labs  Lab 04/15/21 0419  WBC 8.0  HGB 11.4*  HCT 33.8*  PLT 213    Chemistries  Recent Labs  Lab 04/11/21 2005 04/14/21 1424 04/15/21 0419  NA 136   < > 135  K 3.6   < > 4.1  CL 107   < > 110  CO2 25   < > 22  GLUCOSE 141*   < > 110*  BUN 8   < > 6  CREATININE 0.91   < > 0.80  CALCIUM 9.0   < > 9.1  MG  --   --  1.7  AST 16  --   --   ALT 11  --   --   ALKPHOS 53  --   --   BILITOT 0.4  --   --    < > = values in this interval not displayed.    Microbiology Results   Recent Results (from the past 240 hour(s))  Resp Panel by RT-PCR (Flu A&B, Covid) Nasopharyngeal Swab     Status: None   Collection Time: 04/14/21  9:00 PM   Specimen: Nasopharyngeal Swab; Nasopharyngeal(NP) swabs in vial transport medium  Result Value Ref Range Status   SARS Coronavirus 2 by RT PCR NEGATIVE NEGATIVE Final    Comment: (NOTE) SARS-CoV-2 target nucleic acids are NOT DETECTED.  The SARS-CoV-2 RNA is generally detectable in upper respiratory specimens during the acute phase of infection. The lowest concentration of  SARS-CoV-2 viral copies this assay can detect is 138 copies/mL. A negative result does not preclude SARS-Cov-2 infection and should not be used as the sole basis for treatment or other patient management decisions. A negative result may occur with  improper specimen collection/handling, submission of specimen other than nasopharyngeal swab, presence of viral mutation(s) within the areas targeted by this assay, and inadequate number of viral copies(<138 copies/mL). A negative result must be combined with clinical observations, patient history, and epidemiological information. The expected result is Negative.  Fact Sheet for Patients:  BloggerCourse.com  Fact Sheet for Healthcare Providers:  SeriousBroker.it  This test is no t yet approved or cleared by the [place] FDA and  has been authorized for detection and/or diagnosis of SARS-CoV-2 by FDA under an Emergency Use Authorization (EUA). This EUA will remain  in effect (meaning this test can be used) for the duration of the COVID-19 declaration under Section 564(b)(1) of the Act, 21 U.S.C.section 360bbb-3(b)(1), unless the authorization is terminated  or revoked sooner.       Influenza A by PCR NEGATIVE NEGATIVE Final   Influenza B by PCR NEGATIVE NEGATIVE Final    Comment: (NOTE) The Xpert Xpress SARS-CoV-2/FLU/RSV plus assay is intended as an aid in the diagnosis of influenza from Nasopharyngeal swab specimens and should not be used as a sole basis for treatment. Nasal washings and aspirates are unacceptable for Xpert Xpress SARS-CoV-2/FLU/RSV testing.  Fact Sheet for Patients: BloggerCourse.com  Fact Sheet for Healthcare Providers: SeriousBroker.it  This test is not yet approved or cleared by the [place] FDA and has been authorized for detection and/or diagnosis of SARS-CoV-2 by FDA under an Emergency Use  Authorization (EUA). This EUA will remain in effect (meaning this test can be used) for the duration of the COVID-19 declaration under Section 564(b)(1) of the Act, 21 U.S.C. section 360bbb-3(b)(1), unless the authorization is terminated or revoked.  Performed at [facility], [address] [place], [place] [address]     RADIOLOGY:  CARDIAC CATHETERIZATION  Result Date: 04/14/2021   Prox LAD to Mid LAD lesion is 30% stenosed.   Mid Cx lesion is 75% stenosed.  Dist RCA lesion is 55% stenosed.   A drug-eluting stent was successfully placed using a STENT ONYX FRONTIER 2.25X12.   Post intervention, there is a 0% residual stenosis. 1.  One-vessel coronary artery disease with high-grade stenosis mid left circumflex 2.  Successful direct stenting with 2.25 x 12 mm Onyx Frontier DES mid left circumflex   CARDIAC CATHETERIZATION  Result Date: 04/14/2021   Dist RCA lesion is 55% stenosed.   Mid Cx lesion is 75% stenosed.   Prox LAD to Mid LAD lesion is 30% stenosed.   The left ventricular systolic function is normal.   LV end diastolic pressure is normal.   The left ventricular ejection fraction is 55-65% by visual estimate.   There is no mitral valve regurgitation. Advise PCI of the mid left circumflex with drug-eluting stent.  She is already     CODE STATUS:     Code Status Orders  (From admission, onward)           Start     Ordered   04/14/21 1059  Full code  Continuous        04/14/21 1058           Code Status History     Date Active Date Inactive Code Status Order ID Comments User Context   04/14/2021 1000 04/14/2021 1058 Full Code [id number]  Lorretta Harp, MD Inpatient   06/15/2020 1311 06/15/2020 1910 Full Code [id number]  Laurier Nancy, MD Inpatient   06/10/2018 1836 06/11/2018 1319 Full Code [id number]  Alwyn Pea, MD Inpatient   06/08/2018 0038 06/10/2018 1836 Full Code [id number]  Oralia Manis, MD Inpatient        TOTAL TIME TAKING CARE OF  THIS PATIENT: 40 minutes.    Enedina Finner M.D  [facility]    CC: Primary care physician; Fayrene Helper, NP

## 2021-04-15 NOTE — Discharge Instructions (Signed)
Advise smoking cessation 

## 2021-04-15 NOTE — Progress Notes (Signed)
Patient was given discharge instructions and education. IV assessments performed and IV removed from both hands.   Patient is alert and oriented and all vitals are normal.  Patient scheduled for follow up appointment with Cardiologist on 04/18/2021

## 2021-06-30 ENCOUNTER — Other Ambulatory Visit: Payer: Self-pay | Admitting: Nurse Practitioner

## 2021-06-30 DIAGNOSIS — R1013 Epigastric pain: Secondary | ICD-10-CM

## 2021-06-30 DIAGNOSIS — R10814 Left lower quadrant abdominal tenderness: Secondary | ICD-10-CM

## 2021-07-21 ENCOUNTER — Ambulatory Visit
Admission: RE | Admit: 2021-07-21 | Discharge: 2021-07-21 | Disposition: A | Discharge status: Home / Self Care | Payer: BC Managed Care – PPO | Source: Ambulatory Visit | Attending: Nurse Practitioner | Admitting: Nurse Practitioner

## 2021-07-21 DIAGNOSIS — R1013 Epigastric pain: Secondary | ICD-10-CM

## 2021-07-21 DIAGNOSIS — R10814 Left lower quadrant abdominal tenderness: Secondary | ICD-10-CM

## 2021-07-21 LAB — POCT I-STAT CREATININE: Creatinine, Ser: 1.1 mg/dL — ABNORMAL HIGH (ref 0.44–1.00)

## 2021-07-21 MED ORDER — IOHEXOL 300 MG/ML  SOLN
100.0000 mL | Freq: Once | INTRAMUSCULAR | Status: AC | PRN
  Administered 2021-07-21: 100 mL via INTRAVENOUS

## 2021-08-02 ENCOUNTER — Ambulatory Visit: Payer: BC Managed Care – PPO | Admitting: *Deleted

## 2021-09-23 ENCOUNTER — Encounter (HOSPITAL_COMMUNITY): Payer: Self-pay | Admitting: Radiology

## 2021-10-11 ENCOUNTER — Inpatient Hospital Stay
Admission: EM | Admit: 2021-10-11 | Discharge: 2021-10-12 | DRG: 247 | Disposition: A | Discharge status: Home / Self Care | Payer: BC Managed Care – PPO | Attending: Internal Medicine | Admitting: Internal Medicine

## 2021-10-11 ENCOUNTER — Encounter
Admission: EM | Disposition: A | Discharge status: Home / Self Care | Payer: Self-pay | Source: Home / Self Care | Attending: Internal Medicine

## 2021-10-11 DIAGNOSIS — Z955 Presence of coronary angioplasty implant and graft: Secondary | ICD-10-CM

## 2021-10-11 DIAGNOSIS — Z880 Allergy status to penicillin: Secondary | ICD-10-CM

## 2021-10-11 DIAGNOSIS — I252 Old myocardial infarction: Secondary | ICD-10-CM | POA: Diagnosis not present

## 2021-10-11 DIAGNOSIS — I2111 ST elevation (STEMI) myocardial infarction involving right coronary artery: Principal | ICD-10-CM | POA: Diagnosis present

## 2021-10-11 DIAGNOSIS — Z8249 Family history of ischemic heart disease and other diseases of the circulatory system: Secondary | ICD-10-CM

## 2021-10-11 DIAGNOSIS — E119 Type 2 diabetes mellitus without complications: Secondary | ICD-10-CM | POA: Diagnosis present

## 2021-10-11 DIAGNOSIS — Z79899 Other long term (current) drug therapy: Secondary | ICD-10-CM

## 2021-10-11 DIAGNOSIS — Z7902 Long term (current) use of antithrombotics/antiplatelets: Secondary | ICD-10-CM

## 2021-10-11 DIAGNOSIS — I213 ST elevation (STEMI) myocardial infarction of unspecified site: Secondary | ICD-10-CM | POA: Diagnosis present

## 2021-10-11 DIAGNOSIS — F17210 Nicotine dependence, cigarettes, uncomplicated: Secondary | ICD-10-CM | POA: Diagnosis present

## 2021-10-11 DIAGNOSIS — Z7982 Long term (current) use of aspirin: Secondary | ICD-10-CM

## 2021-10-11 DIAGNOSIS — I10 Essential (primary) hypertension: Secondary | ICD-10-CM | POA: Diagnosis present

## 2021-10-11 DIAGNOSIS — Z20822 Contact with and (suspected) exposure to covid-19: Secondary | ICD-10-CM | POA: Diagnosis present

## 2021-10-11 DIAGNOSIS — E1159 Type 2 diabetes mellitus with other circulatory complications: Secondary | ICD-10-CM

## 2021-10-11 DIAGNOSIS — E785 Hyperlipidemia, unspecified: Secondary | ICD-10-CM | POA: Diagnosis present

## 2021-10-11 DIAGNOSIS — Z7984 Long term (current) use of oral hypoglycemic drugs: Secondary | ICD-10-CM | POA: Diagnosis not present

## 2021-10-11 DIAGNOSIS — I2510 Atherosclerotic heart disease of native coronary artery without angina pectoris: Secondary | ICD-10-CM | POA: Diagnosis present

## 2021-10-11 DIAGNOSIS — K219 Gastro-esophageal reflux disease without esophagitis: Secondary | ICD-10-CM | POA: Diagnosis present

## 2021-10-11 DIAGNOSIS — Z888 Allergy status to other drugs, medicaments and biological substances status: Secondary | ICD-10-CM

## 2021-10-11 HISTORY — DX: Presence of coronary angioplasty implant and graft: Z95.5

## 2021-10-11 HISTORY — PX: LEFT HEART CATH AND CORONARY ANGIOGRAPHY: CATH118249

## 2021-10-11 HISTORY — PX: CORONARY/GRAFT ACUTE MI REVASCULARIZATION: CATH118305

## 2021-10-11 LAB — COMPREHENSIVE METABOLIC PANEL
ALT: 12 U/L (ref 0–44)
AST: 18 U/L (ref 15–41)
Albumin: 3.4 g/dL — ABNORMAL LOW (ref 3.5–5.0)
Alkaline Phosphatase: 49 U/L (ref 38–126)
Anion gap: 8 (ref 5–15)
BUN: 11 mg/dL (ref 6–20)
CO2: 19 mmol/L — ABNORMAL LOW (ref 22–32)
Calcium: 8.9 mg/dL (ref 8.9–10.3)
Chloride: 107 mmol/L (ref 98–111)
Creatinine, Ser: 0.89 mg/dL (ref 0.44–1.00)
GFR, Estimated: >60 mL/min (ref >60)
Glucose, Bld: 118 mg/dL — ABNORMAL HIGH (ref 70–99)
Potassium: 3.6 mmol/L (ref 3.5–5.1)
Sodium: 134 mmol/L — ABNORMAL LOW (ref 135–145)
Total Bilirubin: 0.3 mg/dL (ref 0.3–1.2)
Total Protein: 6.9 g/dL (ref 6.5–8.1)

## 2021-10-11 LAB — CBC WITH DIFFERENTIAL/PLATELET
Abs Immature Granulocytes: 0.02 10*3/uL (ref 0.00–0.07)
Basophils Absolute: 0 10*3/uL (ref 0.0–0.1)
Basophils Relative: 1 %
Eosinophils Absolute: 0.1 10*3/uL (ref 0.0–0.5)
Eosinophils Relative: 1 %
HCT: 34.4 % — ABNORMAL LOW (ref 36.0–46.0)
Hemoglobin: 10.6 g/dL — ABNORMAL LOW (ref 12.0–15.0)
Immature Granulocytes: 0 %
Lymphocytes Relative: 41 %
Lymphs Abs: 3.5 10*3/uL (ref 0.7–4.0)
MCH: 24.1 pg — ABNORMAL LOW (ref 26.0–34.0)
MCHC: 30.8 g/dL (ref 30.0–36.0)
MCV: 78.4 fL — ABNORMAL LOW (ref 80.0–100.0)
Monocytes Absolute: 0.8 10*3/uL (ref 0.1–1.0)
Monocytes Relative: 9 %
Neutro Abs: 4.1 10*3/uL (ref 1.7–7.7)
Neutrophils Relative %: 48 %
Platelets: 254 10*3/uL (ref 150–400)
RBC: 4.39 MIL/uL (ref 3.87–5.11)
RDW: 16 % — ABNORMAL HIGH (ref 11.5–15.5)
WBC: 8.5 10*3/uL (ref 4.0–10.5)
nRBC: 0 % (ref 0.0–0.2)

## 2021-10-11 LAB — POCT ACTIVATED CLOTTING TIME
Activated Clotting Time: 257 seconds
Activated Clotting Time: 239 seconds
Activated Clotting Time: 245 seconds
Activated Clotting Time: 257 seconds
Activated Clotting Time: 257 seconds
Activated Clotting Time: 263 seconds

## 2021-10-11 LAB — RESP PANEL BY RT-PCR (FLU A&B, COVID) ARPGX2
Influenza A by PCR: NEGATIVE
Influenza B by PCR: NEGATIVE
SARS Coronavirus 2 by RT PCR: NEGATIVE

## 2021-10-11 LAB — GLUCOSE, CAPILLARY: Glucose-Capillary: 132 mg/dL — ABNORMAL HIGH (ref 70–99)

## 2021-10-11 LAB — TROPONIN I (HIGH SENSITIVITY)
Troponin I (High Sensitivity): 95 ng/L — ABNORMAL HIGH (ref <18)
Troponin I (High Sensitivity): 204 ng/L (ref <18)

## 2021-10-11 LAB — CBG MONITORING, ED: Glucose-Capillary: 113 mg/dL — ABNORMAL HIGH (ref 70–99)

## 2021-10-11 LAB — MRSA NEXT GEN BY PCR, NASAL: MRSA by PCR Next Gen: NOT DETECTED

## 2021-10-11 SURGERY — CORONARY/GRAFT ACUTE MI REVASCULARIZATION
Anesthesia: Moderate Sedation

## 2021-10-11 MED ORDER — NITROGLYCERIN 1 MG/10 ML FOR IR/CATH LAB
INTRA_ARTERIAL | Status: AC
  Filled 2021-10-11: qty 10

## 2021-10-11 MED ORDER — SODIUM CHLORIDE 0.9 % IV SOLN: 250.0000 mL | INTRAVENOUS | Status: DC | PRN

## 2021-10-11 MED ORDER — VERAPAMIL HCL 2.5 MG/ML IV SOLN
INTRAVENOUS | Status: DC | PRN
  Administered 2021-10-11: 2.5 mg via INTRA_ARTERIAL

## 2021-10-11 MED ORDER — PRASUGREL HCL 10 MG PO TABS
ORAL_TABLET | ORAL | Status: DC | PRN
  Administered 2021-10-11: 60 mg via ORAL

## 2021-10-11 MED ORDER — MIDAZOLAM HCL 2 MG/2ML IJ SOLN
INTRAMUSCULAR | Status: AC
  Filled 2021-10-11: qty 2

## 2021-10-11 MED ORDER — MIDAZOLAM HCL 2 MG/2ML IJ SOLN
INTRAMUSCULAR | Status: DC | PRN
  Administered 2021-10-11: 1 mg via INTRAVENOUS

## 2021-10-11 MED ORDER — GABAPENTIN 300 MG PO CAPS: 300.0000 mg | ORAL_CAPSULE | Freq: Two times a day (BID) | ORAL | Status: DC | PRN

## 2021-10-11 MED ORDER — SODIUM CHLORIDE 0.9% FLUSH: 3.0000 mL | INTRAVENOUS | Status: DC | PRN

## 2021-10-11 MED ORDER — HEPARIN (PORCINE) IN NACL 1000-0.9 UT/500ML-% IV SOLN
INTRAVENOUS | Status: AC
  Filled 2021-10-11: qty 1000

## 2021-10-11 MED ORDER — FENTANYL CITRATE (PF) 100 MCG/2ML IJ SOLN
INTRAMUSCULAR | Status: DC | PRN
  Administered 2021-10-11: 25 ug via INTRAVENOUS

## 2021-10-11 MED ORDER — NITROGLYCERIN 0.4 MG SL SUBL: 0.4000 mg | SUBLINGUAL_TABLET | SUBLINGUAL | Status: DC | PRN

## 2021-10-11 MED ORDER — HEPARIN SODIUM (PORCINE) 1000 UNIT/ML IJ SOLN
INTRAMUSCULAR | Status: AC
  Filled 2021-10-11: qty 10

## 2021-10-11 MED ORDER — NITROGLYCERIN 1 MG/10 ML FOR IR/CATH LAB
INTRA_ARTERIAL | Status: DC | PRN
  Administered 2021-10-11 (×4): 200 ug via INTRACORONARY

## 2021-10-11 MED ORDER — SODIUM CHLORIDE 0.9 % IV BOLUS
INTRAVENOUS | Status: DC | PRN
  Administered 2021-10-11: 100 mL via INTRAVENOUS

## 2021-10-11 MED ORDER — PRASUGREL HCL 10 MG PO TABS
ORAL_TABLET | ORAL | Status: AC
  Filled 2021-10-11: qty 6

## 2021-10-11 MED ORDER — SODIUM CHLORIDE 0.9% FLUSH
3.0000 mL | Freq: Two times a day (BID) | INTRAVENOUS | Status: DC
  Administered 2021-10-11 – 2021-10-12 (×2): 3 mL via INTRAVENOUS

## 2021-10-11 MED ORDER — INSULIN ASPART 100 UNIT/ML IJ SOLN: 0.0000 [IU] | Freq: Three times a day (TID) | INTRAMUSCULAR | Status: DC

## 2021-10-11 MED ORDER — ASPIRIN 81 MG PO CHEW
81.0000 mg | CHEWABLE_TABLET | Freq: Every day | ORAL | Status: DC
Start: 2021-10-12 — End: 2021-10-12
  Administered 2021-10-12: 81 mg via ORAL
  Filled 2021-10-11: qty 1

## 2021-10-11 MED ORDER — PRASUGREL HCL 10 MG PO TABS
10.0000 mg | ORAL_TABLET | Freq: Every day | ORAL | Status: DC
  Administered 2021-10-12: 10 mg via ORAL
  Filled 2021-10-11: qty 1

## 2021-10-11 MED ORDER — ATORVASTATIN CALCIUM 80 MG PO TABS
80.0000 mg | ORAL_TABLET | Freq: Every day | ORAL | Status: DC
  Administered 2021-10-11: 80 mg via ORAL
  Filled 2021-10-11 (×2): qty 1

## 2021-10-11 MED ORDER — HYDRALAZINE HCL 20 MG/ML IJ SOLN: 10.0000 mg | INTRAMUSCULAR | Status: AC | PRN

## 2021-10-11 MED ORDER — HEPARIN (PORCINE) IN NACL 2000-0.9 UNIT/L-% IV SOLN
INTRAVENOUS | Status: DC | PRN
  Administered 2021-10-11: 1000 mL

## 2021-10-11 MED ORDER — CHLORHEXIDINE GLUCONATE CLOTH 2 % EX PADS
6.0000 | MEDICATED_PAD | Freq: Every day | CUTANEOUS | Status: DC
  Administered 2021-10-11 – 2021-10-12 (×2): 6 via TOPICAL

## 2021-10-11 MED ORDER — HEPARIN SODIUM (PORCINE) 1000 UNIT/ML IJ SOLN
INTRAMUSCULAR | Status: DC | PRN
  Administered 2021-10-11: 2000 [IU] via INTRAVENOUS
  Administered 2021-10-11: 6000 [IU] via INTRAVENOUS
  Administered 2021-10-11: 4000 [IU] via INTRAVENOUS
  Administered 2021-10-11: 5000 [IU] via INTRAVENOUS
  Administered 2021-10-11: 3000 [IU] via INTRAVENOUS

## 2021-10-11 MED ORDER — ENALAPRIL MALEATE 20 MG PO TABS
20.0000 mg | ORAL_TABLET | Freq: Every day | ORAL | Status: DC
  Administered 2021-10-12: 20 mg via ORAL
  Filled 2021-10-11: qty 1

## 2021-10-11 MED ORDER — ENOXAPARIN SODIUM 40 MG/0.4ML IJ SOSY
40.0000 mg | PREFILLED_SYRINGE | INTRAMUSCULAR | Status: DC
  Filled 2021-10-11: qty 0.4

## 2021-10-11 MED ORDER — CARVEDILOL 25 MG PO TABS
25.0000 mg | ORAL_TABLET | Freq: Two times a day (BID) | ORAL | Status: DC
  Administered 2021-10-11 – 2021-10-12 (×2): 25 mg via ORAL
  Filled 2021-10-11 (×2): qty 1

## 2021-10-11 MED ORDER — SODIUM CHLORIDE 0.9 % IV SOLN
INTRAVENOUS | Status: DC | PRN
  Administered 2021-10-11 (×2): 50 mL/h via INTRAVENOUS

## 2021-10-11 MED ORDER — POLYETHYLENE GLYCOL 3350 17 G PO PACK: 17.0000 g | PACK | Freq: Every day | ORAL | Status: DC | PRN

## 2021-10-11 MED ORDER — ONDANSETRON HCL 4 MG/2ML IJ SOLN: 4.0000 mg | Freq: Four times a day (QID) | INTRAMUSCULAR | Status: DC | PRN

## 2021-10-11 MED ORDER — LORATADINE 10 MG PO TABS
10.0000 mg | ORAL_TABLET | Freq: Every day | ORAL | Status: DC
  Administered 2021-10-11: 10 mg via ORAL
  Filled 2021-10-11: qty 1

## 2021-10-11 MED ORDER — IOHEXOL 300 MG/ML  SOLN
INTRAMUSCULAR | Status: DC | PRN
  Administered 2021-10-11: 150 mL

## 2021-10-11 MED ORDER — SPIRONOLACTONE 25 MG PO TABS
25.0000 mg | ORAL_TABLET | Freq: Every day | ORAL | Status: DC
  Administered 2021-10-12: 25 mg via ORAL
  Filled 2021-10-11: qty 1

## 2021-10-11 MED ORDER — LIDOCAINE HCL (PF) 1 % IJ SOLN
INTRAMUSCULAR | Status: DC | PRN
  Administered 2021-10-11: 2 mL

## 2021-10-11 MED ORDER — BISACODYL 5 MG PO TBEC: 5.0000 mg | DELAYED_RELEASE_TABLET | Freq: Every day | ORAL | Status: DC | PRN

## 2021-10-11 MED ORDER — PANTOPRAZOLE SODIUM 40 MG PO TBEC
40.0000 mg | DELAYED_RELEASE_TABLET | Freq: Two times a day (BID) | ORAL | Status: DC
  Administered 2021-10-11: 40 mg via ORAL
  Filled 2021-10-11 (×2): qty 1

## 2021-10-11 MED ORDER — RANOLAZINE ER 500 MG PO TB12
1000.0000 mg | ORAL_TABLET | Freq: Two times a day (BID) | ORAL | Status: DC
  Administered 2021-10-11 – 2021-10-12 (×2): 1000 mg via ORAL
  Filled 2021-10-11 (×3): qty 2

## 2021-10-11 MED ORDER — LIDOCAINE HCL 1 % IJ SOLN
INTRAMUSCULAR | Status: AC
  Filled 2021-10-11: qty 20

## 2021-10-11 MED ORDER — SODIUM CHLORIDE 0.9 % IV SOLN: INTRAVENOUS | Status: AC

## 2021-10-11 MED ORDER — VERAPAMIL HCL 2.5 MG/ML IV SOLN
INTRAVENOUS | Status: AC
  Filled 2021-10-11: qty 2

## 2021-10-11 MED ORDER — ISOSORBIDE MONONITRATE ER 60 MG PO TB24
120.0000 mg | ORAL_TABLET | Freq: Every day | ORAL | Status: DC
  Administered 2021-10-12: 120 mg via ORAL
  Filled 2021-10-11: qty 2

## 2021-10-11 MED ORDER — FENTANYL CITRATE (PF) 100 MCG/2ML IJ SOLN
INTRAMUSCULAR | Status: AC
  Filled 2021-10-11: qty 2

## 2021-10-11 MED ORDER — ACETAMINOPHEN 325 MG PO TABS: 650.0000 mg | ORAL_TABLET | ORAL | Status: DC | PRN

## 2021-10-11 SURGICAL SUPPLY — 26 items
BALLN EUPHORA RX 2.0X12 (BALLOONS) ×2
BALLN ~~LOC~~ TREK RX 2.5X8 (BALLOONS) ×2
BALLN ~~LOC~~ TREK RX 2.75X15 (BALLOONS) ×2
BALLN ~~LOC~~ TREK RX 3.0X8 (BALLOONS) ×2
BALLOON EUPHORA RX 2.0X12 (BALLOONS) IMPLANT
BALLOON ~~LOC~~ TREK RX 2.5X8 (BALLOONS) IMPLANT
BALLOON ~~LOC~~ TREK RX 2.75X15 (BALLOONS) IMPLANT
BALLOON ~~LOC~~ TREK RX 3.0X8 (BALLOONS) IMPLANT
CATH 5F 110X4 TIG (CATHETERS) ×1 IMPLANT
CATH INFINITI 5FR ANG PIGTAIL (CATHETERS) ×1 IMPLANT
CATH VISTA GUIDE 6FR JR4 (CATHETERS) ×1 IMPLANT
DEVICE RAD TR BAND REGULAR (VASCULAR PRODUCTS) ×1 IMPLANT
DRAPE BRACHIAL (DRAPES) ×1 IMPLANT
GLIDESHEATH SLEND A-KIT 6F 22G (SHEATH) ×1 IMPLANT
GUIDEWIRE INQWIRE 1.5J.035X260 (WIRE) IMPLANT
INQWIRE 1.5J .035X260CM (WIRE) ×2
KIT ENCORE 26 ADVANTAGE (KITS) ×1 IMPLANT
PACK CARDIAC CATH (CUSTOM PROCEDURE TRAY) ×2 IMPLANT
PROTECTION STATION PRESSURIZED (MISCELLANEOUS) ×2
SET ATX SIMPLICITY (MISCELLANEOUS) ×1 IMPLANT
STATION PROTECTION PRESSURIZED (MISCELLANEOUS) IMPLANT
STENT ONYX FRONTIER 2.25X22 (Permanent Stent) ×1 IMPLANT
STENT ONYX FRONTIER 2.5X22 (Permanent Stent) ×1 IMPLANT
WIRE ASAHI PROWATER 180CM (WIRE) ×1 IMPLANT
WIRE G HI TQ BMW 190 (WIRE) ×1 IMPLANT
WIRE RUNTHROUGH .014X180CM (WIRE) ×1 IMPLANT

## 2021-10-11 NOTE — ED Triage Notes (Signed)
Pt BIBA for 9/10 chest pressure that began at 1500. Pt a/ox4, states she took 2 NTG PTA. +SOB, nausea ?

## 2021-10-11 NOTE — H&P (Signed)
? ? ?History and Physical:  ? ? ?Sonya [name]  ? ?KQ:[id number] DOB: 1981-01-11 DOA: 10/11/2021 ? ?Referring MD/provider: Dr End  ?PCP: Danelle Berry, NP  ? ?Patient coming from: Home ? ?Chief Complaint: SSCP ? ?History of Present Illness:  ? ?Sonya [name] is an 41 y.o. female with HTN, dyslipidemia, known CAD s/p DES 2019 and October 2022, ongoing tobacco use presented to the ED earlier today with substernal chest pressure without radiation which had been present continuously for 2 days both at rest and at exertion.  However after taking a nap today, patient woke up around 3 PM this afternoon and pain had increased significantly.  Patient had no associated shortness of breath but she did have some diaphoresis.  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 ? ?In the ambulance patient was given aspirin 324 mg and a code stroke was called due to ST elevations inferiorly.  Patient was taken directly to the Cath Lab. Patient was found to have acute plaque rupture with thrombotic occlusion of the mid RCA which was successfully stented. She was also noted to have a 90% stenosis of distal RCA at the bifurcation which was also successfully   of note she did have a strong collateral that branched off prior to the stenotic area. ? ? ?ROS:  ? ?ROS  ? ?Review of Systems: ?Per HPI ? ? ?Past Medical History:  ? ?Past Medical History:  ?Diagnosis Date  ? Coronary artery disease   ? GERD (gastroesophageal reflux disease)   ? H/O heart artery stent   ? Tobacco abuse   ? ? ?Past Surgical History:  ? ?Past Surgical History:  ?Procedure Laterality Date  ? CORONARY STENT INTERVENTION N/A 06/10/2018  ? Procedure: CORONARY STENT INTERVENTION;  Surgeon: Yolonda Kida, MD;  Location: [place] [facility];  Service: Cardiovascular;  Laterality: N/A;  ? CORONARY STENT INTERVENTION N/A 04/14/2021  ? Procedure: CORONARY STENT INTERVENTION;  Surgeon: Isaias Cowman, MD;   Location: [place] [facility];  Service: Cardiovascular;  Laterality: N/A;  ? LEFT HEART CATH AND CORONARY ANGIOGRAPHY N/A 06/10/2018  ? Procedure: With coronary intervention and stenting;  Surgeon: Dionisio David, MD;  Location: [place] [facility];  Service: Cardiovascular;  Laterality: N/A;  ? LEFT HEART CATH AND CORONARY ANGIOGRAPHY Left 06/15/2020  ? Procedure: LEFT HEART CATH AND CORONARY ANGIOGRAPHY;  Surgeon: Dionisio David, MD;  Location: [place] [facility];  Service: Cardiovascular;  Laterality: Left;  ? LEFT HEART CATH AND CORONARY ANGIOGRAPHY N/A 04/14/2021  ? Procedure: LEFT HEART CATH AND CORONARY ANGIOGRAPHY with intervention;  Surgeon: Dionisio David, MD;  Location: [place] [facility];  Service: Cardiovascular;  Laterality: N/A;  ? TUBAL LIGATION    ? ? ?Social History:  ? ?Social History  ? ?Socioeconomic History  ? Marital status: Legally Separated  ?  Spouse name: Not on file  ? Number of children: 4  ? Years of education: Not on file  ? Highest education level: Not on file  ?Occupational History  ? Not on file  ?Tobacco Use  ? Smoking status: Every Day  ?  Packs/day: 0.50  ?  Types: Cigarettes  ? Smokeless tobacco: Never  ?Vaping Use  ? Vaping Use: Never used  ?Substance and Sexual Activity  ? Alcohol use: Yes  ?  Comment: occasionally  ? Drug use: Never  ? Sexual activity: Not on file  ?Other  Topics Concern  ? Not on file  ?Social History Narrative  ? Lives at home with all her children  ? ?Social Determinants of Health  ? ?Financial Resource Strain: Not on file  ?Food Insecurity: Not on file  ?Transportation Needs: Not on file  ?Physical Activity: Not on file  ?Stress: Not on file  ?Social Connections: Not on file  ?Intimate Partner Violence: Not on file  ? ? ?Allergies  ? ?Penicillins and Metoprolol ? ?Family history:  ? ?Family History  ?Problem Relation Age of Onset  ? Heart attack Mother   ? Heart attack Father   ? ? ?Current Medications:  ? ?Prior to Admission medications    ?Medication Sig Start Date End Date Taking? Authorizing Provider  ?aspirin (ASPIRIN CHILDRENS) 81 MG chewable tablet Chew 1 tablet (81 mg total) by mouth daily. 06/11/18   Loletha Grayer, MD  ?Bacillus Coagulans-Inulin (PROBIOTIC) 1-250 BILLION-MG CAPS Take 1 capsule by mouth daily. 11/05/20   Ward, Delice Bison, DO  ?carvedilol (COREG) 25 MG tablet Take 25 mg by mouth 2 (two) times daily. 05/26/20   [provider]  ?cetirizine (ZYRTEC) 10 MG tablet Take 10 mg by mouth at bedtime. 05/15/21   [provider]  ?Cholecalciferol (VITAMIN D3) 125 MCG (5000 UT) CAPS Take 5,000 Units by mouth daily.    [provider]  ?clopidogrel (PLAVIX) 75 MG tablet Take 1 tablet (75 mg total) by mouth daily. 04/16/21   Fritzi Mandes, MD  ?enalapril (VASOTEC) 20 MG tablet Take 20 mg by mouth daily. 06/08/20   [provider]  ?gabapentin (NEURONTIN) 300 MG capsule Take 300 mg by mouth 2 (two) times daily as needed for pain. 01/13/20   [provider]  ?ibuprofen (ADVIL) 800 MG tablet Take 1 tablet (800 mg total) by mouth every 8 (eight) hours as needed for mild pain. 11/05/20   Ward, Delice Bison, DO  ?isosorbide mononitrate (IMDUR) 120 MG 24 hr tablet Take 120 mg by mouth 2 (two) times daily. 07/27/21   [provider]  ?isosorbide mononitrate (IMDUR) 60 MG 24 hr tablet Take 60 mg by mouth in the morning and at bedtime.    [provider]  ?metFORMIN (GLUCOPHAGE) 500 MG tablet Take 500 mg by mouth 2 (two) times daily. 07/25/21   [provider]  ?nicotine (NICODERM CQ - DOSED IN MG/24 HOURS) 21 mg/24hr patch Place 1 patch (21 mg total) onto the skin daily. 04/15/21   Fritzi Mandes, MD  ?nitroGLYCERIN (NITROSTAT) 0.4 MG SL tablet Place 1 tablet (0.4 mg total) under the tongue every 5 (five) minutes as needed for chest pain. 04/15/21   Fritzi Mandes, MD  ?nystatin cream (MYCOSTATIN) Apply 1 application topically at bedtime as needed (irritation). 04/01/21   [provider]   ?pantoprazole (PROTONIX) 40 MG tablet Take 40 mg by mouth 2 (two) times daily. 02/24/20   [provider]  ?pravastatin (PRAVACHOL) 40 MG tablet Take 40 mg by mouth daily. 05/26/20   [provider]  ?ranolazine (RANEXA) 1000 MG SR tablet Take 1,000 mg by mouth 2 (two) times daily. 06/08/20   [provider]  ?spironolactone (ALDACTONE) 25 MG tablet Take 25 mg by mouth daily. 05/26/20   [provider]  ? ? ?Physical Exam:  ? ?Vitals:  ? 10/11/21 1554 10/11/21 1555 10/11/21 1608 10/11/21 1800  ?BP:      ?Pulse:    72  ?Resp:    17  ?Temp:    98.1 ?F (36.7 ?C)  ?  TempSrc:    Oral  ?SpO2: 100%  100% 100%  ?Weight:  121.6 kg    ?Height:  5\' 7"  (1.702 m)  5\' 7"  (1.702 m)  ? ? ? ?Physical Exam: ?Blood pressure 115/72, pulse 72, temperature 98.1 ?F (36.7 ?C), temperature source Oral, resp. rate 17, height 5\' 7"  (1.702 m), weight 121.6 kg, SpO2 100 %. ?Gen: Somewhat sleepy appearing female lying in bed surrounded by caring family ?Eyes: sclera anicteric, conjuctiva mildly injected bilaterally ?CVS: S1-S2, regulary, no gallops ?Respiratory:  decreased air entry likely secondary to decreased inspiratory effort ?GI: NABS, soft, NT  ?LE: No edema.  Sheath in place. ?Neuro: A/O x 3, grossly nonfocal.  ?Psych: patient is logical and coherent, judgement and insight appear normal, mood and affect appropriate to situation. ? ?Data Review:  ? ? ?Labs: ?Basic Metabolic Panel: ?Recent Labs  ?Lab 10/11/21 ?1552  ?NA 134*  ?K 3.6  ?CL 107  ?CO2 19*  ?GLUCOSE 118*  ?BUN 11  ?CREATININE 0.89  ?CALCIUM 8.9  ? ?Liver Function Tests: ?Recent Labs  ?Lab 10/11/21 ?1552  ?AST 18  ?ALT 12  ?ALKPHOS 49  ?BILITOT 0.3  ?PROT 6.9  ?ALBUMIN 3.4*  ? ?No results for input(s): LIPASE, AMYLASE in the last 168 hours. ?No results for input(s): AMMONIA in the last 168 hours. ?CBC: ?Recent Labs  ?Lab 10/11/21 ?1552  ?WBC 8.5  ?NEUTROABS 4.1  ?HGB 10.6*  ?HCT 34.4*  ?MCV 78.4*  ?PLT 254  ? ?Cardiac Enzymes: ?No results for  input(s): CKTOTAL, CKMB, CKMBINDEX, TROPONINI in the last 168 hours. ? ?BNP (last 3 results) ?No results for input(s): PROBNP in the last 8760 hours. ?CBG: ?Recent Labs  ?Lab 10/11/21 ?1556  ?GLUCAP 113*  ? ? ?U

## 2021-10-11 NOTE — ED Notes (Signed)
Pt to cath lab via stretcher with Dr. Okey Dupre.  ?

## 2021-10-11 NOTE — ED Provider Notes (Signed)
? ?[facility] ?Provider Note ? ? ? Event Date/Time  ? First MD Initiated Contact with Patient 10/11/21 1556   ?  (approximate) ? ? ?History  ? ?Chief Complaint ?Code STEMI ? ? ?HPI ? ?Sonya [name] is a 41 y.o. female with past medical history of hypertension, hyperlipidemia, CAD, and GERD who presents to the ED complaining of chest pain.  Patient reports that she has been dealing with pressure in the center of her chest constantly for the past 2 days.  She describes it as relatively mild and states she was able to ignore it, still went to work overnight last night.  She went to sleep when she got home from work this morning, woke up around noon and states the pain was slightly worse at that time.  She again went to sleep, and when she woke up around 3 PM this afternoon, the pain was much more severe.  She took 2 nitro at home without significant relief, subsequently called EMS.  She was given 324 mg of aspirin and initial EKG was concerning for inferior STEMI, code STEMI activated prior to arrival.  EMS reports that blood pressure was borderline low during transport and so she was not given any additional nitroglycerin.  Patient reports her last stent was placed in October of last year, has been compliant with aspirin and Plavix since then. ?  ? ? ?Physical Exam  ? ?Triage Vital Signs: ?ED Triage Vitals  ?Enc Vitals Group  ?   BP 10/11/21 1551 115/72  ?   Pulse Rate 10/11/21 1551 72  ?   Resp 10/11/21 1551 14  ?   Temp 10/11/21 1551 98.6 ?F (37 ?C)  ?   Temp Source 10/11/21 1551 Oral  ?   SpO2 10/11/21 1551 100 %  ?   Weight 10/11/21 1555 268 lb 1.3 oz (121.6 kg)  ?   Height 10/11/21 1555 5\' 7"  (1.702 m)  ?   Head Circumference --   ?   Peak Flow --   ?   Pain Score 10/11/21 1555 9  ?   Pain Loc --   ?   Pain Edu? --   ?   Excl. in GC? --   ? ? ?Most recent vital signs: ?Vitals:  ? 10/11/21 1554 10/11/21 1608  ?BP:    ?Pulse:    ?Resp:    ?Temp:    ?SpO2: 100% 100%  ? ? ?Constitutional:  Alert and oriented. ?Eyes: Conjunctivae are normal. ?Head: Atraumatic. ?Nose: No congestion/rhinnorhea. ?Mouth/Throat: Mucous membranes are moist.  ?Cardiovascular: Normal rate, regular rhythm. Grossly normal heart sounds.  2+ radial pulses bilaterally. ?Respiratory: Normal respiratory effort.  No retractions. Lungs CTAB. ?Gastrointestinal: Soft and nontender. No distention. ?Musculoskeletal: No lower extremity tenderness nor edema.  ?Neurologic:  Normal speech and language. No gross focal neurologic deficits are appreciated. ? ? ? ?ED Results / Procedures / Treatments  ? ?Labs ?(all labs ordered are listed, but only abnormal results are displayed) ?Labs Reviewed  ?CBG MONITORING, ED - Abnormal; Notable for the following components:  ?    Result Value  ? Glucose-Capillary 113 (*)   ? All other components within normal limits  ?RESP PANEL BY RT-PCR (FLU A&B, COVID) ARPGX2  ?CBC WITH DIFFERENTIAL/PLATELET  ?COMPREHENSIVE METABOLIC PANEL  ?TROPONIN I (HIGH SENSITIVITY)  ? ? ? ?EKG ? ?ED ECG REPORT ?10/13/21, the attending physician, personally viewed and interpreted this ECG. ? ? Date: 10/11/2021 ? EKG Time: 15:51 ? Rate:  71 ? Rhythm: normal sinus rhythm ? Axis: Normal ? Intervals:none ? ST&T Change: Inferior ST elevation with anterior ST depression and T wave inversion. ? ?PROCEDURES: ? ?Critical Care performed: Yes, see critical care procedure note(s) ? ?.Critical Care ?Performed by: Chesley Noon, MD ?Authorized by: Chesley Noon, MD  ? ?Critical care provider statement:  ?  Critical care time (minutes):  20 ?  Critical care time was exclusive of:  Separately billable procedures and treating other patients and teaching time ?  Critical care was necessary to treat or prevent imminent or life-threatening deterioration of the following conditions:  Cardiac failure ?  Critical care was time spent personally by me on the following activities:  Development of treatment plan with patient or surrogate,  discussions with consultants, evaluation of patient's response to treatment, examination of patient, ordering and review of laboratory studies, ordering and review of radiographic studies, ordering and performing treatments and interventions, pulse oximetry, re-evaluation of patient's condition and review of old charts ?  I assumed direction of critical care for this patient from another provider in my specialty: no   ?  Care discussed with: admitting provider   ?.1-3 Lead EKG Interpretation ?Performed by: Chesley Noon, MD ?Authorized by: Chesley Noon, MD  ? ?  Interpretation: normal   ?  ECG rate:  65-80 ?  ECG rate assessment: normal   ?  Rhythm: sinus rhythm   ?  Ectopy: none   ?  Conduction: normal   ? ? ?MEDICATIONS ORDERED IN ED: ?Medications - No data to display ? ? ?IMPRESSION / MDM / ASSESSMENT AND PLAN / ED COURSE  ?I reviewed the triage vital signs and the nursing notes. ?             ?               ? ?41 y.o. female with past medical history of hypertension, hyperlipidemia, CAD, and GERD who presents to the ED complaining of constant mild chest pain for the past 2 days that became severe today around 3 PM, prehospital EKG concerning for STEMI. ? ?Differential diagnosis includes, but is not limited to, STEMI, NSTEMI, unstable angina, dissection, PE, pneumothorax. ? ?Patient uncomfortable appearing but in no acute distress, vital signs stable here in the ED with improved blood pressure.  Initial EMS EKG was reviewed and consistent with acute inferior STEMI, code STEMI activated prior to arrival.  Follow-up EKG here in the ED is improved but still has ST elevation inferiorly, patient complains of ongoing 9 out of 10 chest pressure.  She was loaded with aspirin by EMS, Dr. Okey Dupre of cardiology at the bedside and will take patient to the Cath Lab. ? ?The patient is on the cardiac monitor to evaluate for evidence of arrhythmia and/or significant heart rate changes. ? ?  ? ? ?FINAL CLINICAL IMPRESSION(S) /  ED DIAGNOSES  ? ?Final diagnoses:  ?ST elevation myocardial infarction (STEMI), unspecified artery (HCC)  ? ? ? ?Rx / DC Orders  ? ?ED Discharge Orders   ? ? None  ? ?  ? ? ? ?Note:  This document was prepared using Dragon voice recognition software and may include unintentional dictation errors. ?  ?Chesley Noon, MD ?10/11/21 1610 ? ?

## 2021-10-11 NOTE — Consult Note (Signed)
?Cardiology Consultation:  ? ?Patient ID: Sonya [name] ?MRN: [id number]; DOB: 08-Feb-1981 ? ?Admit date: 10/11/2021 ?Date of Consult: 10/11/2021 ? ?PCP:  Sonya Helper, NP ?  ?[facility] Providers ?Cardiologist:  Sonya Blackwater, MD   ? ? ?Patient Profile:  ? ?Sonya [name] is a 41 y.o. female with a hx of coronary artery disease s/p PCI's to LAD and LCx (most recently 03/2021), hypertension, hyperlipidemia, type 2 diabetes mellitus, who is being seen 10/11/2021 for the evaluation of chest pain and abnormal EKG at the request of Dr. Larinda Buttery. ? ?History of Present Illness:  ? ?Ms. Camey reports waking up around 3 PM from a nap with severe substernal chest pain radiating to her shoulders and back.  She describes it as an intense pressure/heaviness reminiscent of what she experienced with her first MI a few years ago.  She reports associated shortness of breath but no nausea, lightheadedness, diaphoresis, or palpitations.  She took sublingual nitroglycerin at home with minimal improvement in her pain and subsequently called 911.  When EMS arrived, EKG showed subtle inferior ST elevation concerning for STEMI.  She was transported to the [facility] emergency department, where subtle inferior ST elevation persisted.  She also complained of ongoing 9/10 chest pain.  Decision was made to proceed with cardiac catheterization and possible PCI. ? ?Cardiac catheterization showed patent stents in the LAD and circumflex.  There was acute plaque rupture and thrombotic occlusion of the mid RCA with TIMI-1 flow.  Distal RCA also showed a 90% stenosis at the bifurcation (Medina 1,0,0).  Both lesions were successfully treated with stents, leading to resolution in the patient's chest pain. ? ? ?Past Medical History:  ?Diagnosis Date  ? Coronary artery disease   ? GERD (gastroesophageal reflux disease)   ? H/O heart artery stent   ? Tobacco abuse   ? ? ?Past Surgical History:  ?Procedure Laterality Date  ? CORONARY STENT INTERVENTION N/A  06/10/2018  ? Procedure: CORONARY STENT INTERVENTION;  Surgeon: Alwyn Pea, MD;  Location: [facility] INVASIVE CV LAB;  Service: Cardiovascular;  Laterality: N/A;  ? CORONARY STENT INTERVENTION N/A 04/14/2021  ? Procedure: CORONARY STENT INTERVENTION;  Surgeon: Marcina Millard, MD;  Location: [facility] INVASIVE CV LAB;  Service: Cardiovascular;  Laterality: N/A;  ? LEFT HEART CATH AND CORONARY ANGIOGRAPHY N/A 06/10/2018  ? Procedure: With coronary intervention and stenting;  Surgeon: Laurier Nancy, MD;  Location: [facility] INVASIVE CV LAB;  Service: Cardiovascular;  Laterality: N/A;  ? LEFT HEART CATH AND CORONARY ANGIOGRAPHY Left 06/15/2020  ? Procedure: LEFT HEART CATH AND CORONARY ANGIOGRAPHY;  Surgeon: Laurier Nancy, MD;  Location: [facility] INVASIVE CV LAB;  Service: Cardiovascular;  Laterality: Left;  ? LEFT HEART CATH AND CORONARY ANGIOGRAPHY N/A 04/14/2021  ? Procedure: LEFT HEART CATH AND CORONARY ANGIOGRAPHY with intervention;  Surgeon: Laurier Nancy, MD;  Location: [facility] INVASIVE CV LAB;  Service: Cardiovascular;  Laterality: N/A;  ? TUBAL LIGATION    ?  ? ?Inpatient Medications: ?Scheduled Meds: ? ?Continuous Infusions: ? ?PRN Meds: ? ? ?Allergies:    ?Allergies  ?Allergen Reactions  ? Penicillins Hives  ? Metoprolol Hives  ? ? ?Social History:   ?Social History  ? ?Tobacco Use  ? Smoking status: Former  ?  Packs/day: 0.50  ?  Types: Cigarettes  ?  Quit date: 03/2021  ?  Years since quitting: 0.5  ? Smokeless tobacco: Never  ?Vaping Use  ? Vaping Use: Never used  ?Substance Use Topics  ? Alcohol use:  Yes  ?  Comment: occasionally  ? Drug use: Never  ? ? ?  ?Family History:   ?Family History  ?Problem Relation Age of Onset  ? Heart attack Mother   ? Heart attack Father   ?  ? ?ROS:  ?Please see the history of present illness. All other ROS reviewed and negative.    ? ?Physical Exam/Data:  ? ?Vitals:  ? 10/11/21 1554 10/11/21 1555 10/11/21 1608 10/11/21 1800  ?BP:      ?Pulse:    72  ?Resp:    17  ?Temp:     98.1 ?F (36.7 ?C)  ?TempSrc:    Oral  ?SpO2: 100%  100% 100%  ?Weight:  121.6 kg    ?Height:  5\' 7"  (1.702 m)  5\' 7"  (1.702 m)  ? ?No intake or output data in the 24 hours ending 10/11/21 1812 ? ?  10/11/2021  ?  3:55 PM 04/14/2021  ?  2:04 PM 04/11/2021  ?  8:01 PM  ?Last 3 Weights  ?Weight (lbs) 268 lb 1.3 oz 246 lb 11.2 oz 251 lb  ?Weight (kg) 121.6 kg 111.902 kg 113.853 kg  ?   ?Body mass index is 41.99 kg/m?.  ?General: Morbidly obese woman, lying on stretcher.  She appears uncomfortable and is tearful. ?HEENT: normal ?Neck: No gross JVD though body habitus limits evaluation. ?Vascular: No carotid bruits; 2+ radial pulses bilaterally. ?Cardiac:  normal S1, S2; RRR; no murmurs, rubs, or gallops. ?Lungs:  clear to auscultation bilaterally, no wheezing, rhonchi or rales  ?Abd: soft, nontender, no hepatomegaly  ?Ext: no edema ?Musculoskeletal:  No deformities, BUE and BLE strength normal and equal ?Skin: warm and dry  ?Neuro:  CNs 2-12 intact, no focal abnormalities noted ?Psych: Anxious and tearful. ? ?EKG:  The EKG was personally reviewed and demonstrates: Normal sinus rhythm with borderline ST elevation in leads III and aVF of up to 1 mm.  ST depression in V2 and V3 also present. ?Telemetry:  Telemetry was personally reviewed and demonstrates: Normal sinus rhythm. ? ?Relevant CV Studies: ?Catheterization showed nonobstructive left coronary artery disease with patent LAD and LCx stents.  There was thrombotic occlusion of the mid RCA with TIMI I flow and 90% stenosis of the distal RCA at the bifurcation.  LVEDP was mildly elevated.  Patient underwent successful PCI to the mid and distal RCA with nonoverlapping stents. ? ?Laboratory Data: ? ?High Sensitivity Troponin:   ?Recent Labs  ?Lab 10/11/21 ?1552  ?TROPONINIHS 95*  ?   ?Chemistry ?Recent Labs  ?Lab 10/11/21 ?1552  ?NA 134*  ?K 3.6  ?CL 107  ?CO2 19*  ?GLUCOSE 118*  ?BUN 11  ?CREATININE 0.89  ?CALCIUM 8.9  ?GFRNONAA >60  ?ANIONGAP 8  ?  ?Recent Labs  ?Lab  10/11/21 ?1552  ?PROT 6.9  ?ALBUMIN 3.4*  ?AST 18  ?ALT 12  ?ALKPHOS 49  ?BILITOT 0.3  ? ?Lipids No results for input(s): CHOL, TRIG, HDL, LABVLDL, LDLCALC, CHOLHDL in the last 168 hours.  ?Hematology ?Recent Labs  ?Lab 10/11/21 ?1552  ?WBC 8.5  ?RBC 4.39  ?HGB 10.6*  ?HCT 34.4*  ?MCV 78.4*  ?MCH 24.1*  ?MCHC 30.8  ?RDW 16.0*  ?PLT 254  ? ?Thyroid No results for input(s): TSH, FREET4 in the last 168 hours.  ?BNPNo results for input(s): BNP, PROBNP in the last 168 hours.  ?DDimer No results for input(s): DDIMER in the last 168 hours. ? ? ?Radiology/Studies:  ?No results found. ? ? ?Assessment and Plan:  ? ?  Inferior STEMI: ?Patient presents with acute onset of chest pain found to have subtle inferior ST elevation.  Acute RCA occlusion was confirmed by catheterization consistent with acute plaque rupture MI.  She is status post PCI to the middle and distal RCA with resolution of chest pain. ?-Admit to ICU.  Appreciate assistance of the hospitalist service.  [facility] will assume cardiac care for the patient tomorrow, as he is her primary cardiologist. ?-Dual antiplatelet therapy with aspirin and prasugrel for at least 12 months. ?-Obtain echocardiogram to assess LVEF. ?-Aggressive secondary prevention of coronary artery disease; I will transition from pravastatin to atorvastatin 80 mg daily. ?-Postdischarge cardiac rehab referral has been placed. ?-Check UDS. ? ?Hyperlipidemia: ?-Check fasting lipid panel with a.m. labs. ?-Transition from pravastatin to atorvastatin 80 mg daily. ? ?Type 2 diabetes mellitus: ?-Hold metformin following catheterization. ?-Further management per hospitalist team. ? ?For questions or updates, please contact [facility] ?Please consult [email or web address] for contact info under [facility]. ? ?Signed, ?Yvonne Kendallhristopher Conception Doebler, MD  ?10/11/2021 6:12 PM ? ?

## 2021-10-11 NOTE — ED Notes (Signed)
Dr. End at bedside. 

## 2021-10-12 ENCOUNTER — Encounter: Payer: Self-pay | Admitting: Internal Medicine

## 2021-10-12 ENCOUNTER — Other Ambulatory Visit: Payer: Self-pay

## 2021-10-12 ENCOUNTER — Inpatient Hospital Stay
Admit: 2021-10-12 | Discharge: 2021-10-12 | Disposition: A | Discharge status: Home / Self Care | Payer: BC Managed Care – PPO | Attending: Internal Medicine | Admitting: Internal Medicine

## 2021-10-12 DIAGNOSIS — I2111 ST elevation (STEMI) myocardial infarction involving right coronary artery: Secondary | ICD-10-CM | POA: Diagnosis not present

## 2021-10-12 LAB — HEMOGLOBIN A1C
Hgb A1c MFr Bld: 6.2 % — ABNORMAL HIGH (ref 4.8–5.6)
Mean Plasma Glucose: 131.24 mg/dL

## 2021-10-12 LAB — ECHOCARDIOGRAM COMPLETE
AR max vel: 2.31 cm2
AV Area VTI: 2.43 cm2
AV Area mean vel: 2.5 cm2
AV Mean grad: 6 mmHg
AV Peak grad: 11.7 mmHg
Ao pk vel: 1.71 m/s
Area-P 1/2: 3.23 cm2
Height: 67 in
MV VTI: 2.18 cm2
S' Lateral: 3.38 cm
Weight: 4232.83 oz

## 2021-10-12 LAB — BASIC METABOLIC PANEL
Anion gap: 4 — ABNORMAL LOW (ref 5–15)
BUN: 10 mg/dL (ref 6–20)
CO2: 20 mmol/L — ABNORMAL LOW (ref 22–32)
Calcium: 8.4 mg/dL — ABNORMAL LOW (ref 8.9–10.3)
Chloride: 111 mmol/L (ref 98–111)
Creatinine, Ser: 0.89 mg/dL (ref 0.44–1.00)
GFR, Estimated: >60 mL/min (ref >60)
Glucose, Bld: 99 mg/dL (ref 70–99)
Potassium: 3.8 mmol/L (ref 3.5–5.1)
Sodium: 135 mmol/L (ref 135–145)

## 2021-10-12 LAB — CBC
HCT: 32.4 % — ABNORMAL LOW (ref 36.0–46.0)
Hemoglobin: 10.4 g/dL — ABNORMAL LOW (ref 12.0–15.0)
MCH: 24.4 pg — ABNORMAL LOW (ref 26.0–34.0)
MCHC: 32.1 g/dL (ref 30.0–36.0)
MCV: 76.1 fL — ABNORMAL LOW (ref 80.0–100.0)
Platelets: 227 10*3/uL (ref 150–400)
RBC: 4.26 MIL/uL (ref 3.87–5.11)
RDW: 16 % — ABNORMAL HIGH (ref 11.5–15.5)
WBC: 6.5 10*3/uL (ref 4.0–10.5)
nRBC: 0 % (ref 0.0–0.2)

## 2021-10-12 LAB — LIPID PANEL
Cholesterol: 157 mg/dL (ref 0–200)
HDL: 38 mg/dL — ABNORMAL LOW (ref >40)
LDL Cholesterol: 109 mg/dL — ABNORMAL HIGH (ref 0–99)
Total CHOL/HDL Ratio: 4.1 RATIO
Triglycerides: 51 mg/dL (ref <150)
VLDL: 10 mg/dL (ref 0–40)

## 2021-10-12 LAB — GLUCOSE, CAPILLARY
Glucose-Capillary: 93 mg/dL (ref 70–99)
Glucose-Capillary: 107 mg/dL — ABNORMAL HIGH (ref 70–99)

## 2021-10-12 MED ORDER — PRASUGREL HCL 10 MG PO TABS: 10.0000 mg | ORAL_TABLET | Freq: Every day | ORAL | 2 refills | Status: DC

## 2021-10-12 MED ORDER — ATORVASTATIN CALCIUM 80 MG PO TABS: 80.0000 mg | ORAL_TABLET | Freq: Every day | ORAL | 2 refills | Status: DC

## 2021-10-12 NOTE — Discharge Summary (Signed)
?Physician Discharge Summary ?  ?Patient: Sonya [name] MRN: [id number] DOB: 1981-04-22  ?Admit date:     10/11/2021  ?Discharge date: 10/12/21  ?Discharge Physician: Pennie Banter  ? ?PCP: Fayrene Helper, NP  ? ?Recommendations at discharge:  ? ? Follow up in Cardiology clinic on Monday, 10/17/21 at 10:00 am ?Follow up with PCP in 1-2 weeks ?Repeat CBC, BMP, Mg 1-2 weeks ? ?Discharge Diagnoses: ?Principal Problem: ?  STEMI (ST elevation myocardial infarction) (HCC) ?Active Problems: ?  STEMI involving right coronary artery [facility]) ? ? ?Hospital Course: ?HPI on admission:  ?"Sonya [name] is an 41 y.o. female with HTN, dyslipidemia, known CAD s/p DES 2019 and October 2022, ongoing tobacco use presented to the ED earlier today with substernal chest pressure without radiation which had been present continuously for 2 days both at rest and at exertion.  However after taking a nap today, patient woke up around 3 PM this afternoon and pain had increased significantly.  Patient had no associated shortness of breath but she did have some diaphoresis.  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 ?  ?In the ambulance patient was given aspirin 324 mg and a code stroke was called due to ST elevations inferiorly.  Patient was taken directly to the Cath Lab. Patient was found to have acute plaque rupture with thrombotic occlusion of the mid RCA which was successfully stented. She was also noted to have a 90% stenosis of distal RCA at the bifurcation which was also successfully   of note she did have a strong collateral that branched off prior to the stenotic area." ? ? ?Patient remained improved, without chest pain, tolerating ambulation without symptoms.   ? ?Post-cath Echocardiogram --  ?1. Left ventricular ejection fraction, by estimation, is 60 to 65%. The left ventricle has normal function. The left ventricle has no regional wall motion abnormalities. Left  ventricular diastolic parameters are consistent with Grade I diastolic  ?dysfunction (impaired relaxation).  ? 2. Right ventricular systolic function is normal. The right ventricular size is normal.  ? 3. The mitral valve is normal in structure. Trivial mitral valve  ?regurgitation. No evidence of mitral stenosis.  ? 4. The aortic valve is normal in structure. Aortic valve regurgitation is trivial. No aortic stenosis is present.  ? 5. The inferior vena cava is normal in size with greater than 50%  ?respiratory variability, suggesting right atrial pressure of 3 mmHg.  ? ? ?Patient was cleared for discharge with cardiology. ?Follow up scheduled in cardiology's clinic for Monday 4/24 at 10AM.   ? ? ?Assessment and Plan: ? ?CAD/STEMI ?Patient was noted to have acute plaque rupture at RCA which was successfully stented. ?Patient has not had previous decreased EF in the past ?Initial troponin and repeat EKG are pending ?Echocardiogram is ordered for tomorrow ?Follow blood pressure closely and hope that she did not significantly infarct her RV. ?Treated w prasugrel per Dr. Okey Dupre her cardiologist ?Continue ASA, carvedilol and atorvastatin 80 mg ?Continue Imdur, ranolazine for symptom management. ?  ?HTN - carvedilol, enalapril and spironolactone ? ?  ?Dyslipidemia Continue atorvastatin 80 mg ?  ?Ongoing tobacco use ?Patient is well aware that she should stop smoking tobacco, counseled again ?  ?GERD Continue PPI ? ? ?  ? ? ?Consultants: Cardiology ?Procedures performed: Left heart cath / Echocardiogram  ?Disposition: Home ?Diet recommendation:  ?Cardiac diet ?DISCHARGE MEDICATION: ?Allergies as of 10/12/2021   ? ?  Reactions  ? Penicillins Hives  ? Metoprolol Hives  ? ?  ? ?  ?Medication List  ?  ? ?STOP taking these medications   ? ?clopidogrel 75 MG tablet ?Commonly known as: PLAVIX ?  ?pravastatin 40 MG tablet ?Commonly known as: PRAVACHOL ?  ? ?  ? ?TAKE these medications   ? ?aspirin 81 MG chewable tablet ?Commonly known  as: Aspirin Childrens ?Chew 1 tablet (81 mg total) by mouth daily. ?  ?atorvastatin 80 MG tablet ?Commonly known as: LIPITOR ?Take 1 tablet (80 mg total) by mouth daily. ?  ?carvedilol 25 MG tablet ?Commonly known as: COREG ?Take 25 mg by mouth 2 (two) times daily. ?  ?enalapril 20 MG tablet ?Commonly known as: VASOTEC ?Take 20 mg by mouth daily. ?  ?ibuprofen 800 MG tablet ?Commonly known as: ADVIL ?Take 1 tablet (800 mg total) by mouth every 8 (eight) hours as needed for mild pain. ?  ?isosorbide mononitrate 120 MG 24 hr tablet ?Commonly known as: IMDUR ?Take 120 mg by mouth daily. ?  ?metFORMIN 500 MG tablet ?Commonly known as: GLUCOPHAGE ?Take 500 mg by mouth 2 (two) times daily. ?  ?nitroGLYCERIN 0.4 MG SL tablet ?Commonly known as: NITROSTAT ?Place 1 tablet (0.4 mg total) under the tongue every 5 (five) minutes as needed for chest pain. ?  ?nystatin cream ?Commonly known as: MYCOSTATIN ?Apply 1 application topically at bedtime as needed (irritation). ?  ?prasugrel 10 MG Tabs tablet ?Commonly known as: EFFIENT ?Take 1 tablet (10 mg total) by mouth daily. ?Start taking on: October 13, 2021 ?  ?Probiotic 1-250 BILLION-MG Caps ?Take 1 capsule by mouth daily. ?  ?ranolazine 1000 MG SR tablet ?Commonly known as: RANEXA ?Take 1,000 mg by mouth 2 (two) times daily. ?  ?spironolactone 25 MG tablet ?Commonly known as: ALDACTONE ?Take 25 mg by mouth daily. ?  ?Vitamin D3 125 MCG (5000 UT) Caps ?Take 5,000 Units by mouth daily. ?  ? ?  ? ? ?Discharge Exam: ?Filed Weights  ? 10/11/21 1555 10/11/21 1800  ?Weight: 121.6 kg 120 kg  ? ?General exam: awake, alert, no acute distress ?HEENT: atraumatic, clear conjunctiva, anicteric sclera, moist mucus membranes, hearing grossly normal  ?Respiratory system: CTAB, no wheezes, rales or rhonchi, normal respiratory effort. ?Cardiovascular system: normal S1/S2, RRR, no JVD, murmurs, rubs, gallops, no pedal edema.   ?Gastrointestinal system: soft, NT, ND, no HSM felt, +bowel  sounds. ?Central nervous system: A&O x4. no gross focal neurologic deficits, normal speech ?Extremities: moves all , no edema, normal tone ?Skin: dry, intact, normal temperature, normal color, No rashes, lesions or ulcers ?Psychiatry: normal mood, congruent affect, judgement and insight appear normal ? ? ?Condition at discharge: stable ? ?The results of significant diagnostics from this hospitalization (including imaging, microbiology, ancillary and laboratory) are listed below for reference.  ? ?Imaging Studies: ?CARDIAC CATHETERIZATION ? ?Result Date: 10/11/2021 ?Conclusions: Severe single-vessel coronary artery disease with thrombotic subtotal occlusion of the mid RCA secondary to acute plaque rupture leading to TIMI-1 flow.  There are also sequential 40-90% proximal and distal RCA lesions extending to the bifurcation Lucius Conn(Medina 1,1,1). Moderate, nonobstructive mid RCA disease with 50% stenosis. Patent proximal LAD stent with 20% in-stent restenosis. Widely patent mid/distal LCx stent. Mildly elevated left ventricular filling pressure (LVEDP 18 mmHg). Successful PCI to mid RCA using Onyx Frontier 2.5 x 22 mm drug-eluting stent (postdilated to 3.1 mm proximally) with 0% residual stenosis and TIMI-3 flow. Successful bifurcation PCI using provisional technique to the distal RCA using  Onyx Frontier 2.25 x 22 mm drug-eluting stent extending into the proximal RPDA with 0% residual stenosis and TIMI-3 flow. Recommendations: Dual antiplatelet therapy for at least 12 months.  Recommend switching from clopidogrel to prasugrel for more potent platelet inhibition. Aggressive secondary prevention of coronary artery disease including escalation of statin therapy. Obtain echocardiogram to assess LVEF. Continue baseline antianginal therapy, with further titration per primary cardiology team. Yvonne Kendall, MD [facility]  ? ?Microbiology: ?Results for orders placed or performed during the hospital encounter of 10/11/21  ?Resp  Panel by RT-PCR (Flu A&B, Covid) Nasopharyngeal Swab     Status: None  ? Collection Time: 10/11/21  3:58 PM  ? Specimen: Nasopharyngeal Swab; Nasopharyngeal(NP) swabs in vial transport medium  ?Result Value Ref Range Statu

## 2021-10-12 NOTE — Progress Notes (Signed)
Patient discharged home with fiance. Belongings returned to patient. After visit summary reviewed, questions answered.  ?

## 2021-10-12 NOTE — Progress Notes (Signed)
?  Transition of Care (TOC) Screening Note ? ? ?Patient Details  ?Name: Sonya [name] ?Date of Birth: Feb 15, 1981 ? ? ?Transition of Care (TOC) CM/SW Contact:    ?Shelbie Hutching, RN ?Phone Number: ?10/12/2021, 11:22 AM ? ? ? ?Transition of Care Department [facility]) has reviewed patient and no TOC needs have been identified at this time. We will continue to monitor patient advancement through interdisciplinary progression rounds. If new patient transition needs arise, please place a TOC consult. ?  ?

## 2021-10-12 NOTE — Progress Notes (Signed)
*  PRELIMINARY RESULTS* ?Echocardiogram ?2D Echocardiogram has been performed. ? ?Sonya Gula Paiten [name] ?10/12/2021, 9:10 AM ?

## 2021-10-12 NOTE — Consult Note (Signed)
? ? ?Sonya [name] is a 41 y.o. female  [id number] ? ?Primary Cardiologist: Adrian Blackwater, MD ?Reason for Consultation: STEMI ? ?HPI: Sonya [name] is an 41 y.o. female with HTN, dyslipidemia, known CAD s/p DES 2019 and October 2022, ongoing tobacco use presented to the ED earlier today with substernal chest pressure without radiation which had been present continuously for 2 days both at rest and at exertion.  However, after taking a nap today, patient woke up around 3 PM this afternoon and pain had increased significantly.  Patient had no associated shortness of breath but she did have some diaphoresis.  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 ?  ?In the ambulance patient was given aspirin 324 mg and a code stroke was called due to ST elevations inferiorly. Patient was taken directly to the Cath Lab. Patient was found to have acute plaque rupture with thrombotic occlusion of the mid RCA which was successfully stented. She was also noted to have a 90% stenosis of distal RCA at the bifurcation which was also successfully stented. Of note, she did have a strong collateral that branched off prior to the stenotic area. ? ? ?Review of Systems: Patient denies chest pain, shortness of breath.  ? ? ?Past Medical History:  ?Diagnosis Date  ? Coronary artery disease   ? GERD (gastroesophageal reflux disease)   ? H/O heart artery stent   ? Tobacco abuse   ? ? ?Medications Prior to Admission  ?Medication Sig Dispense Refill  ? aspirin (ASPIRIN CHILDRENS) 81 MG chewable tablet Chew 1 tablet (81 mg total) by mouth daily. 30 tablet 0  ? Bacillus Coagulans-Inulin (PROBIOTIC) 1-250 BILLION-MG CAPS Take 1 capsule by mouth daily. 30 capsule 0  ? carvedilol (COREG) 25 MG tablet Take 25 mg by mouth 2 (two) times daily.    ? Cholecalciferol (VITAMIN D3) 125 MCG (5000 UT) CAPS Take 5,000 Units by mouth daily.    ? clopidogrel (PLAVIX) 75 MG tablet Take 1 tablet (75 mg total) by  mouth daily. 30 tablet 3  ? enalapril (VASOTEC) 20 MG tablet Take 20 mg by mouth daily.    ? isosorbide mononitrate (IMDUR) 60 MG 24 hr tablet Take 60 mg by mouth in the morning and at bedtime.    ? metFORMIN (GLUCOPHAGE) 500 MG tablet Take 500 mg by mouth 2 (two) times daily.    ? nitroGLYCERIN (NITROSTAT) 0.4 MG SL tablet Place 1 tablet (0.4 mg total) under the tongue every 5 (five) minutes as needed for chest pain. 30 tablet 12  ? nystatin cream (MYCOSTATIN) Apply 1 application topically at bedtime as needed (irritation).    ? pravastatin (PRAVACHOL) 40 MG tablet Take 40 mg by mouth daily.    ? ranolazine (RANEXA) 1000 MG SR tablet Take 1,000 mg by mouth 2 (two) times daily.    ? spironolactone (ALDACTONE) 25 MG tablet Take 25 mg by mouth daily.    ? cetirizine (ZYRTEC) 10 MG tablet Take 10 mg by mouth at bedtime. (Patient not taking: Reported on 10/12/2021)    ? gabapentin (NEURONTIN) 300 MG capsule Take 300 mg by mouth 2 (two) times daily as needed for pain. (Patient not taking: Reported on 10/12/2021)    ? ibuprofen (ADVIL) 800 MG tablet Take 1 tablet (800 mg total) by mouth every 8 (eight) hours as needed for mild pain. 30 tablet 0  ? isosorbide mononitrate (IMDUR) 120 MG 24 hr  tablet Take 120 mg by mouth 2 (two) times daily. (Patient not taking: Reported on 10/12/2021)    ? nicotine (NICODERM CQ - DOSED IN MG/24 HOURS) 21 mg/24hr patch Place 1 patch (21 mg total) onto the skin daily. (Patient not taking: Reported on 10/12/2021) 28 patch 0  ? pantoprazole (PROTONIX) 40 MG tablet Take 40 mg by mouth 2 (two) times daily. (Patient not taking: Reported on 10/12/2021)    ? ? ? ? aspirin  81 mg Oral Daily  ? atorvastatin  80 mg Oral Daily  ? carvedilol  25 mg Oral BID WC  ? Chlorhexidine Gluconate Cloth  6 each Topical Q0600  ? enalapril  20 mg Oral Daily  ? enoxaparin (LOVENOX) injection  40 mg Subcutaneous Q24H  ? insulin aspart  0-15 Units Subcutaneous TID WC  ? isosorbide mononitrate  120 mg Oral Daily  ? loratadine   10 mg Oral QHS  ? pantoprazole  40 mg Oral BID  ? prasugrel  10 mg Oral Daily  ? ranolazine  1,000 mg Oral BID  ? sodium chloride flush  3 mL Intravenous Q12H  ? spironolactone  25 mg Oral Daily  ? ? ?Infusions: ? sodium chloride    ? ? ?Allergies  ?Allergen Reactions  ? Penicillins Hives  ? Metoprolol Hives  ? ? ?Social History  ? ?Socioeconomic History  ? Marital status: Legally Separated  ?  Spouse name: Not on file  ? Number of children: 4  ? Years of education: Not on file  ? Highest education level: Not on file  ?Occupational History  ? Not on file  ?Tobacco Use  ? Smoking status: Former  ?  Packs/day: 0.50  ?  Types: Cigarettes  ?  Quit date: 03/2021  ?  Years since quitting: 0.5  ? Smokeless tobacco: Never  ?Vaping Use  ? Vaping Use: Never used  ?Substance and Sexual Activity  ? Alcohol use: Yes  ?  Comment: occasionally  ? Drug use: Never  ? Sexual activity: Not on file  ?Other Topics Concern  ? Not on file  ?Social History Narrative  ? Lives at home with all her children  ? ?Social Determinants of Health  ? ?Financial Resource Strain: Not on file  ?Food Insecurity: Not on file  ?Transportation Needs: Not on file  ?Physical Activity: Not on file  ?Stress: Not on file  ?Social Connections: Not on file  ?Intimate Partner Violence: Not on file  ? ? ?Family History  ?Problem Relation Age of Onset  ? Heart attack Mother   ? Heart attack Father   ? ? ?PHYSICAL EXAM: ?Vitals:  ? 10/12/21 0530 10/12/21 0600  ?BP: 125/76 116/80  ?Pulse:  (!) 57  ?Resp: 17 (!) 21  ?Temp:    ?SpO2:  99%  ? ? ? ?Intake/Output Summary (Last 24 hours) at 10/12/2021 0854 ?Last data filed at 10/11/2021 1800 ?Gross per 24 hour  ?Intake 240 ml  ?Output --  ?Net 240 ml  ? ? ?General:  Well appearing. No respiratory difficulty ?HEENT: normal ?Neck: supple. no JVD. Carotids 2+ bilat; no bruits. No lymphadenopathy or thryomegaly appreciated. ?Cor: PMI nondisplaced. Regular rate & rhythm. No rubs, gallops or murmurs. ?Lungs: clear ?Abdomen: soft,  nontender, nondistended. No hepatosplenomegaly. No bruits or masses. Good bowel sounds. ?Extremities: no cyanosis, clubbing, rash, edema ?Neuro: alert & oriented x 3, cranial nerves grossly intact. moves all 4 extremities w/o difficulty. Affect pleasant. ? ?ECG: NSR, HR 65 bpm ? ?Results for orders placed  or performed during the hospital encounter of 10/11/21 (from the past 24 hour(s))  ?CBC with Differential     Status: Abnormal  ? Collection Time: 10/11/21  3:52 PM  ?Result Value Ref Range  ? WBC 8.5 4.0 - 10.5 K/uL  ? RBC 4.39 3.87 - 5.11 MIL/uL  ? Hemoglobin 10.6 (L) 12.0 - 15.0 g/dL  ? HCT 34.4 (L) 36.0 - 46.0 %  ? MCV 78.4 (L) 80.0 - 100.0 fL  ? MCH 24.1 (L) 26.0 - 34.0 pg  ? MCHC 30.8 30.0 - 36.0 g/dL  ? RDW 16.0 (H) 11.5 - 15.5 %  ? Platelets 254 150 - 400 K/uL  ? nRBC 0.0 0.0 - 0.2 %  ? Neutrophils Relative % 48 %  ? Neutro Abs 4.1 1.7 - 7.7 K/uL  ? Lymphocytes Relative 41 %  ? Lymphs Abs 3.5 0.7 - 4.0 K/uL  ? Monocytes Relative 9 %  ? Monocytes Absolute 0.8 0.1 - 1.0 K/uL  ? Eosinophils Relative 1 %  ? Eosinophils Absolute 0.1 0.0 - 0.5 K/uL  ? Basophils Relative 1 %  ? Basophils Absolute 0.0 0.0 - 0.1 K/uL  ? Immature Granulocytes 0 %  ? Abs Immature Granulocytes 0.02 0.00 - 0.07 K/uL  ?Comprehensive metabolic panel     Status: Abnormal  ? Collection Time: 10/11/21  3:52 PM  ?Result Value Ref Range  ? Sodium 134 (L) 135 - 145 mmol/L  ? Potassium 3.6 3.5 - 5.1 mmol/L  ? Chloride 107 98 - 111 mmol/L  ? CO2 19 (L) 22 - 32 mmol/L  ? Glucose, Bld 118 (H) 70 - 99 mg/dL  ? BUN 11 6 - 20 mg/dL  ? Creatinine, Ser 0.89 0.44 - 1.00 mg/dL  ? Calcium 8.9 8.9 - 10.3 mg/dL  ? Total Protein 6.9 6.5 - 8.1 g/dL  ? Albumin 3.4 (L) 3.5 - 5.0 g/dL  ? AST 18 15 - 41 U/L  ? ALT 12 0 - 44 U/L  ? Alkaline Phosphatase 49 38 - 126 U/L  ? Total Bilirubin 0.3 0.3 - 1.2 mg/dL  ? GFR, Estimated >60 >60 mL/min  ? Anion gap 8 5 - 15  ?Troponin I (High Sensitivity)     Status: Abnormal  ? Collection Time: 10/11/21  3:52 PM  ?Result Value  Ref Range  ? Troponin I (High Sensitivity) 95 (H) <18 ng/L  ?CBG monitoring, ED     Status: Abnormal  ? Collection Time: 10/11/21  3:56 PM  ?Result Value Ref Range  ? Glucose-Capillary 113 (H) 70 -

## 2021-10-31 ENCOUNTER — Encounter: Payer: BC Managed Care – PPO | Attending: Cardiovascular Disease | Admitting: *Deleted

## 2021-10-31 ENCOUNTER — Encounter: Payer: Self-pay | Admitting: *Deleted

## 2021-10-31 DIAGNOSIS — Z955 Presence of coronary angioplasty implant and graft: Secondary | ICD-10-CM

## 2021-10-31 DIAGNOSIS — I213 ST elevation (STEMI) myocardial infarction of unspecified site: Secondary | ICD-10-CM

## 2021-10-31 NOTE — Progress Notes (Signed)
Virtual orientation call completed today. shehas an appointment on Date: 11/10/2021  for EP eval and gym Orientation.  Documentation of diagnosis can be found in [facility] Date: 10/11/2021 ?Sonya [name] is a current tobacco user. Intervention for tobacco cessation was provided at the initial medical review. She was asked about readiness to quit and reported she is now vaping only, 3 days a week and is working toward a full quit. She does not have a quit date set. . Patient was advised and educated about tobacco cessation using combination therapy, tobacco cessation classes, quit line, and quit smoking apps. Patient demonstrated understanding of this material. Staff will continue to provide encouragement and follow up with the patient throughout the program.  .  ? ?

## 2021-11-06 ENCOUNTER — Emergency Department: Payer: BC Managed Care – PPO

## 2021-11-06 ENCOUNTER — Encounter: Payer: Self-pay | Admitting: Emergency Medicine

## 2021-11-06 DIAGNOSIS — E119 Type 2 diabetes mellitus without complications: Secondary | ICD-10-CM | POA: Diagnosis not present

## 2021-11-06 DIAGNOSIS — I209 Angina pectoris, unspecified: Secondary | ICD-10-CM | POA: Diagnosis not present

## 2021-11-06 DIAGNOSIS — I10 Essential (primary) hypertension: Secondary | ICD-10-CM | POA: Diagnosis not present

## 2021-11-06 DIAGNOSIS — R079 Chest pain, unspecified: Secondary | ICD-10-CM | POA: Diagnosis present

## 2021-11-06 LAB — CBC
HCT: 37.6 % (ref 36.0–46.0)
Hemoglobin: 11.7 g/dL — ABNORMAL LOW (ref 12.0–15.0)
MCH: 24.1 pg — ABNORMAL LOW (ref 26.0–34.0)
MCHC: 31.1 g/dL (ref 30.0–36.0)
MCV: 77.4 fL — ABNORMAL LOW (ref 80.0–100.0)
Platelets: 275 10*3/uL (ref 150–400)
RBC: 4.86 MIL/uL (ref 3.87–5.11)
RDW: 16.8 % — ABNORMAL HIGH (ref 11.5–15.5)
WBC: 9.8 10*3/uL (ref 4.0–10.5)
nRBC: 0 % (ref 0.0–0.2)

## 2021-11-06 NOTE — ED Triage Notes (Signed)
Pt here form home with c/o chest pain , center of her chest and left shoulder , had  2 stents placed 3 weeks ago , and has 4 stents total  ?

## 2021-11-07 ENCOUNTER — Emergency Department
Admission: EM | Admit: 2021-11-07 | Discharge: 2021-11-07 | Disposition: A | Discharge status: Home / Self Care | Payer: BC Managed Care – PPO | Attending: Emergency Medicine | Admitting: Emergency Medicine

## 2021-11-07 ENCOUNTER — Other Ambulatory Visit: Payer: Self-pay

## 2021-11-07 DIAGNOSIS — I209 Angina pectoris, unspecified: Secondary | ICD-10-CM | POA: Diagnosis not present

## 2021-11-07 DIAGNOSIS — I208 Other forms of angina pectoris: Secondary | ICD-10-CM

## 2021-11-07 LAB — BASIC METABOLIC PANEL
Anion gap: 6 (ref 5–15)
BUN: 14 mg/dL (ref 6–20)
CO2: 22 mmol/L (ref 22–32)
Calcium: 9.2 mg/dL (ref 8.9–10.3)
Chloride: 108 mmol/L (ref 98–111)
Creatinine, Ser: 1.03 mg/dL — ABNORMAL HIGH (ref 0.44–1.00)
GFR, Estimated: >60 mL/min (ref >60)
Glucose, Bld: 118 mg/dL — ABNORMAL HIGH (ref 70–99)
Potassium: 3.8 mmol/L (ref 3.5–5.1)
Sodium: 136 mmol/L (ref 135–145)

## 2021-11-07 LAB — TROPONIN I (HIGH SENSITIVITY)
Troponin I (High Sensitivity): 3 ng/L (ref <18)
Troponin I (High Sensitivity): 3 ng/L (ref <18)

## 2021-11-07 LAB — POC URINE PREG, ED: Preg Test, Ur: NEGATIVE

## 2021-11-07 NOTE — ED Notes (Signed)
Pt appears to be sleeping, NAD noted, even and unlabored RR, skin warm and dry, family at the bedside who also appears to be sleeping, lights off to room for comfort per pt request. VSS, safety in place, and call bell in reach for assistance.  ?

## 2021-11-07 NOTE — ED Provider Notes (Signed)
? ?Sonya [name] ?Provider Note ? ? ? Event Date/Time  ? First MD Initiated Contact with Patient 11/07/21 0224   ?  (approximate) ? ? ?History  ? ?Chest Pain ? ? ?HPI ? ?Sonya [name] is a 41 y.o. female with a with a hx of coronary artery disease s/p PCI's to LAD and LCx and RCA with most recent STEMI on 10/11/2021 (about 1 month ago), hypertension, hyperlipidemia, type 2 diabetes mellitus, and ongoing vaping.  She presents by EMS for evaluation of chest pain.  She said that she started to have some pain in the middle of her chest this evening.  It did not go away after nitroglycerin.  She says she has been taking all of her other medications as well including her isosorbide mononitrate.  She is not having shortness of breath.  No nausea or vomiting.  She feels better now than she did earlier.  She has some pain in in her left arm but that has been going on for couple of days.  She most recently had PCI by Dr. Okey Dupre but she identifies her cardiologist as Dr. Welton Flakes. ? ? ?  ? ? ?Physical Exam  ? ?Triage Vital Signs: ?ED Triage Vitals  ?Enc Vitals Group  ?   BP 11/06/21 2302 116/75  ?   Pulse Rate 11/06/21 2302 61  ?   Resp 11/06/21 2302 18  ?   Temp 11/06/21 2302 98.8 ?F (37.1 ?C)  ?   Temp src --   ?   SpO2 11/06/21 2302 96 %  ?   Weight 11/06/21 2300 115.7 kg (255 lb)  ?   Height 11/06/21 2300 1.803 m (5\' 11" )  ?   Head Circumference --   ?   Peak Flow --   ?   Pain Score 11/06/21 2300 5  ?   Pain Loc --   ?   Pain Edu? --   ?   Excl. in [place]? --   ? ? ?Most recent vital signs: ?Vitals:  ? 11/07/21 0215 11/07/21 0337  ?BP: 114/84 120/85  ?Pulse: 67 64  ?Resp: 19 (!) 22  ?Temp:    ?SpO2: 100% 99%  ? ? ? ?General: Awake, no distress.  ?CV:  Good peripheral perfusion.  Normal rate and rhythm.  Normal heart sounds. ?Resp:  Normal effort.  Lungs clear to auscultation bilaterally. ?Abd:  No distention.  No tenderness to palpation of the abdomen. ?Other:  Alert, oriented, calm. ? ? ?ED Results / Procedures  / Treatments  ? ?Labs ?(all labs ordered are listed, but only abnormal results are displayed) ?Labs Reviewed  ?BASIC METABOLIC PANEL - Abnormal; Notable for the following components:  ?    Result Value  ? Glucose, Bld 118 (*)   ? Creatinine, Ser 1.03 (*)   ? All other components within normal limits  ?CBC - Abnormal; Notable for the following components:  ? Hemoglobin 11.7 (*)   ? MCV 77.4 (*)   ? MCH 24.1 (*)   ? RDW 16.8 (*)   ? All other components within normal limits  ?PROTIME-INR  ?POC URINE PREG, ED  ?TROPONIN I (HIGH SENSITIVITY)  ?TROPONIN I (HIGH SENSITIVITY)  ? ? ? ?EKG ? ?ED ECG REPORT ?I05/17/23, the attending physician, personally viewed and interpreted this ECG. ? ?Date: 11/06/2021 ?EKG Time: 23: 09 ?Rate: 67 ?Rhythm: normal sinus rhythm ?QRS Axis: normal ?Intervals: normal ?ST/T Wave abnormalities: Non-specific ST segment / T-wave changes, but no clear evidence  of acute ischemia. ?Narrative Interpretation: no definitive evidence of acute ischemia; does not meet STEMI criteria. ? ?ED ECG REPORT ?ILoleta [name], the attending physician, personally viewed and interpreted this ECG. ? ?Date: 11/07/2021 ?EKG Time: 2:12 AM ?Rate: 62 ?Rhythm: normal sinus rhythm ?QRS Axis: normal ?Intervals: normal ?ST/T Wave abnormalities: Non-specific ST segment / T-wave changes, but no clear evidence of acute ischemia. ?Narrative Interpretation: no definitive evidence of acute ischemia; does not meet STEMI criteria. ? ? ? ?RADIOLOGY ?I personally viewed and interpreted the patient's two-view chest x-ray and I see no evidence of acute infection or other acute abnormality such as pulmonary edema. ? ? ? ?PROCEDURES: ? ?Critical Care performed: No ? ?Procedures ? ? ?MEDICATIONS ORDERED IN ED: ?Medications - No data to display ? ? ?IMPRESSION / MDM / ASSESSMENT AND PLAN / ED COURSE  ?I reviewed the triage vital signs and the nursing notes. ?             ?               ? ?Differential diagnosis includes, but is not  limited to, ACS, post PCI complication, Dressler syndrome, musculoskeletal strain, angina. ? ?Vital signs are stable and within normal limits.  Initial EKG interpreted by me is reassuring with no evidence of acute ischemia and not meeting STEMI criteria.  Patient's symptoms are minimal and have improved.  Labs ordered include BMP, CBC, high-sensitivity troponin x2, urine pregnancy test. ? ?Patient is certainly at high risk of life-threatening illness given her known severe coronary artery disease and prior PCI, including recent STEMI about 1 month ago.  However her EKG is reassuring at this time and her symptoms are minimal and improving.  We will observe and continue to reassess.  Of note the patient does not want to be in the [name] if she does not have to be admitted. ? ?I interpreted the patient's chest x-ray and there is no evidence of acute abnormality. ? ?Repeat EKG several hours after the initial 1 continues to show no sign of ischemia. ? ?High-sensitivity troponin is normal x2.  CBC is normal, basic metabolic panel is normal, urine pregnancy test is negative. ? ?I reassessed the patient after observation and second troponin.  She says she is feeling better and wants to go home.  I considered admission and discussed admission with her, but given that I believe this is most likely angina in the setting of known heart disease and not representative of an acute issue such as another MI, I think it is appropriate for her to be discharged and follow-up at the next available opportunity with her cardiologist.  She agrees with this plan.  I gave her strict return precautions should she develop new or worsening symptoms. ? ? ? ? ?  ? ? ?FINAL CLINICAL IMPRESSION(S) / ED DIAGNOSES  ? ?Final diagnoses:  ?Angina at rest [facility])  ? ? ? ?Rx / DC Orders  ? ?ED Discharge Orders   ? ? None  ? ?  ? ? ? ?Note:  This document was prepared using Dragon voice recognition software and may include unintentional dictation errors. ?   ?Sonya Rose, MD ?11/07/21 [phone] ? ?

## 2021-11-07 NOTE — Discharge Instructions (Signed)

## 2021-11-28 ENCOUNTER — Ambulatory Visit: Payer: BC Managed Care – PPO

## 2021-12-07 ENCOUNTER — Other Ambulatory Visit: Payer: Self-pay

## 2021-12-07 ENCOUNTER — Emergency Department: Payer: BC Managed Care – PPO

## 2021-12-07 ENCOUNTER — Observation Stay
Admission: EM | Admit: 2021-12-07 | Discharge: 2021-12-08 | Disposition: A | Discharge status: Home / Self Care | Payer: BC Managed Care – PPO | Attending: Internal Medicine | Admitting: Internal Medicine

## 2021-12-07 ENCOUNTER — Encounter: Payer: Self-pay | Admitting: Medical Oncology

## 2021-12-07 DIAGNOSIS — Z7982 Long term (current) use of aspirin: Secondary | ICD-10-CM | POA: Insufficient documentation

## 2021-12-07 DIAGNOSIS — Z87891 Personal history of nicotine dependence: Secondary | ICD-10-CM | POA: Insufficient documentation

## 2021-12-07 DIAGNOSIS — Z959 Presence of cardiac and vascular implant and graft, unspecified: Secondary | ICD-10-CM | POA: Diagnosis not present

## 2021-12-07 DIAGNOSIS — R079 Chest pain, unspecified: Secondary | ICD-10-CM | POA: Diagnosis present

## 2021-12-07 DIAGNOSIS — I2510 Atherosclerotic heart disease of native coronary artery without angina pectoris: Secondary | ICD-10-CM | POA: Diagnosis not present

## 2021-12-07 DIAGNOSIS — Z7984 Long term (current) use of oral hypoglycemic drugs: Secondary | ICD-10-CM | POA: Insufficient documentation

## 2021-12-07 DIAGNOSIS — R0789 Other chest pain: Principal | ICD-10-CM | POA: Insufficient documentation

## 2021-12-07 DIAGNOSIS — Z79899 Other long term (current) drug therapy: Secondary | ICD-10-CM | POA: Diagnosis not present

## 2021-12-07 LAB — CBC
HCT: 35 % — ABNORMAL LOW (ref 36.0–46.0)
Hemoglobin: 10.9 g/dL — ABNORMAL LOW (ref 12.0–15.0)
MCH: 24.2 pg — ABNORMAL LOW (ref 26.0–34.0)
MCHC: 31.1 g/dL (ref 30.0–36.0)
MCV: 77.6 fL — ABNORMAL LOW (ref 80.0–100.0)
Platelets: 269 10*3/uL (ref 150–400)
RBC: 4.51 MIL/uL (ref 3.87–5.11)
RDW: 17.9 % — ABNORMAL HIGH (ref 11.5–15.5)
WBC: 7.5 10*3/uL (ref 4.0–10.5)
nRBC: 0 % (ref 0.0–0.2)

## 2021-12-07 LAB — BASIC METABOLIC PANEL
Anion gap: 5 (ref 5–15)
BUN: 9 mg/dL (ref 6–20)
CO2: 25 mmol/L (ref 22–32)
Calcium: 8.8 mg/dL — ABNORMAL LOW (ref 8.9–10.3)
Chloride: 107 mmol/L (ref 98–111)
Creatinine, Ser: 0.96 mg/dL (ref 0.44–1.00)
GFR, Estimated: >60 mL/min (ref >60)
Glucose, Bld: 117 mg/dL — ABNORMAL HIGH (ref 70–99)
Potassium: 3.6 mmol/L (ref 3.5–5.1)
Sodium: 137 mmol/L (ref 135–145)

## 2021-12-07 LAB — TROPONIN I (HIGH SENSITIVITY)
Troponin I (High Sensitivity): 3 ng/L (ref <18)
Troponin I (High Sensitivity): 3 ng/L (ref <18)

## 2021-12-07 MED ORDER — ASPIRIN 81 MG PO CHEW
324.0000 mg | CHEWABLE_TABLET | Freq: Once | ORAL | Status: AC
  Administered 2021-12-07: 324 mg via ORAL
  Filled 2021-12-07: qty 4

## 2021-12-07 MED ORDER — OXYCODONE-ACETAMINOPHEN 5-325 MG PO TABS
1.0000 | ORAL_TABLET | Freq: Once | ORAL | Status: AC
  Administered 2021-12-07: 1 via ORAL
  Filled 2021-12-07: qty 1

## 2021-12-07 NOTE — ED Triage Notes (Signed)
Pt reports that she began having left sided chest pressure with pain into left side of neck and down left arm that began yesterday.Pt reports MI in April with stent placement.

## 2021-12-07 NOTE — ED Provider Notes (Signed)
[facility] Provider Note    Event Date/Time   First MD Initiated Contact with Patient 12/07/21 1759     (approximate)   History   Chest Pain   HPI  Sonya [name] is a 41 y.o. female past medical history of coronary artery disease status post MI in April of this year, GERD who presents with chest pain.  Symptoms started yesterday.  She endorses a sharp pain that is in the left side of her neck down to her shoulder and left anterior chest region.  Is constant nonexertional nonpleuritic she denies associated dyspnea nausea vomiting or diaphoresis.  Says that this feels similar to the pain that she had when she had her MI in April.  She is compliant with her aspirin and Brilinta.  Denies lower extremity edema or history of PE.    Past Medical History:  Diagnosis Date   Coronary artery disease    GERD (gastroesophageal reflux disease)    H/O heart artery stent    Tobacco abuse     Patient Active Problem List   Diagnosis Date Noted   STEMI involving right coronary artery ([place]) 10/11/2021   Unstable angina pectoris ([place]) 04/14/2021   HTN (hypertension) 04/14/2021   Coronary artery disease    HLD (hyperlipidemia)    Unstable angina ([place]) 06/08/2020   STEMI (ST elevation myocardial infarction) ([place]) 06/07/2018   GERD (gastroesophageal reflux disease) 06/07/2018   Tobacco abuse 06/07/2018     Physical Exam  Triage Vital Signs: ED Triage Vitals  Enc Vitals Group     BP 12/07/21 1741 109/72     Pulse Rate 12/07/21 1741 68     Resp 12/07/21 1741 19     Temp 12/07/21 1741 99.2 F (37.3 C)     Temp Source 12/07/21 1741 Oral     SpO2 12/07/21 1741 100 %     Weight 12/07/21 1744 254 lb (115.2 kg)     Height 12/07/21 1744 5\' 11"  (1.803 m)     Head Circumference --      Peak Flow --      Pain Score 12/07/21 1744 8     Pain Loc --      Pain Edu? --      Excl. in [place]? --     Most recent vital signs: Vitals:   12/07/21 1830 12/07/21 1900  BP:  100/60 97/61  Pulse: 65 61  Resp: (!) 21 (!) 23  Temp:    SpO2: 99% 100%     General: Awake, no distress.  CV:  Good peripheral perfusion.  No peripheral edema Resp:  Normal effort.  Abd:  No distention.  Neuro:             Awake, Alert, Oriented x 3  Other:  Mild tenderness along the left paraspinal cervical region no chest wall tenderness   ED Results / Procedures / Treatments  Labs (all labs ordered are listed, but only abnormal results are displayed) Labs Reviewed  BASIC METABOLIC PANEL - Abnormal; Notable for the following components:      Result Value   Glucose, Bld 117 (*)    Calcium 8.8 (*)    All other components within normal limits  CBC - Abnormal; Notable for the following components:   Hemoglobin 10.9 (*)    HCT 35.0 (*)    MCV 77.6 (*)    MCH 24.2 (*)    RDW 17.9 (*)    All other components within normal limits  POC URINE PREG, ED  TROPONIN I (HIGH SENSITIVITY)  TROPONIN I (HIGH SENSITIVITY)     EKG  EKG reviewed and interpreted by myself shows normal sinus rhythm with normal axis normal intervals there are T wave inversions in the inferior leads and V3 through V6 appears similar to prior EKG done 1 month ago   RADIOLOGY I reviewed and interpreted the CXR which does not show any acute cardiopulmonary process    PROCEDURES:  Critical Care performed: No  .1-3 Lead EKG Interpretation  Performed by: Rada Hay, MD Authorized by: Rada Hay, MD     Interpretation: normal     ECG rate assessment: normal     Rhythm: sinus rhythm     Ectopy: none     Conduction: normal     The patient is on the cardiac monitor to evaluate for evidence of arrhythmia and/or significant heart rate changes.   MEDICATIONS ORDERED IN ED: Medications  aspirin chewable tablet 324 mg (has no administration in time range)  oxyCODONE-acetaminophen (PERCOCET/ROXICET) 5-325 MG per tablet 1 tablet (1 tablet Oral Given 12/07/21 1935)     IMPRESSION / MDM  / ASSESSMENT AND PLAN / ED COURSE  I reviewed the triage vital signs and the nursing notes.                              Patient's presentation is most consistent with acute presentation with potential threat to life or bodily function.  Differential diagnosis includes, but is not limited to, unstable angina, NSTEMI, cervical radiculopathy, musculoskeletal pain   The patient is a 41 year old female with significant history of prior STEMI in April presents with chest pain.  Symptoms have been going on since yesterday are constant she endorses pain in the left neck that radiates down to the left arm shoulder and left chest.  Says that this feels similar to when she had her MI.  Blood pressure is mildly low vitals otherwise within normal limits.  She appears well she has mild tenderness over the left neck no chest wall tenderness.  EKG has T wave inversions in the inferior and lateral precordial leads but this appears similar to prior EKG done 1 month ago.  Her initial troponin is negative will repeat.  I am concerned that patient says pain is similar to when she had the MI and did recommend admission for observation and potential stress test if pain is ongoing despite negative troponins.  Patient initially did not want to be admitted so I said that we would check another troponin and repeat EKG in 2 hours.  On repeat troponin is negative repeat EKG however shows some resolution of the T wave inversions that were present before which to me is concerning given her pain is also improved at this time that these were dynamic changes.  I did recommend that the patient stay for admission for observation potentially stress test and she is agreeable to admission.  We will give aspirin.  She is pain-free currently no indication for heparin.         FINAL CLINICAL IMPRESSION(S) / ED DIAGNOSES   Final diagnoses:  Chest pain, unspecified type     Rx / DC Orders   ED Discharge Orders     None         Note:  This document was prepared using Dragon voice recognition software and may include unintentional dictation errors.   Rada Hay, MD  12/07/21 2148  

## 2021-12-07 NOTE — H&P (Signed)
[facility] - Miguel Barrera @ [facility] Admission History and Physical [employer], D.O.  ---------------------------------------------------------------------------------------------------------------------   PATIENT NAMEDelenn [name] MR#: [id number] DATE OF BIRTH: [birth year] DATE OF ADMISSION: 12/07/2021 PRIMARY CARE PHYSICIAN: Fayrene Helper, NP  REQUESTING/REFERRING PHYSICIAN: ED Dr. Sidney Ace  CHIEF COMPLAINT: Chief Complaint  Patient presents with   Chest Pain    HISTORY OF PRESENT ILLNESS: Sonya [name] is a 41 y.o. female with a known history of CAD s/p MI with stents in April 2023, GERD presents to the emergency department for evaluation of chest pain.  Patient was in a usual state of health until one day prior to arrival when she developed left neck, shoulder and chest pain at rest left arm tingling,  but not associated with SOB, diaphoresis, N/V. Symptoms are similar to her MI in April. Patient's cardiologist is Dr. Welton Flakes, and she has been compliant with her meds (ASA and Brilinta).  Pain is resolved in the ER.  She has been under increased stress recently.   Otherwise there has been no change in status. Patient has been taking medication as prescribed and there has been no recent change in medication or diet.  There has been no recent illness, travel or sick contacts.    Patient denies fevers/chills, weakness, dizziness, shortness of breath, N/V/C/D, abdominal pain, dysuria/frequency, changes in mental status.   EMS/ED COURSE:   Patient received aspirin 324, Percocet 5/325. Medical admission was requested for further evaluation of chest pain. Marland Kitchen  PAST MEDICAL HISTORY: Past Medical History:  Diagnosis Date   Coronary artery disease    GERD (gastroesophageal reflux disease)    H/O heart artery stent    Tobacco abuse       PAST SURGICAL HISTORY: Past Surgical History:  Procedure Laterality Date   CORONARY STENT INTERVENTION N/A 06/10/2018   Procedure: CORONARY  STENT INTERVENTION;  Surgeon: Alwyn Pea, MD;  Location: [facility] INVASIVE CV LAB;  Service: Cardiovascular;  Laterality: N/A;   CORONARY STENT INTERVENTION N/A 04/14/2021   Procedure: CORONARY STENT INTERVENTION;  Surgeon: Marcina Millard, MD;  Location: [facility] INVASIVE CV LAB;  Service: Cardiovascular;  Laterality: N/A;   CORONARY/GRAFT ACUTE MI REVASCULARIZATION N/A 10/11/2021   Procedure: Coronary/Graft Acute MI Revascularization;  Surgeon: Yvonne Kendall, MD;  Location: [facility] INVASIVE CV LAB;  Service: Cardiovascular;  Laterality: N/A;   LEFT HEART CATH AND CORONARY ANGIOGRAPHY N/A 06/10/2018   Procedure: With coronary intervention and stenting;  Surgeon: Laurier Nancy, MD;  Location: [facility] INVASIVE CV LAB;  Service: Cardiovascular;  Laterality: N/A;   LEFT HEART CATH AND CORONARY ANGIOGRAPHY Left 06/15/2020   Procedure: LEFT HEART CATH AND CORONARY ANGIOGRAPHY;  Surgeon: Laurier Nancy, MD;  Location: [facility] INVASIVE CV LAB;  Service: Cardiovascular;  Laterality: Left;   LEFT HEART CATH AND CORONARY ANGIOGRAPHY N/A 04/14/2021   Procedure: LEFT HEART CATH AND CORONARY ANGIOGRAPHY with intervention;  Surgeon: Laurier Nancy, MD;  Location: [facility] INVASIVE CV LAB;  Service: Cardiovascular;  Laterality: N/A;   LEFT HEART CATH AND CORONARY ANGIOGRAPHY N/A 10/11/2021   Procedure: LEFT HEART CATH AND CORONARY ANGIOGRAPHY;  Surgeon: Yvonne Kendall, MD;  Location: [facility] INVASIVE CV LAB;  Service: Cardiovascular;  Laterality: N/A;   TUBAL LIGATION        SOCIAL HISTORY: Social History   Tobacco Use   Smoking status: Former    Packs/day: 0.50    Years: 20.00    Total pack years: 10.00    Types: Cigarettes    Quit date: 03/2021  Years since quitting: 0.7   Smokeless tobacco: Never  Substance Use Topics   Alcohol use: Yes    Comment: occasionally      FAMILY HISTORY: Family History  Problem Relation Age of Onset   Heart attack Mother    Heart attack Father      MEDICATIONS  AT HOME: Prior to Admission medications   Medication Sig Start Date End Date Taking? Authorizing Provider  aspirin (ASPIRIN CHILDRENS) 81 MG chewable tablet Chew 1 tablet (81 mg total) by mouth daily. 06/11/18  Yes Wieting, Richard, MD  atorvastatin (LIPITOR) 80 MG tablet Take 1 tablet (80 mg total) by mouth daily. 10/12/21  Yes Esaw GrandchildGriffith, Kelly A, DO  Bacillus Coagulans-Inulin (PROBIOTIC) 1-250 BILLION-MG CAPS Take 1 capsule by mouth daily. 11/05/20  Yes Ward, Layla MawKristen N, DO  carvedilol (COREG) 25 MG tablet Take 25 mg by mouth 2 (two) times daily. 05/26/20  Yes [provider]  Cholecalciferol (VITAMIN D3) 125 MCG (5000 UT) CAPS Take 5,000 Units by mouth daily.   Yes [provider]  enalapril (VASOTEC) 20 MG tablet Take 20 mg by mouth daily. 06/08/20  Yes [provider]  ibuprofen (ADVIL) 800 MG tablet Take 1 tablet (800 mg total) by mouth every 8 (eight) hours as needed for mild pain. 11/05/20  Yes Ward, Layla MawKristen N, DO  isosorbide mononitrate (IMDUR) 120 MG 24 hr tablet Take 120 mg by mouth daily. 07/27/21  Yes [provider]  nitroGLYCERIN (NITROSTAT) 0.4 MG SL tablet Place 1 tablet (0.4 mg total) under the tongue every 5 (five) minutes as needed for chest pain. 04/15/21  Yes Enedina FinnerPatel, Sona, MD  nystatin cream (MYCOSTATIN) Apply 1 application topically at bedtime as needed (irritation). 04/01/21  Yes [provider]  prasugrel (EFFIENT) 10 MG TABS tablet Take 1 tablet (10 mg total) by mouth daily. 10/13/21  Yes Pennie BanterGriffith, Kelly A, DO  ranolazine (RANEXA) 1000 MG SR tablet Take 1,000 mg by mouth 2 (two) times daily. 06/08/20  Yes [provider]  spironolactone (ALDACTONE) 25 MG tablet Take 25 mg by mouth daily. 05/26/20  Yes [provider]  busPIRone (BUSPAR) 10 MG tablet Take 10 mg by mouth 2 (two) times daily as needed. 11/18/21   [provider]  metFORMIN (GLUCOPHAGE) 500 MG tablet Take 500 mg by mouth 2 (two) times daily. Patient  not taking: Reported on 12/07/2021 07/25/21   [provider]  PRALUENT 75 MG/ML SOAJ Inject 75 mg into the skin every 14 (fourteen) days. Patient not taking: Reported on 12/07/2021 10/25/21   [provider]      DRUG ALLERGIES: Allergies  Allergen Reactions   Penicillins Hives   Metoprolol Hives     REVIEW OF SYSTEMS: CONSTITUTIONAL: No fatigue, weakness, fever, chills, weight gain/loss, headache EYES: No blurry or double vision. ENT: No tinnitus, postnasal drip, redness or soreness of the oropharynx. RESPIRATORY: No dyspnea, cough, wheeze, hemoptysis. CARDIOVASCULAR: Positive chest pain, negative orthopnea, palpitations, syncope. GASTROINTESTINAL: No nausea, vomiting, constipation, diarrhea, abdominal pain. No hematemesis, melena or hematochezia. GENITOURINARY: No dysuria, frequency, hematuria. ENDOCRINE: No polyuria or nocturia. No heat or cold intolerance. HEMATOLOGY: No anemia, bruising, bleeding. INTEGUMENTARY: No rashes, ulcers, lesions. MUSCULOSKELETAL: No pain, arthritis, swelling, gout. NEUROLOGIC: No numbness, tingling, weakness or ataxia. No seizure-type activity. PSYCHIATRIC: No anxiety, depression, insomnia.  PHYSICAL EXAMINATION: VITAL SIGNS: Blood pressure 104/72, pulse 60, temperature 99.2 F (37.3 C), temperature source Oral, resp. rate (!) 22, height 5\' 11"  (1.803 m), weight 115.2 kg, last menstrual period 12/02/2021, SpO2  97 %.  GENERAL: 41 y.o.-year-old black female patient, well-developed, well-nourished lying in the bed in no acute distress.  Pleasant and cooperative.   HEENT: Head atraumatic, normocephalic. Pupils equal, round, reactive to light and accommodation. No scleral icterus. Extraocular muscles intact. Oropharynx is clear. Mucus membranes moist. NECK: Supple, full range of motion. No JVD, no bruit heard. No cervical lymphadenopathy. CHEST: Normal breath sounds bilaterally. No wheezing, rales, rhonchi or crackles. No use of accessory  muscles of respiration.  No reproducible chest wall tenderness.  CARDIOVASCULAR: S1, S2 normal. No murmurs, rubs, or gallops appreciated. Cap refill <2 seconds. ABDOMEN: Soft, nontender, nondistended. No rebound, guarding, rigidity. Normoactive bowel sounds present in all four quadrants. No organomegaly or mass. EXTREMITIES: Full range of motion. No pedal edema, cyanosis, or clubbing. NEUROLOGIC: Cranial nerves II through XII are grossly intact with no focal sensorimotor deficit. Muscle strength 5/5 in all extremities. Sensation intact. Gait not checked. PSYCHIATRIC: The patient is alert and oriented x 3. Normal affect, mood, thought content. SKIN: Warm, dry, and intact without obvious rash, lesion, or ulcer.  LABORATORY PANEL:  CBC Recent Labs  Lab 12/07/21 1745  WBC 7.5  HGB 10.9*  HCT 35.0*  PLT 269   ----------------------------------------------------------------------------------------------------------------- Chemistries Recent Labs  Lab 12/07/21 1745  NA 137  K 3.6  CL 107  CO2 25  GLUCOSE 117*  BUN 9  CREATININE 0.96  CALCIUM 8.8*   ------------------------------------------------------------------------------------------------------------------ Cardiac Enzymes No results for input(s): "TROPONINI" in the last 168 hours. ------------------------------------------------------------------------------------------------------------------  RADIOLOGY: DG Chest 2 View  Result Date: 12/07/2021 CLINICAL DATA:  Chest pain EXAM: CHEST - 2 VIEW COMPARISON:  11/06/2021 FINDINGS: The heart size and mediastinal contours are within normal limits. Both lungs are clear. The visualized skeletal structures are unremarkable. IMPRESSION: No acute abnormality of the lungs. Electronically Signed   By: Jearld Lesch M.D.   On: 12/07/2021 18:33    EKG: Normal sinus rhythm with normal axis and TWI in V3-6  IMPRESSION AND PLAN:  This is a 41 y.o. female with a history of CAD s/p MI with  stents in April 2023, GERD  now being admitted with: 1. Chest pain, rule out ACS - Admit to observation with telemetry monitoring. - Trend troponins, check lipids and TSH. - Continue aspirin, lipitor, coreg, Vasotec, Imdur, nitro, Ranexa, spironolactone - Cardiology consult requested - Dr. Welton Flakes notified.   Admission status: Observation, telemetry Diet/Nutrition: Heart healthy Fluids: HL DVT Px: SCDs and early ambulation Code Status: Full Disposition Plan: To home in <24 hours  All the records are reviewed and case discussed with ED provider. Management plans discussed with the patient and/or family who express understanding and agree with plan of care.   TOTAL TIME TAKING CARE OF THIS PATIENT: 60 minutes.   Jowanna Loeffler D.O. on 12/07/2021 at 10:43 PM Between 7am to 6pm - Pager - [phone] After 6pm go to [email or web address] - Biomedical engineer [facility] Office [phone] CC: Primary care physician; Fayrene Helper, NP     Note: This dictation was prepared with Dragon dictation along with smaller phrase technology. Any transcriptional errors that result from this process are unintentional.

## 2021-12-08 ENCOUNTER — Other Ambulatory Visit: Payer: Self-pay

## 2021-12-08 DIAGNOSIS — R079 Chest pain, unspecified: Secondary | ICD-10-CM | POA: Diagnosis not present

## 2021-12-08 NOTE — Progress Notes (Signed)
[facility] PROGRESS NOTE    Progress Note  Sonya [name]  YKD:[id number] DOB: 1981-02-20 DOA: 12/07/2021 PCP: Fayrene Helper, NP     Brief Narrative:   Sonya [name] is an 41 y.o. female with a known history of CAD status post MI with stent in 2023 comes into the emergency room for chest pain she denies any shortness of breath, she relates she has been compliant with aspirin and Brilinta pain resolved in the ED  Assessment/Plan:   Chest pain, rule out acute myocardial infarction: Dr. Welton Flakes or cardiology has been notified. She was continued on aspirin, Brilinta, Lipitor, Coreg, Vasotec, Imdur, Ranexa and nitro glycerin as needed. Cardiac biomarkers have basically remained flat. Twelve-lead EKG showed T wave inversions in the inferior lateral lead, they seem more related to LVH. Pregnancy test negative TSH is pending. Neck pain reproducible by palpation. Atypical chest pain    DVT prophylaxis: lovenox Family Communication:none Status is: Observation The patient remains OBS appropriate and will d/c before 2 midnights.    Code Status:  Code Status History     Date Active Date Inactive Code Status Order ID Comments User Context   10/11/2021 1910 10/12/2021 1650 Full Code [id number]  Pieter Partridge, MD Inpatient   10/11/2021 1826 10/11/2021 1910 Full Code [id number]  Yvonne Kendall, MD Inpatient   04/14/2021 1058 04/15/2021 1726 Full Code [id number]  Laurier Nancy, MD Inpatient   04/14/2021 1000 04/14/2021 1058 Full Code [id number]  Lorretta Harp, MD Inpatient   06/15/2020 1311 06/15/2020 1910 Full Code [id number]  Laurier Nancy, MD Inpatient   06/10/2018 1836 06/11/2018 1319 Full Code [id number]  Alwyn Pea, MD Inpatient   06/08/2018 0038 06/10/2018 1836 Full Code [id number]  Oralia Manis, MD Inpatient         IV Access:   Peripheral IV   Procedures and diagnostic studies:   DG Chest 2 View  Result Date: 12/07/2021 CLINICAL DATA:  Chest  pain EXAM: CHEST - 2 VIEW COMPARISON:  11/06/2021 FINDINGS: The heart size and mediastinal contours are within normal limits. Both lungs are clear. The visualized skeletal structures are unremarkable. IMPRESSION: No acute abnormality of the lungs. Electronically Signed   By: Jearld Lesch M.D.   On: 12/07/2021 18:33     Medical Consultants:   None.   Subjective:    Sonya [name] no chest pain, basically she has more neck pain  Objective:    Vitals:   12/07/21 1900 12/07/21 2140 12/08/21 0015 12/08/21 0400  BP: 97/61 104/72 111/78 109/75  Pulse: 61 60 63 (!) 52  Resp: (!) 23 (!) 22 17 16   Temp:    98.7 F (37.1 C)  TempSrc:    Oral  SpO2: 100% 97% 100% 100%  Weight:      Height:       SpO2: 100 %  No intake or output data in the 24 hours ending 12/08/21 0636 Filed Weights   12/07/21 1744  Weight: 115.2 kg    Exam: General exam: In no acute distress. Respiratory system: Good air movement and clear to auscultation. Cardiovascular system: S1 & S2 heard, RRR. No JVD. Gastrointestinal system: Abdomen is nondistended, soft and nontender.  Extremities: No pedal edema. Skin: No rashes, lesions or ulcers Psychiatry: Judgement and insight appear normal. Mood & affect appropriate.    Data Reviewed:    Labs: Basic Metabolic Panel: Recent Labs  Lab 12/07/21 1745  NA 137  K 3.6  CL 107  CO2 25  GLUCOSE 117*  BUN 9  CREATININE 0.96  CALCIUM 8.8*   GFR Estimated Creatinine Clearance: 107.9 mL/min (by C-G formula based on SCr of 0.96 mg/dL). Liver Function Tests: No results for input(s): "AST", "ALT", "ALKPHOS", "BILITOT", "PROT", "ALBUMIN" in the last 168 hours. No results for input(s): "LIPASE", "AMYLASE" in the last 168 hours. No results for input(s): "AMMONIA" in the last 168 hours. Coagulation profile No results for input(s): "INR", "PROTIME" in the last 168 hours. COVID-19 Labs  No results for input(s): "DDIMER", "FERRITIN", "LDH", "CRP" in the last 72  hours.  Lab Results  Component Value Date   SARSCOV2NAA NEGATIVE 10/11/2021   SARSCOV2NAA NEGATIVE 04/14/2021   SARSCOV2NAA NEGATIVE 06/11/2020    CBC: Recent Labs  Lab 12/07/21 1745  WBC 7.5  HGB 10.9*  HCT 35.0*  MCV 77.6*  PLT 269   Cardiac Enzymes: No results for input(s): "CKTOTAL", "CKMB", "CKMBINDEX", "TROPONINI" in the last 168 hours. BNP (last 3 results) No results for input(s): "PROBNP" in the last 8760 hours. CBG: No results for input(s): "GLUCAP" in the last 168 hours. D-Dimer: No results for input(s): "DDIMER" in the last 72 hours. Hgb A1c: No results for input(s): "HGBA1C" in the last 72 hours. Lipid Profile: No results for input(s): "CHOL", "HDL", "LDLCALC", "TRIG", "CHOLHDL", "LDLDIRECT" in the last 72 hours. Thyroid function studies: No results for input(s): "TSH", "T4TOTAL", "T3FREE", "THYROIDAB" in the last 72 hours.  Invalid input(s): "FREET3" Anemia work up: No results for input(s): "VITAMINB12", "FOLATE", "FERRITIN", "TIBC", "IRON", "RETICCTPCT" in the last 72 hours. Sepsis Labs: Recent Labs  Lab 12/07/21 1745  WBC 7.5   Microbiology No results found for this or any previous visit (from the past 240 hour(s)).   Medications:    Continuous Infusions:    LOS: 0 days   Marinda Elk  [facility]  12/08/2021, 6:36 AM

## 2021-12-08 NOTE — Consult Note (Signed)
Sonya Sonya [name] is Sonya Sonya [name] y.o. female  [id number]  Primary Cardiologist: Adrian Blackwater Reason for Consultation: Chest pain  HPI: This is Sonya Sonya [name] year old African-American female with history of coronary artery disease presented to the hospital with chest pain intermittently which has resolved now.   Review of Systems: No orthopnea PND or leg swelling   Past Medical History:  Diagnosis Date   Coronary artery disease    GERD (gastroesophageal reflux disease)    H/O heart artery stent    Tobacco abuse     (Not in Sonya [name] hospital admission)      Infusions:   Allergies  Allergen Reactions   Penicillins Hives   Metoprolol Hives    Social History   Socioeconomic History   Marital status: Legally Separated    Spouse name: Not on file   Number of children: 4   Years of education: Not on file   Highest education level: Not on file  Occupational History   Not on file  Tobacco Use   Smoking status: Former    Packs/day: 0.50    Years: 20.00    Total pack years: 10.00    Types: Cigarettes    Quit date: 03/2021    Years since quitting: 0.7   Smokeless tobacco: Never  Vaping Use   Vaping Use: Some days  Substance and Sexual Activity   Alcohol use: Yes    Comment: occasionally   Drug use: Never   Sexual activity: Not on file  Other Topics Concern   Not on file  Social History Narrative   Lives at home with all her children   Social Determinants of Health   Financial Resource Strain: Not on file  Food Insecurity: Not on file  Transportation Needs: Not on file  Physical Activity: Not on file  Stress: Not on file  Social Connections: Not on file  Intimate Partner Violence: Not on file    Family History  Problem Relation Age of Onset   Heart attack Mother    Heart attack Father     PHYSICAL EXAM: Vitals:   12/08/21 0700 12/08/21 0730  BP:  110/[name]  Pulse: (!) 54 61  Resp: (!) 22 19  Temp:    SpO2: 98% 100%    No intake or output data in the 24 hours  ending 12/08/21 0918  General:  Well appearing. No respiratory difficulty HEENT: normal Neck: supple. no JVD. Carotids 2+ bilat; no bruits. No lymphadenopathy or thryomegaly appreciated. Cor: PMI nondisplaced. Regular rate & rhythm. No rubs, gallops or murmurs. Lungs: clear Abdomen: soft, nontender, nondistended. No hepatosplenomegaly. No bruits or masses. Good bowel sounds. Extremities: no cyanosis, clubbing, rash, edema Neuro: alert & oriented x 3, cranial nerves grossly intact. moves all 4 extremities w/o difficulty. Affect pleasant.  ECG: Normal sinus rhythm no acute changes  Results for orders placed or performed during the hospital encounter of 12/07/21 (from the past 24 hour(s))  Basic metabolic panel     Status: Abnormal   Collection Time: 12/07/21  5:45 PM  Result Value Ref Range   Sodium 137 135 - 145 mmol/L   Potassium 3.6 3.5 - 5.1 mmol/L   Chloride 107 98 - 111 mmol/L   CO2 25 22 - 32 mmol/L   Glucose, Bld 117 (H) 70 - 99 mg/dL   BUN 9 6 - 20 mg/dL   Creatinine, Ser 7.61 0.44 - 1.00 mg/dL   Calcium 8.8 (L) 8.9 - 10.3 mg/dL   GFR, Estimated >60 >73  mL/min   Anion gap 5 5 - 15  CBC     Status: Abnormal   Collection Time: 12/07/21  5:45 PM  Result Value Ref Range   WBC 7.5 4.0 - 10.5 K/uL   RBC 4.51 3.87 - 5.11 MIL/uL   Hemoglobin 10.9 (L) 12.0 - 15.0 g/dL   HCT 44.9 (L) 67.5 - 91.6 %   MCV 77.6 (L) 80.0 - 100.0 fL   MCH 24.2 (L) 26.0 - 34.0 pg   MCHC 31.1 30.0 - 36.0 g/dL   RDW 38.4 (H) 66.5 - 99.3 %   Platelets 269 150 - 400 K/uL   nRBC 0.0 0.0 - 0.2 %  Troponin I (High Sensitivity)     Status: None   Collection Time: 12/07/21  5:45 PM  Result Value Ref Range   Troponin I (High Sensitivity) 3 <18 ng/L  Troponin I (High Sensitivity)     Status: None   Collection Time: 12/07/21  7:46 PM  Result Value Ref Range   Troponin I (High Sensitivity) 3 <18 ng/L   DG Chest 2 View  Result Date: 12/07/2021 CLINICAL DATA:  Chest pain EXAM: CHEST - 2 VIEW COMPARISON:   11/06/2021 FINDINGS: The heart size and mediastinal contours are within normal limits. Both lungs are clear. The visualized skeletal structures are unremarkable. IMPRESSION: No acute abnormality of the lungs. Electronically Signed   By: Jearld Lesch M.D.   On: 12/07/2021 18:33     ASSESSMENT AND PLAN: Atypical chest pain with MI being ruled out.  Patient can have breakfast and then go home with Sonya [name] follow-up next Tuesday at 9 AM.  I explained to her to double up the dosage of isosorbide to twice Sonya [name] day.  She can follow-up with me in the office on Tuesday at 9 AM and patient can be discharged.  Sonya Sonya [name]

## 2021-12-08 NOTE — Progress Notes (Signed)
Admission profile updated. ?

## 2021-12-08 NOTE — Discharge Summary (Signed)
Physician Discharge Summary  Sonya [name] ZOX:[id number] DOB: 06-04-1981 DOA: 12/07/2021  PCP: Fayrene Helper, NP  Admit date: 12/07/2021 Discharge date: 12/08/2021  Admitted From: Home Disposition:  home  Recommendations for Outpatient Follow-up:  Follow up with  cardiology in 2 weeks weeks Please obtain BMP/CBC in one week   Home Health:No Equipment/Devices:None  Discharge Condition:Stable CODE STATUS:Full Diet recommendation: Heart Healthy  Brief/Interim Summary: 41 y.o. female with a known history of CAD status post MI with stent in 2023 comes into the emergency room for chest pain she denies any shortness of breath, she relates she has been compliant with aspirin and Brilinta pain resolved in the ED  Discharge Diagnoses:  Principal Problem:   Chest pain, rule out acute myocardial infarction  Atypical chest pain, rule out cardiac infarction: She was continue on aspirin, Brilinta, Lipitor, Coreg, Vasotec, Imdur, Ranexa and nitroglycerin as needed. Her cardiac biomarkers have basically remained flat twelve-lead EKG showed no signs of ischemia. Pregnancy test was negative. She relates more neck pain that is reproducible by palpation. Cardiology was consulted recommended to follow-up with their office as an outpatient in 1 week. No changes made to her medication.  Discharge Instructions  Discharge Instructions     Diet - low sodium heart healthy   Complete by: As directed    Increase activity slowly   Complete by: As directed       Allergies as of 12/08/2021       Reactions   Penicillins Hives   Metoprolol Hives        Medication List     TAKE these medications    aspirin 81 MG chewable tablet Commonly known as: Aspirin Childrens Chew 1 tablet (81 mg total) by mouth daily.   atorvastatin 80 MG tablet Commonly known as: LIPITOR Take 1 tablet (80 mg total) by mouth daily.   busPIRone 10 MG tablet Commonly known as: BUSPAR Take 10 mg by mouth 2  (two) times daily as needed.   carvedilol 25 MG tablet Commonly known as: COREG Take 25 mg by mouth 2 (two) times daily.   enalapril 20 MG tablet Commonly known as: VASOTEC Take 20 mg by mouth daily.   ibuprofen 800 MG tablet Commonly known as: ADVIL Take 1 tablet (800 mg total) by mouth every 8 (eight) hours as needed for mild pain.   isosorbide mononitrate 120 MG 24 hr tablet Commonly known as: IMDUR Take 120 mg by mouth daily.   metFORMIN 500 MG tablet Commonly known as: GLUCOPHAGE Take 500 mg by mouth 2 (two) times daily.   nitroGLYCERIN 0.4 MG SL tablet Commonly known as: NITROSTAT Place 1 tablet (0.4 mg total) under the tongue every 5 (five) minutes as needed for chest pain.   nystatin cream Commonly known as: MYCOSTATIN Apply 1 application topically at bedtime as needed (irritation).   Praluent 75 MG/ML Soaj Generic drug: Alirocumab Inject 75 mg into the skin every 14 (fourteen) days.   prasugrel 10 MG Tabs tablet Commonly known as: EFFIENT Take 1 tablet (10 mg total) by mouth daily.   Probiotic 1-250 BILLION-MG Caps Take 1 capsule by mouth daily.   ranolazine 1000 MG SR tablet Commonly known as: RANEXA Take 1,000 mg by mouth 2 (two) times daily.   spironolactone 25 MG tablet Commonly known as: ALDACTONE Take 25 mg by mouth daily.   Vitamin D3 125 MCG (5000 UT) Caps Take 5,000 Units by mouth daily.        Allergies  Allergen Reactions  Penicillins Hives   Metoprolol Hives    Consultations: Cardiology   Procedures/Studies: DG Chest 2 View  Result Date: 12/07/2021 CLINICAL DATA:  Chest pain EXAM: CHEST - 2 VIEW COMPARISON:  11/06/2021 FINDINGS: The heart size and mediastinal contours are within normal limits. Both lungs are clear. The visualized skeletal structures are unremarkable. IMPRESSION: No acute abnormality of the lungs. Electronically Signed   By: Jearld Lesch M.D.   On: 12/07/2021 18:33   (Echo, Carotid, EGD, Colonoscopy, ERCP)     Subjective: No complaints  Discharge Exam: Vitals:   12/08/21 0700 12/08/21 0730  BP:  110/82  Pulse: (!) 54 61  Resp: (!) 22 19  Temp:    SpO2: 98% 100%   Vitals:   12/08/21 0015 12/08/21 0400 12/08/21 0700 12/08/21 0730  BP: 111/78 109/75  110/82  Pulse: 63 (!) 52 (!) 54 61  Resp: 17 16 (!) 22 19  Temp:  98.7 F (37.1 C)    TempSrc:  Oral    SpO2: 100% 100% 98% 100%  Weight:      Height:        General: Pt is alert, awake, not in acute distress Cardiovascular: RRR, S1/S2 +, no rubs, no gallops Respiratory: CTA bilaterally, no wheezing, no rhonchi Abdominal: Soft, NT, ND, bowel sounds + Extremities: no edema, no cyanosis    The results of significant diagnostics from this hospitalization (including imaging, microbiology, ancillary and laboratory) are listed below for reference.     Microbiology: No results found for this or any previous visit (from the past 240 hour(s)).   Labs: BNP (last 3 results) No results for input(s): "BNP" in the last 8760 hours. Basic Metabolic Panel: Recent Labs  Lab 12/07/21 1745  NA 137  K 3.6  CL 107  CO2 25  GLUCOSE 117*  BUN 9  CREATININE 0.96  CALCIUM 8.8*   Liver Function Tests: No results for input(s): "AST", "ALT", "ALKPHOS", "BILITOT", "PROT", "ALBUMIN" in the last 168 hours. No results for input(s): "LIPASE", "AMYLASE" in the last 168 hours. No results for input(s): "AMMONIA" in the last 168 hours. CBC: Recent Labs  Lab 12/07/21 1745  WBC 7.5  HGB 10.9*  HCT 35.0*  MCV 77.6*  PLT 269   Cardiac Enzymes: No results for input(s): "CKTOTAL", "CKMB", "CKMBINDEX", "TROPONINI" in the last 168 hours. BNP: Invalid input(s): "POCBNP" CBG: No results for input(s): "GLUCAP" in the last 168 hours. D-Dimer No results for input(s): "DDIMER" in the last 72 hours. Hgb A1c No results for input(s): "HGBA1C" in the last 72 hours. Lipid Profile No results for input(s): "CHOL", "HDL", "LDLCALC", "TRIG", "CHOLHDL",  "LDLDIRECT" in the last 72 hours. Thyroid function studies No results for input(s): "TSH", "T4TOTAL", "T3FREE", "THYROIDAB" in the last 72 hours.  Invalid input(s): "FREET3" Anemia work up No results for input(s): "VITAMINB12", "FOLATE", "FERRITIN", "TIBC", "IRON", "RETICCTPCT" in the last 72 hours. Urinalysis    Component Value Date/Time   COLORURINE STRAW (A) 04/11/2021 2005   APPEARANCEUR HAZY (A) 04/11/2021 2005   LABSPEC 1.004 (L) 04/11/2021 2005   PHURINE 6.0 04/11/2021 2005   GLUCOSEU NEGATIVE 04/11/2021 2005   HGBUR NEGATIVE 04/11/2021 2005   BILIRUBINUR NEGATIVE 04/11/2021 2005   KETONESUR NEGATIVE 04/11/2021 2005   PROTEINUR NEGATIVE 04/11/2021 2005   NITRITE NEGATIVE 04/11/2021 2005   LEUKOCYTESUR NEGATIVE 04/11/2021 2005   Sepsis Labs Recent Labs  Lab 12/07/21 1745  WBC 7.5   Microbiology No results found for this or any previous visit (from the past 240 hour(s)).  SIGNED:   Marinda Elk, MD  [facility] 12/08/2021, 9:32 AM Pager   If 7PM-7AM, please contact night-coverage [email or web address] Password [email or web address]

## 2022-01-19 ENCOUNTER — Ambulatory Visit: Payer: Self-pay | Admitting: Cardiology

## 2022-01-19 DIAGNOSIS — R079 Chest pain, unspecified: Secondary | ICD-10-CM | POA: Insufficient documentation

## 2022-01-19 DIAGNOSIS — I200 Unstable angina: Secondary | ICD-10-CM

## 2022-01-19 MED ORDER — SODIUM CHLORIDE 0.9% FLUSH: 3.0000 mL | Freq: Two times a day (BID) | INTRAVENOUS | Status: DC

## 2022-01-20 ENCOUNTER — Other Ambulatory Visit: Payer: Self-pay

## 2022-01-20 ENCOUNTER — Encounter
Admission: RE | Disposition: A | Discharge status: Home / Self Care | Payer: Self-pay | Source: Home / Self Care | Attending: Cardiovascular Disease

## 2022-01-20 ENCOUNTER — Ambulatory Visit
Admission: RE | Admit: 2022-01-20 | Discharge: 2022-01-20 | Disposition: A | Discharge status: Home / Self Care | Payer: BC Managed Care – PPO | Attending: Cardiovascular Disease | Admitting: Cardiovascular Disease

## 2022-01-20 ENCOUNTER — Encounter: Payer: Self-pay | Admitting: Cardiovascular Disease

## 2022-01-20 DIAGNOSIS — I2510 Atherosclerotic heart disease of native coronary artery without angina pectoris: Secondary | ICD-10-CM | POA: Insufficient documentation

## 2022-01-20 DIAGNOSIS — Z79899 Other long term (current) drug therapy: Secondary | ICD-10-CM | POA: Diagnosis not present

## 2022-01-20 DIAGNOSIS — R079 Chest pain, unspecified: Secondary | ICD-10-CM | POA: Diagnosis not present

## 2022-01-20 DIAGNOSIS — I200 Unstable angina: Secondary | ICD-10-CM

## 2022-01-20 HISTORY — PX: LEFT HEART CATH AND CORONARY ANGIOGRAPHY: CATH118249

## 2022-01-20 HISTORY — PX: CORONARY STENT INTERVENTION: CATH118234

## 2022-01-20 LAB — BASIC METABOLIC PANEL
Anion gap: 6 (ref 5–15)
BUN: 10 mg/dL (ref 6–20)
CO2: 23 mmol/L (ref 22–32)
Calcium: 8.7 mg/dL — ABNORMAL LOW (ref 8.9–10.3)
Chloride: 109 mmol/L (ref 98–111)
Creatinine, Ser: 0.98 mg/dL (ref 0.44–1.00)
GFR, Estimated: >60 mL/min (ref >60)
Glucose, Bld: 120 mg/dL — ABNORMAL HIGH (ref 70–99)
Potassium: 3.8 mmol/L (ref 3.5–5.1)
Sodium: 138 mmol/L (ref 135–145)

## 2022-01-20 LAB — CBC
HCT: 34 % — ABNORMAL LOW (ref 36.0–46.0)
Hemoglobin: 11.1 g/dL — ABNORMAL LOW (ref 12.0–15.0)
MCH: 24.9 pg — ABNORMAL LOW (ref 26.0–34.0)
MCHC: 32.6 g/dL (ref 30.0–36.0)
MCV: 76.2 fL — ABNORMAL LOW (ref 80.0–100.0)
Platelets: 252 10*3/uL (ref 150–400)
RBC: 4.46 MIL/uL (ref 3.87–5.11)
RDW: 17.7 % — ABNORMAL HIGH (ref 11.5–15.5)
WBC: 6.7 10*3/uL (ref 4.0–10.5)
nRBC: 0 % (ref 0.0–0.2)

## 2022-01-20 LAB — PROTIME-INR
INR: 1 (ref 0.8–1.2)
Prothrombin Time: 13.2 seconds (ref 11.4–15.2)

## 2022-01-20 SURGERY — LEFT HEART CATH AND CORONARY ANGIOGRAPHY
Anesthesia: Moderate Sedation

## 2022-01-20 MED ORDER — SODIUM CHLORIDE 0.9% FLUSH: 3.0000 mL | INTRAVENOUS | Status: DC | PRN

## 2022-01-20 MED ORDER — ASPIRIN 81 MG PO CHEW: 81.0000 mg | CHEWABLE_TABLET | ORAL | Status: DC

## 2022-01-20 MED ORDER — PRASUGREL HCL 10 MG PO TABS
ORAL_TABLET | ORAL | Status: AC
  Filled 2022-01-20: qty 2

## 2022-01-20 MED ORDER — ASPIRIN 325 MG PO TBEC: 325.0000 mg | DELAYED_RELEASE_TABLET | Freq: Every day | ORAL | Status: DC

## 2022-01-20 MED ORDER — PRASUGREL HCL 10 MG PO TABS
ORAL_TABLET | ORAL | Status: DC | PRN
  Administered 2022-01-20: 20 mg via ORAL

## 2022-01-20 MED ORDER — BIVALIRUDIN BOLUS VIA INFUSION - CUPID
INTRAVENOUS | Status: DC | PRN
  Administered 2022-01-20: 89.775 mg via INTRAVENOUS

## 2022-01-20 MED ORDER — LABETALOL HCL 5 MG/ML IV SOLN: 10.0000 mg | INTRAVENOUS | Status: DC | PRN

## 2022-01-20 MED ORDER — LIDOCAINE HCL 1 % IJ SOLN
INTRAMUSCULAR | Status: AC
  Filled 2022-01-20: qty 20

## 2022-01-20 MED ORDER — FENTANYL CITRATE (PF) 100 MCG/2ML IJ SOLN
INTRAMUSCULAR | Status: AC
  Filled 2022-01-20: qty 2

## 2022-01-20 MED ORDER — ASPIRIN 81 MG PO CHEW
CHEWABLE_TABLET | ORAL | Status: AC
  Filled 2022-01-20: qty 3

## 2022-01-20 MED ORDER — ACETAMINOPHEN 325 MG PO TABS: 650.0000 mg | ORAL_TABLET | ORAL | Status: DC | PRN

## 2022-01-20 MED ORDER — BIVALIRUDIN TRIFLUOROACETATE 250 MG IV SOLR
INTRAVENOUS | Status: AC
  Filled 2022-01-20: qty 250

## 2022-01-20 MED ORDER — SODIUM CHLORIDE 0.9% FLUSH
3.0000 mL | Freq: Two times a day (BID) | INTRAVENOUS | Status: DC
Start: 2022-01-20 — End: 2022-01-20

## 2022-01-20 MED ORDER — NITROGLYCERIN 0.4 MG SL SUBL
SUBLINGUAL_TABLET | SUBLINGUAL | Status: DC | PRN
  Administered 2022-01-20: .4 mg via SUBLINGUAL

## 2022-01-20 MED ORDER — DILTIAZEM HCL ER COATED BEADS 120 MG PO CP24: 120.0000 mg | ORAL_CAPSULE | Freq: Every day | ORAL | 11 refills | Status: DC

## 2022-01-20 MED ORDER — ONDANSETRON HCL 4 MG/2ML IJ SOLN: 4.0000 mg | Freq: Four times a day (QID) | INTRAMUSCULAR | Status: DC | PRN

## 2022-01-20 MED ORDER — SODIUM CHLORIDE 0.9% FLUSH
3.0000 mL | INTRAVENOUS | Status: DC | PRN
Start: 2022-01-20 — End: 2022-01-20

## 2022-01-20 MED ORDER — SODIUM CHLORIDE 0.9 % IV SOLN
INTRAVENOUS | Status: DC | PRN
  Administered 2022-01-20: 1.75 mg/kg/h via INTRAVENOUS

## 2022-01-20 MED ORDER — FENTANYL CITRATE (PF) 100 MCG/2ML IJ SOLN
INTRAMUSCULAR | Status: DC | PRN
  Administered 2022-01-20: 50 ug via INTRAVENOUS
  Administered 2022-01-20: 25 ug via INTRAVENOUS

## 2022-01-20 MED ORDER — SODIUM CHLORIDE 0.9 % IV SOLN: 250.0000 mL | INTRAVENOUS | Status: DC | PRN

## 2022-01-20 MED ORDER — HYDRALAZINE HCL 20 MG/ML IJ SOLN: 10.0000 mg | INTRAMUSCULAR | Status: DC | PRN

## 2022-01-20 MED ORDER — NITROGLYCERIN 0.4 MG SL SUBL
SUBLINGUAL_TABLET | SUBLINGUAL | Status: AC
  Filled 2022-01-20: qty 1

## 2022-01-20 MED ORDER — IOHEXOL 300 MG/ML  SOLN
INTRAMUSCULAR | Status: DC | PRN
  Administered 2022-01-20: 55 mL

## 2022-01-20 MED ORDER — HEPARIN SODIUM (PORCINE) 1000 UNIT/ML IJ SOLN
INTRAMUSCULAR | Status: AC
  Filled 2022-01-20: qty 10

## 2022-01-20 MED ORDER — SODIUM CHLORIDE 0.9 % IV SOLN
INTRAVENOUS | Status: DC | PRN
  Administered 2022-01-20: 250 mL via INTRAVENOUS

## 2022-01-20 MED ORDER — LIDOCAINE HCL (PF) 1 % IJ SOLN
INTRAMUSCULAR | Status: DC | PRN
  Administered 2022-01-20: 20 mL
  Administered 2022-01-20: 2 mL

## 2022-01-20 MED ORDER — IOHEXOL 300 MG/ML  SOLN
INTRAMUSCULAR | Status: DC | PRN
  Administered 2022-01-20: 67 mL

## 2022-01-20 MED ORDER — MIDAZOLAM HCL 2 MG/2ML IJ SOLN
INTRAMUSCULAR | Status: DC | PRN
  Administered 2022-01-20 (×2): 1 mg via INTRAVENOUS

## 2022-01-20 MED ORDER — FENTANYL CITRATE (PF) 100 MCG/2ML IJ SOLN
INTRAMUSCULAR | Status: DC | PRN
  Administered 2022-01-20: 25 ug via INTRAVENOUS

## 2022-01-20 MED ORDER — SODIUM CHLORIDE 0.9% FLUSH: 3.0000 mL | Freq: Two times a day (BID) | INTRAVENOUS | Status: DC

## 2022-01-20 MED ORDER — SODIUM CHLORIDE 0.9 % WEIGHT BASED INFUSION
1.0000 mL/kg/h | INTRAVENOUS | Status: DC
  Administered 2022-01-20: 1 mL/kg/h via INTRAVENOUS

## 2022-01-20 MED ORDER — HEPARIN (PORCINE) IN NACL 1000-0.9 UT/500ML-% IV SOLN
INTRAVENOUS | Status: DC | PRN
  Administered 2022-01-20 (×2): 500 mL

## 2022-01-20 MED ORDER — SODIUM CHLORIDE 0.9 % WEIGHT BASED INFUSION
1.0000 mL/kg/h | INTRAVENOUS | Status: DC
Start: 2022-01-20 — End: 2022-01-20

## 2022-01-20 MED ORDER — SODIUM CHLORIDE 0.9 % WEIGHT BASED INFUSION
3.0000 mL/kg/h | INTRAVENOUS | Status: DC
  Administered 2022-01-20: 3 mL/kg/h via INTRAVENOUS

## 2022-01-20 MED ORDER — PRASUGREL HCL 10 MG PO TABS
10.0000 mg | ORAL_TABLET | Freq: Every day | ORAL | Status: DC
Start: 2022-01-21 — End: 2022-01-20

## 2022-01-20 MED ORDER — VERAPAMIL HCL 2.5 MG/ML IV SOLN
INTRAVENOUS | Status: AC
  Filled 2022-01-20: qty 2

## 2022-01-20 MED ORDER — MIDAZOLAM HCL 2 MG/2ML IJ SOLN
INTRAMUSCULAR | Status: AC
  Filled 2022-01-20: qty 2

## 2022-01-20 SURGICAL SUPPLY — 23 items
BALLN TREK RX 2.25X15 (BALLOONS)
BALLOON TREK RX 2.25X15 (BALLOONS) IMPLANT
CATH INFINITI 5 FR 3DRC (CATHETERS) ×1 IMPLANT
CATH INFINITI 5FR MULTPACK ANG (CATHETERS) ×1 IMPLANT
CATH INFINITI JR4 5F (CATHETERS) ×1 IMPLANT
CATHETER LAUNCHER 6FR JR4 SH (CATHETERS) ×1 IMPLANT
DEVICE CLOSURE MYNXGRIP 6/7F (Vascular Products) ×1 IMPLANT
DRAPE BRACHIAL (DRAPES) ×1 IMPLANT
GLIDESHEATH SLEND SS 6F .021 (SHEATH) ×1 IMPLANT
GUIDEWIRE INQWIRE 1.5J.035X260 (WIRE) IMPLANT
INQWIRE 1.5J .035X260CM (WIRE)
KIT ENCORE 26 ADVANTAGE (KITS) ×1 IMPLANT
NDL PERC 18GX7CM (NEEDLE) IMPLANT
NEEDLE PERC 18GX7CM (NEEDLE) ×2 IMPLANT
PACK CARDIAC CATH (CUSTOM PROCEDURE TRAY) ×2 IMPLANT
PROTECTION STATION PRESSURIZED (MISCELLANEOUS) ×2
SET ATX SIMPLICITY (MISCELLANEOUS) ×1 IMPLANT
SHEATH AVANTI 5FR X 11CM (SHEATH) ×1 IMPLANT
SHEATH AVANTI 6FR X 11CM (SHEATH) ×1 IMPLANT
STATION PROTECTION PRESSURIZED (MISCELLANEOUS) IMPLANT
TUBING CIL FLEX 10 FLL-RA (TUBING) ×1 IMPLANT
WIRE ASAHI PROWATER 180CM (WIRE) ×1 IMPLANT
WIRE GUIDERIGHT .035X150 (WIRE) ×1 IMPLANT

## 2022-02-07 ENCOUNTER — Encounter: Payer: Self-pay | Admitting: Cardiovascular Disease

## 2022-02-09 ENCOUNTER — Encounter: Payer: Self-pay | Admitting: Cardiovascular Disease

## 2022-04-05 ENCOUNTER — Encounter (HOSPITAL_COMMUNITY)
Admission: EM | Disposition: A | Discharge status: Home / Self Care | Payer: Self-pay | Source: Home / Self Care | Attending: Cardiology

## 2022-04-05 ENCOUNTER — Ambulatory Visit (HOSPITAL_COMMUNITY): Admit: 2022-04-05 | Payer: BC Managed Care – PPO | Admitting: Cardiovascular Disease

## 2022-04-05 ENCOUNTER — Encounter (HOSPITAL_COMMUNITY): Payer: Self-pay

## 2022-04-05 ENCOUNTER — Inpatient Hospital Stay (HOSPITAL_COMMUNITY)
Admission: EM | Admit: 2022-04-05 | Discharge: 2022-04-06 | DRG: 287 | Disposition: A | Discharge status: Home / Self Care | Payer: BC Managed Care – PPO | Attending: Cardiology | Admitting: Cardiology

## 2022-04-05 ENCOUNTER — Emergency Department (HOSPITAL_COMMUNITY): Payer: BC Managed Care – PPO

## 2022-04-05 ENCOUNTER — Other Ambulatory Visit: Payer: Self-pay

## 2022-04-05 DIAGNOSIS — Z79899 Other long term (current) drug therapy: Secondary | ICD-10-CM

## 2022-04-05 DIAGNOSIS — I214 Non-ST elevation (NSTEMI) myocardial infarction: Secondary | ICD-10-CM | POA: Diagnosis not present

## 2022-04-05 DIAGNOSIS — E119 Type 2 diabetes mellitus without complications: Secondary | ICD-10-CM | POA: Diagnosis present

## 2022-04-05 DIAGNOSIS — Z880 Allergy status to penicillin: Secondary | ICD-10-CM | POA: Diagnosis not present

## 2022-04-05 DIAGNOSIS — Z8249 Family history of ischemic heart disease and other diseases of the circulatory system: Secondary | ICD-10-CM | POA: Diagnosis not present

## 2022-04-05 DIAGNOSIS — Z7982 Long term (current) use of aspirin: Secondary | ICD-10-CM

## 2022-04-05 DIAGNOSIS — Z87891 Personal history of nicotine dependence: Secondary | ICD-10-CM

## 2022-04-05 DIAGNOSIS — Z888 Allergy status to other drugs, medicaments and biological substances status: Secondary | ICD-10-CM | POA: Diagnosis not present

## 2022-04-05 DIAGNOSIS — I25111 Atherosclerotic heart disease of native coronary artery with angina pectoris with documented spasm: Principal | ICD-10-CM | POA: Diagnosis present

## 2022-04-05 DIAGNOSIS — E785 Hyperlipidemia, unspecified: Secondary | ICD-10-CM | POA: Diagnosis present

## 2022-04-05 DIAGNOSIS — I10 Essential (primary) hypertension: Secondary | ICD-10-CM | POA: Diagnosis present

## 2022-04-05 DIAGNOSIS — I252 Old myocardial infarction: Secondary | ICD-10-CM

## 2022-04-05 DIAGNOSIS — R079 Chest pain, unspecified: Secondary | ICD-10-CM

## 2022-04-05 DIAGNOSIS — I2510 Atherosclerotic heart disease of native coronary artery without angina pectoris: Secondary | ICD-10-CM | POA: Diagnosis not present

## 2022-04-05 DIAGNOSIS — Z955 Presence of coronary angioplasty implant and graft: Secondary | ICD-10-CM

## 2022-04-05 HISTORY — PX: LEFT HEART CATH AND CORONARY ANGIOGRAPHY: CATH118249

## 2022-04-05 LAB — PROTIME-INR
INR: 1.1 (ref 0.8–1.2)
Prothrombin Time: 14.3 seconds (ref 11.4–15.2)

## 2022-04-05 LAB — LIPID PANEL
Cholesterol: 200 mg/dL (ref 0–200)
HDL: 40 mg/dL — ABNORMAL LOW (ref >40)
LDL Cholesterol: 148 mg/dL — ABNORMAL HIGH (ref 0–99)
Total CHOL/HDL Ratio: 5 RATIO
Triglycerides: 58 mg/dL (ref <150)
VLDL: 12 mg/dL (ref 0–40)

## 2022-04-05 LAB — TSH: TSH: 0.493 u[IU]/mL (ref 0.350–4.500)

## 2022-04-05 LAB — CBC WITH DIFFERENTIAL/PLATELET
Abs Immature Granulocytes: 0.05 10*3/uL (ref 0.00–0.07)
Basophils Absolute: 0 10*3/uL (ref 0.0–0.1)
Basophils Relative: 0 %
Eosinophils Absolute: 0 10*3/uL (ref 0.0–0.5)
Eosinophils Relative: 0 %
HCT: 33 % — ABNORMAL LOW (ref 36.0–46.0)
Hemoglobin: 10.8 g/dL — ABNORMAL LOW (ref 12.0–15.0)
Immature Granulocytes: 1 %
Lymphocytes Relative: 21 %
Lymphs Abs: 2.3 10*3/uL (ref 0.7–4.0)
MCH: 25.7 pg — ABNORMAL LOW (ref 26.0–34.0)
MCHC: 32.7 g/dL (ref 30.0–36.0)
MCV: 78.4 fL — ABNORMAL LOW (ref 80.0–100.0)
Monocytes Absolute: 0.9 10*3/uL (ref 0.1–1.0)
Monocytes Relative: 8 %
Neutro Abs: 7.8 10*3/uL — ABNORMAL HIGH (ref 1.7–7.7)
Neutrophils Relative %: 70 %
Platelets: 280 10*3/uL (ref 150–400)
RBC: 4.21 MIL/uL (ref 3.87–5.11)
RDW: 16.9 % — ABNORMAL HIGH (ref 11.5–15.5)
WBC: 11.1 10*3/uL — ABNORMAL HIGH (ref 4.0–10.5)
nRBC: 0 % (ref 0.0–0.2)

## 2022-04-05 LAB — BASIC METABOLIC PANEL
Anion gap: 6 (ref 5–15)
BUN: 11 mg/dL (ref 6–20)
CO2: 20 mmol/L — ABNORMAL LOW (ref 22–32)
Calcium: 9 mg/dL (ref 8.9–10.3)
Chloride: 110 mmol/L (ref 98–111)
Creatinine, Ser: 1.22 mg/dL — ABNORMAL HIGH (ref 0.44–1.00)
GFR, Estimated: 57 mL/min — ABNORMAL LOW (ref >60)
Glucose, Bld: 93 mg/dL (ref 70–99)
Potassium: 3.9 mmol/L (ref 3.5–5.1)
Sodium: 136 mmol/L (ref 135–145)

## 2022-04-05 LAB — TROPONIN I (HIGH SENSITIVITY)
Troponin I (High Sensitivity): 623 ng/L (ref <18)
Troponin I (High Sensitivity): 1668 ng/L (ref <18)

## 2022-04-05 LAB — I-STAT BETA HCG BLOOD, ED (MC, WL, AP ONLY): I-stat hCG, quantitative: <5 m[IU]/mL (ref <5)

## 2022-04-05 LAB — HEMOGLOBIN A1C
Hgb A1c MFr Bld: 6.2 % — ABNORMAL HIGH (ref 4.8–5.6)
Mean Plasma Glucose: 131.24 mg/dL

## 2022-04-05 SURGERY — LEFT HEART CATH AND CORONARY ANGIOGRAPHY
Anesthesia: LOCAL

## 2022-04-05 MED ORDER — SODIUM CHLORIDE 0.9 % WEIGHT BASED INFUSION: 3.0000 mL/kg/h | INTRAVENOUS | Status: DC

## 2022-04-05 MED ORDER — HEPARIN SODIUM (PORCINE) 1000 UNIT/ML IJ SOLN
INTRAMUSCULAR | Status: AC
  Filled 2022-04-05: qty 10

## 2022-04-05 MED ORDER — FENTANYL CITRATE (PF) 100 MCG/2ML IJ SOLN
INTRAMUSCULAR | Status: DC | PRN
  Administered 2022-04-05 (×2): 25 ug via INTRAVENOUS

## 2022-04-05 MED ORDER — VERAPAMIL HCL 2.5 MG/ML IV SOLN
INTRAVENOUS | Status: DC | PRN
  Administered 2022-04-05: 2 mg via INTRA_ARTERIAL

## 2022-04-05 MED ORDER — ASPIRIN 81 MG PO CHEW: 81.0000 mg | CHEWABLE_TABLET | ORAL | Status: DC

## 2022-04-05 MED ORDER — HEPARIN (PORCINE) IN NACL 1000-0.9 UT/500ML-% IV SOLN
INTRAVENOUS | Status: AC
  Filled 2022-04-05: qty 1000

## 2022-04-05 MED ORDER — SODIUM CHLORIDE 0.9 % WEIGHT BASED INFUSION
3.0000 mL/kg/h | INTRAVENOUS | Status: DC
  Administered 2022-04-05: 3 mL/kg/h via INTRAVENOUS

## 2022-04-05 MED ORDER — BUSPIRONE HCL 10 MG PO TABS: 10.0000 mg | ORAL_TABLET | Freq: Two times a day (BID) | ORAL | Status: DC | PRN

## 2022-04-05 MED ORDER — SODIUM CHLORIDE 0.9% FLUSH: 3.0000 mL | INTRAVENOUS | Status: DC | PRN

## 2022-04-05 MED ORDER — HEPARIN SODIUM (PORCINE) 1000 UNIT/ML IJ SOLN
INTRAMUSCULAR | Status: DC | PRN
  Administered 2022-04-05: 5000 [IU] via INTRAVENOUS

## 2022-04-05 MED ORDER — PRASUGREL HCL 10 MG PO TABS: 10.0000 mg | ORAL_TABLET | Freq: Every day | ORAL | Status: DC

## 2022-04-05 MED ORDER — NITROGLYCERIN 1 MG/10 ML FOR IR/CATH LAB
INTRA_ARTERIAL | Status: DC | PRN
  Administered 2022-04-05: 200 ug via INTRACORONARY
  Administered 2022-04-05: 500 ug via INTRA_ARTERIAL

## 2022-04-05 MED ORDER — VERAPAMIL HCL 2.5 MG/ML IV SOLN
INTRAVENOUS | Status: AC
  Filled 2022-04-05: qty 2

## 2022-04-05 MED ORDER — NITROGLYCERIN IN D5W 200-5 MCG/ML-% IV SOLN
0.0000 ug/min | INTRAVENOUS | Status: DC
  Administered 2022-04-05: 5 ug/min via INTRAVENOUS
  Filled 2022-04-05: qty 250

## 2022-04-05 MED ORDER — SODIUM CHLORIDE 0.9 % IV SOLN: 250.0000 mL | INTRAVENOUS | Status: DC | PRN

## 2022-04-05 MED ORDER — LABETALOL HCL 5 MG/ML IV SOLN: 10.0000 mg | INTRAVENOUS | Status: AC | PRN

## 2022-04-05 MED ORDER — NITROGLYCERIN 0.4 MG SL SUBL: 0.4000 mg | SUBLINGUAL_TABLET | SUBLINGUAL | Status: DC | PRN

## 2022-04-05 MED ORDER — SODIUM CHLORIDE 0.9 % WEIGHT BASED INFUSION
1.0000 mL/kg/h | INTRAVENOUS | Status: DC
  Administered 2022-04-05: 1 mL/kg/h via INTRAVENOUS

## 2022-04-05 MED ORDER — MIDAZOLAM HCL 2 MG/2ML IJ SOLN
INTRAMUSCULAR | Status: AC
  Filled 2022-04-05: qty 2

## 2022-04-05 MED ORDER — ASPIRIN 81 MG PO TBEC: 81.0000 mg | DELAYED_RELEASE_TABLET | Freq: Every day | ORAL | Status: DC

## 2022-04-05 MED ORDER — NITROGLYCERIN 1 MG/10 ML FOR IR/CATH LAB
INTRA_ARTERIAL | Status: AC
  Filled 2022-04-05: qty 10

## 2022-04-05 MED ORDER — RANOLAZINE ER 500 MG PO TB12: 1000.0000 mg | ORAL_TABLET | Freq: Two times a day (BID) | ORAL | Status: DC

## 2022-04-05 MED ORDER — RANOLAZINE ER 500 MG PO TB12
1000.0000 mg | ORAL_TABLET | Freq: Two times a day (BID) | ORAL | Status: DC
  Administered 2022-04-05 – 2022-04-06 (×3): 1000 mg via ORAL
  Filled 2022-04-05 (×4): qty 2

## 2022-04-05 MED ORDER — SPIRONOLACTONE 25 MG PO TABS
25.0000 mg | ORAL_TABLET | Freq: Every day | ORAL | Status: DC
  Administered 2022-04-05 – 2022-04-06 (×2): 25 mg via ORAL
  Filled 2022-04-05 (×2): qty 1

## 2022-04-05 MED ORDER — PANTOPRAZOLE SODIUM 40 MG PO TBEC: 40.0000 mg | DELAYED_RELEASE_TABLET | Freq: Two times a day (BID) | ORAL | Status: DC | PRN

## 2022-04-05 MED ORDER — EZETIMIBE 10 MG PO TABS
10.0000 mg | ORAL_TABLET | Freq: Every day | ORAL | Status: DC
  Administered 2022-04-05 – 2022-04-06 (×2): 10 mg via ORAL
  Filled 2022-04-05 (×2): qty 1

## 2022-04-05 MED ORDER — IOHEXOL 350 MG/ML SOLN
INTRAVENOUS | Status: DC | PRN
  Administered 2022-04-05: 40 mL

## 2022-04-05 MED ORDER — ENALAPRIL MALEATE 10 MG PO TABS
20.0000 mg | ORAL_TABLET | Freq: Every day | ORAL | Status: DC
  Administered 2022-04-06: 20 mg via ORAL
  Filled 2022-04-05: qty 8

## 2022-04-05 MED ORDER — VITAMIN D 25 MCG (1000 UNIT) PO TABS
5000.0000 [IU] | ORAL_TABLET | Freq: Every day | ORAL | Status: DC
  Administered 2022-04-06: 5000 [IU] via ORAL
  Filled 2022-04-05 (×2): qty 5

## 2022-04-05 MED ORDER — ALIROCUMAB 75 MG/ML ~~LOC~~ SOAJ: 75.0000 mg | SUBCUTANEOUS | Status: DC

## 2022-04-05 MED ORDER — ATORVASTATIN CALCIUM 80 MG PO TABS
80.0000 mg | ORAL_TABLET | Freq: Every day | ORAL | Status: DC
  Filled 2022-04-05: qty 2
  Filled 2022-04-05: qty 1

## 2022-04-05 MED ORDER — SODIUM CHLORIDE 0.9% FLUSH
3.0000 mL | Freq: Two times a day (BID) | INTRAVENOUS | Status: DC
  Administered 2022-04-05 – 2022-04-06 (×2): 3 mL via INTRAVENOUS

## 2022-04-05 MED ORDER — ACETAMINOPHEN 325 MG PO TABS
650.0000 mg | ORAL_TABLET | ORAL | Status: DC | PRN
  Administered 2022-04-05 (×2): 650 mg via ORAL
  Filled 2022-04-05 (×2): qty 2

## 2022-04-05 MED ORDER — ACETAMINOPHEN 325 MG PO TABS: 650.0000 mg | ORAL_TABLET | ORAL | Status: DC | PRN

## 2022-04-05 MED ORDER — BUSPIRONE HCL 10 MG PO TABS: 10.0000 mg | ORAL_TABLET | Freq: Every day | ORAL | Status: DC

## 2022-04-05 MED ORDER — LIDOCAINE HCL (PF) 1 % IJ SOLN
INTRAMUSCULAR | Status: DC | PRN
  Administered 2022-04-05: 2 mL

## 2022-04-05 MED ORDER — AMLODIPINE BESYLATE 5 MG PO TABS
5.0000 mg | ORAL_TABLET | Freq: Every day | ORAL | Status: DC
  Administered 2022-04-05 – 2022-04-06 (×2): 5 mg via ORAL
  Filled 2022-04-05 (×2): qty 1

## 2022-04-05 MED ORDER — CARVEDILOL 25 MG PO TABS
25.0000 mg | ORAL_TABLET | Freq: Two times a day (BID) | ORAL | Status: DC
  Administered 2022-04-05 – 2022-04-06 (×2): 25 mg via ORAL
  Filled 2022-04-05 (×2): qty 1

## 2022-04-05 MED ORDER — ENALAPRIL MALEATE 2.5 MG PO TABS
20.0000 mg | ORAL_TABLET | Freq: Every day | ORAL | Status: DC
  Filled 2022-04-05: qty 8

## 2022-04-05 MED ORDER — INSULIN ASPART 100 UNIT/ML IJ SOLN: 0.0000 [IU] | Freq: Three times a day (TID) | INTRAMUSCULAR | Status: DC

## 2022-04-05 MED ORDER — VERAPAMIL HCL 2.5 MG/ML IV SOLN
INTRAVENOUS | Status: DC | PRN
  Administered 2022-04-05: 10 mL via INTRA_ARTERIAL

## 2022-04-05 MED ORDER — HEPARIN BOLUS VIA INFUSION
4000.0000 [IU] | Freq: Once | INTRAVENOUS | Status: AC
  Administered 2022-04-05: 4000 [IU] via INTRAVENOUS
  Filled 2022-04-05: qty 4000

## 2022-04-05 MED ORDER — PRASUGREL HCL 10 MG PO TABS
10.0000 mg | ORAL_TABLET | Freq: Every day | ORAL | Status: DC
  Administered 2022-04-05 – 2022-04-06 (×2): 10 mg via ORAL
  Filled 2022-04-05 (×2): qty 1

## 2022-04-05 MED ORDER — CARVEDILOL 25 MG PO TABS: 25.0000 mg | ORAL_TABLET | Freq: Two times a day (BID) | ORAL | Status: DC

## 2022-04-05 MED ORDER — HEPARIN (PORCINE) 25000 UT/250ML-% IV SOLN
1050.0000 [IU]/h | INTRAVENOUS | Status: DC
  Administered 2022-04-05: 1050 [IU]/h via INTRAVENOUS
  Filled 2022-04-05: qty 250

## 2022-04-05 MED ORDER — ASPIRIN 81 MG PO TBEC
81.0000 mg | DELAYED_RELEASE_TABLET | Freq: Every day | ORAL | Status: DC
  Administered 2022-04-06: 81 mg via ORAL
  Filled 2022-04-05: qty 1

## 2022-04-05 MED ORDER — SODIUM CHLORIDE 0.9 % WEIGHT BASED INFUSION: 1.0000 mL/kg/h | INTRAVENOUS | Status: DC

## 2022-04-05 MED ORDER — MIDAZOLAM HCL 2 MG/2ML IJ SOLN
INTRAMUSCULAR | Status: DC | PRN
  Administered 2022-04-05: 2 mg via INTRAVENOUS
  Administered 2022-04-05: 1 mg via INTRAVENOUS

## 2022-04-05 MED ORDER — LIDOCAINE HCL (PF) 1 % IJ SOLN
INTRAMUSCULAR | Status: AC
  Filled 2022-04-05: qty 30

## 2022-04-05 MED ORDER — NITROGLYCERIN 0.4 MG SL SUBL
0.4000 mg | SUBLINGUAL_TABLET | SUBLINGUAL | Status: DC | PRN
  Administered 2022-04-05: 0.4 mg via SUBLINGUAL
  Filled 2022-04-05: qty 1

## 2022-04-05 MED ORDER — SODIUM CHLORIDE 0.9 % IV SOLN: INTRAVENOUS | Status: AC

## 2022-04-05 MED ORDER — HEPARIN (PORCINE) IN NACL 1000-0.9 UT/500ML-% IV SOLN
INTRAVENOUS | Status: DC | PRN
  Administered 2022-04-05 (×2): 500 mL

## 2022-04-05 MED ORDER — ONDANSETRON HCL 4 MG/2ML IJ SOLN: 4.0000 mg | Freq: Four times a day (QID) | INTRAMUSCULAR | Status: DC | PRN

## 2022-04-05 MED ORDER — FENTANYL CITRATE (PF) 100 MCG/2ML IJ SOLN
INTRAMUSCULAR | Status: AC
  Filled 2022-04-05: qty 2

## 2022-04-05 MED ORDER — HYDRALAZINE HCL 20 MG/ML IJ SOLN: 10.0000 mg | INTRAMUSCULAR | Status: AC | PRN

## 2022-04-05 SURGICAL SUPPLY — 9 items
BAND ZEPHYR COMPRESS 30 LONG (HEMOSTASIS) IMPLANT
CATH 5FR JL3.5 JR4 ANG PIG MP (CATHETERS) IMPLANT
GLIDESHEATH SLEND SS 6F .021 (SHEATH) IMPLANT
GUIDEWIRE INQWIRE 1.5J.035X260 (WIRE) IMPLANT
INQWIRE 1.5J .035X260CM (WIRE) ×1
KIT HEART LEFT (KITS) ×1 IMPLANT
PACK CARDIAC CATHETERIZATION (CUSTOM PROCEDURE TRAY) ×1 IMPLANT
TRANSDUCER W/STOPCOCK (MISCELLANEOUS) ×1 IMPLANT
TUBING CIL FLEX 10 FLL-RA (TUBING) ×1 IMPLANT

## 2022-04-05 NOTE — ED Provider Notes (Signed)
[facility] EMERGENCY DEPARTMENT Provider Note   CSN: [id number] Arrival date & time: 04/05/22  0831     History  Chief Complaint  Patient presents with   Chest Pain    Sonya [name] is a 41 y.o. female w/ hx of CAD s/p MI s/p multiple stentings, most recently RCA stent in April 2023 via [facility], on aspirin and Brilinta, presented to ED with chest pain.  Patient reports her pain began yesterday evening overnight.  She says she was having chest pressures felt like heartburn associated with tingling and heaviness in her left arm and into her left jaw.  The pain seem to get better and she went home this morning, then took her son to work, and subsequently around 6 or 7 AM when returning home began having worsening chest pressure, this time reporting "my whole left arm felt dead".  She called EMS, who performed a rhythm strip, showing concerning ST elevations in the inferior and lateral leads, patient was activated as a code STEMI and diverted to [facility].  She received 2 doses of nitroglycerin by EMS as well as full dose aspirin.  Patient reports that her pain significantly improved after the nitroglycerin, currently 2 or 3 out of 10.  She reports compliance with all of her medications.  HPI     Home Medications Prior to Admission medications   Medication Sig Start Date End Date Taking? Authorizing Provider  aspirin EC 81 MG tablet Take 81 mg by mouth daily. Swallow whole.   Yes [provider]  busPIRone (BUSPAR) 10 MG tablet Take 10 mg by mouth 2 (two) times daily as needed for anxiety. 11/18/21  Yes [provider]  carvedilol (COREG) 25 MG tablet Take 25 mg by mouth 2 (two) times daily. 03/22/22  Yes [provider]  Cholecalciferol (VITAMIN D) 125 MCG (5000 UT) CAPS Take 5,000 Units by mouth daily.   Yes [provider]  diltiazem (CARDIZEM CD) 120 MG 24 hr capsule Take 120 mg by mouth daily. 03/18/22  Yes [provider]  enalapril (VASOTEC) 20 MG tablet Take 20 mg by mouth daily. 01/24/22  Yes [provider]  nitroGLYCERIN (NITROSTAT) 0.4 MG SL tablet Place 0.4 mg under the tongue every 5 (five) minutes as needed for chest pain. 01/27/22  Yes [provider]  nystatin cream (MYCOSTATIN) Apply 1 Application topically at bedtime as needed for rash. 12/09/21  Yes [provider]  pantoprazole (PROTONIX) 40 MG tablet Take 40 mg by mouth 2 (two) times daily as needed (heartburn). 01/04/22  Yes [provider]  PRALUENT 75 MG/ML SOAJ Inject 75 mg into the skin every 14 (fourteen) days. 02/13/22  Yes [provider]  prasugrel (EFFIENT) 10 MG TABS tablet Take 10 mg by mouth daily. 03/22/22  Yes [provider]  ranolazine (RANEXA) 1000 MG SR tablet Take 1,000 mg by mouth 2 (two) times daily. 01/09/22  Yes [provider]  spironolactone (ALDACTONE) 25 MG tablet Take 25 mg by mouth daily. 01/02/22  Yes [provider]      Allergies    Atorvastatin, Metoprolol, and Penicillins    Review of Systems   Review of Systems  Physical Exam Updated Vital Signs BP (!) 135/105   Pulse 63   Temp 98.1 F (36.7 C) (Oral)   Resp 17   Ht 5\' 11"  (1.803 m)   Wt 115.7 kg   LMP 03/22/2022 (Approximate)   SpO2 100%   BMI  35.57 kg/m  Physical Exam Constitutional:      General: She is not in acute distress. HENT:     Head: Normocephalic and atraumatic.  Eyes:     Conjunctiva/sclera: Conjunctivae normal.     Pupils: Pupils are equal, round, and reactive to light.  Cardiovascular:     Rate and Rhythm: Normal rate and regular rhythm.  Pulmonary:     Effort: Pulmonary effort is normal. No respiratory distress.  Abdominal:     General: There is no distension.     Tenderness: There is no abdominal tenderness.  Skin:    General: Skin is warm and dry.  Neurological:     General: No focal deficit present.     Mental Status: She is alert. Mental  status is at baseline.  Psychiatric:        Mood and Affect: Mood normal.        Behavior: Behavior normal.     ED Results / Procedures / Treatments   Labs (all labs ordered are listed, but only abnormal results are displayed) Labs Reviewed  BASIC METABOLIC PANEL - Abnormal; Notable for the following components:      Result Value   CO2 20 (*)    Creatinine, Ser 1.22 (*)    GFR, Estimated 57 (*)    All other components within normal limits  CBC WITH DIFFERENTIAL/PLATELET - Abnormal; Notable for the following components:   WBC 11.1 (*)    Hemoglobin 10.8 (*)    HCT 33.0 (*)    MCV 78.4 (*)    MCH 25.7 (*)    RDW 16.9 (*)    Neutro Abs 7.8 (*)    All other components within normal limits  LIPID PANEL - Abnormal; Notable for the following components:   HDL 40 (*)    LDL Cholesterol 148 (*)    All other components within normal limits  HEMOGLOBIN A1C - Abnormal; Notable for the following components:   Hgb A1c MFr Bld 6.2 (*)    All other components within normal limits  TROPONIN I (HIGH SENSITIVITY) - Abnormal; Notable for the following components:   Troponin I (High Sensitivity) 623 (*)    All other components within normal limits  TROPONIN I (HIGH SENSITIVITY) - Abnormal; Notable for the following components:   Troponin I (High Sensitivity) 1,668 (*)    All other components within normal limits  PROTIME-INR  TSH  HEPARIN LEVEL (UNFRACTIONATED)  HIV ANTIBODY (ROUTINE TESTING W REFLEX)  I-STAT BETA HCG BLOOD, ED ([facility], [facility], [facility] ONLY)    EKG EKG Interpretation  Date/Time:  Wednesday April 05 2022 10:02:54 EDT Ventricular Rate:  66 PR Interval:  183 QRS Duration: 91 QT Interval:  395 QTC Calculation: 414 R Axis:   5 Text Interpretation: Sinus rhythm Low voltage, precordial leads Borderline T abnormalities, anterior leads Confirmed by Alvester Chou [phone]) on 04/05/2022 10:24:48 AM  Radiology DG Chest Portable 1 View  Result Date: 04/05/2022 CLINICAL DATA:  Chest  pain. EXAM: PORTABLE CHEST 1 VIEW COMPARISON:  12/07/21 CXR FINDINGS: The heart size and mediastinal contours are within normal limits. Both lungs are clear. The visualized skeletal structures are unremarkable. IMPRESSION: No active disease. Electronically Signed   By: Lorenza Cambridge M.D.   On: 04/05/2022 09:08    Procedures .Critical Care  Performed by: Terald Sleeper, MD Authorized by: Terald Sleeper, MD   Critical care provider statement:    Critical care time (minutes):  35   Critical care time was exclusive  of:  Separately billable procedures and treating other patients   Critical care was necessary to treat or prevent imminent or life-threatening deterioration of the following conditions:  Circulatory failure   Critical care was time spent personally by me on the following activities:  Ordering and performing treatments and interventions, ordering and review of laboratory studies, ordering and review of radiographic studies, pulse oximetry, review of old charts, examination of patient and evaluation of patient's response to treatment   Care discussed with: admitting provider   Comments:     Heparin for NSTEMI     Medications Ordered in ED Medications  nitroGLYCERIN (NITROSTAT) SL tablet 0.4 mg (0.4 mg Sublingual Given 04/05/22 1025)  heparin ADULT infusion 100 units/mL (25000 units/268mL) (1,050 Units/hr Intravenous New Bag/Given 04/05/22 1129)  aspirin EC tablet 81 mg (has no administration in time range)  acetaminophen (TYLENOL) tablet 650 mg (has no administration in time range)  nitroGLYCERIN 50 mg in dextrose 5 % 250 mL (0.2 mg/mL) infusion (5 mcg/min Intravenous New Bag/Given 04/05/22 1239)  atorvastatin (LIPITOR) tablet 80 mg (80 mg Oral Patient Refused/Not Given 04/05/22 1241)  carvedilol (COREG) tablet 25 mg (has no administration in time range)  busPIRone (BUSPAR) tablet 10 mg (has no administration in time range)  prasugrel (EFFIENT) tablet 10 mg (has no  administration in time range)  ranolazine (RANEXA) 12 hr tablet 1,000 mg (1,000 mg Oral Given 04/05/22 1241)  insulin aspart (novoLOG) injection 0-6 Units (has no administration in time range)  0.9% sodium chloride infusion (3 mL/kg/hr  115.7 kg Intravenous New Bag/Given 04/05/22 1240)    Followed by  0.9% sodium chloride infusion (has no administration in time range)  heparin bolus via infusion 4,000 Units (4,000 Units Intravenous Bolus from Bag 04/05/22 1130)    ED Course/ Medical Decision Making/ A&P Clinical Course as of 04/05/22 1250  Wed Apr 05, 2022  0939 Spoke to [place], cards consult placed [MT]  1004 Pt reporting chest pain rising to 6/10- additional SL nitro ordered. [MT]  1024 Repeat ECG shows no acute ischemic changes [MT]  1109 Trop 623 - consistent with NSTEMI.  IV heparin ordered.  Pending cardiology consult. [MT]    Clinical Course User Index [MT] Coty Student, Carola Rhine, MD                           Medical Decision Making Amount and/or Complexity of Data Reviewed Labs: ordered. Radiology: ordered.  Risk Prescription drug management. Decision regarding hospitalization.   This patient presents to the ED with concern for chest pain. This involves an extensive number of treatment options, and is a complaint that carries with it a high risk of complications and morbidity.  The differential diagnosis includes ACS versus coronary vasospasm versus pneumonia versus pneumothorax versus other  Co-morbidities that complicate the patient evaluation: History of significant coronary disease at high risk for coronary complication  I personally reviewed and interpreted the paramedic strips, photo uploaded in media tab, these are concerning for inferior and lateral elevations.  However upon arrival the patient's repeat EKG shows flattening of his elevations and now back in normal sinus rhythm without these acute ischemic findings consistent with a STEMI.  The cardiology Cath Lab  attending cancelled CODE STEMI.  Additional history obtained from EMS  External records from outside source obtained and reviewed including [facility] report April 2023, more recent ED visit and overnight hospitalization with suspected right coronary vasospasms in July 2023  I ordered and personally  interpreted labs.  The pertinent results include:  Trop 600's -> 1668, BMP and CBC near basleine levels, WBC 11.1.  I ordered imaging studies including x-ray of the chest I independently visualized and interpreted imaging which showed no focal abnormalities I agree with the radiologist interpretation  The patient was maintained on a cardiac monitor.  I personally viewed and interpreted the cardiac monitored which showed an underlying rhythm of: Sinus rhythm  Per my interpretation the patient's ECG shows sinus rhythm with no ST elevations  I ordered medication including nitro for chest pain, suspected anginal, and IV heparin for NSTEMI. Patient received 325 aspirin by EMS prior to arrival.  I have reviewed the patients home medicines and have made adjustments as needed  Test Considered: Lower suspicion for acute PE in this clinical setting, do not feel that CT PE indicated.  Likewise her left arm heaviness more consistent with a coronary complication, and I do not have concern for stroke at this time.  I requested consultation with the cardiology,  and discussed lab and imaging findings as well as pertinent plan - they recommend: admission, agreed with heparin for now; the cardiology service will determine further testing or invasive treatment options in discussion with patient  After the interventions noted above, I reevaluated the patient and found that they have: stayed the same  Persistent chest pain  Dispostion:  After consideration of the diagnostic results and the patients response to treatment, I feel that the patent would benefit from admission to the hospital.         Final  Clinical Impression(s) / ED Diagnoses Final diagnoses:  NSTEMI (non-ST elevated myocardial infarction) (HCC)  Chest pain, unspecified type    Rx / DC Orders ED Discharge Orders     None         Terald Sleeper, MD 04/05/22 1250

## 2022-04-05 NOTE — Progress Notes (Signed)
ANTICOAGULATION CONSULT NOTE - Initial Consult  Pharmacy Consult for heparin  Indication: chest pain/ACS  Allergies  Allergen Reactions   Penicillins Other (See Comments)    Patient Measurements: Height: 5\' 11"  (180.3 cm) Weight: 115.7 kg (255 lb) IBW/kg (Calculated) : 70.8 Heparin Dosing Weight: 96.7kg   Vital Signs: Temp: 98.3 F (36.8 C) (10/11 0833) Temp Source: Oral (10/11 0833) BP: 96/73 (10/11 1100) Pulse Rate: 65 (10/11 1100)  Labs: Recent Labs    04/05/22 0936  HGB 10.8*  HCT 33.0*  PLT 280  LABPROT 14.3  INR 1.1  CREATININE 1.22*  TROPONINIHS 623*    Estimated Creatinine Clearance: 84.2 mL/min (A) (by C-G formula based on SCr of 1.22 mg/dL (H)).   Medical History: No past medical history on file.  Assessment: Patient presented with chest pain, initially paged as STEMI alert. STEMI cancelled, patient received aspirin with EMS (no heparin given). Trop is > 600. Patient on no anticoagulation prior to admission. Hemoglobin is stable at 11.1. Pharmacy consulted to start heparin for NSTEMI.   Renal function stable.   Goal of Therapy:  Heparin level 0.3-0.7 units/ml Monitor platelets by anticoagulation protocol: Yes   Plan:  Give 4000 units bolus x 1 Start heparin infusion at 1150 units/hr Check anti-Xa level in 6 hours and daily while on heparin Check H & H daily.   Ventura Sellers 04/05/2022,11:14 AM

## 2022-04-05 NOTE — Progress Notes (Signed)
BP cuff deflated but TR band still in place. Patient complaining of pain in right arm but hematoma looks about the same as when brought from Cath Lab. This RN paged the Cardiology NP to inform of hematoma. This RN will pass on updates to oncoming RN to continue to monitor arm.  [place] C Libi Corso

## 2022-04-05 NOTE — H&P (Signed)
Cardiology Consultation   Patient ID: DHYANA BASTONE MRN: [id number]; DOB: 07-09-1980  Admit date: 04/05/2022 Date of Consult: 04/05/2022  PCP:  Patient, No Pcp Per   [facility] Cardiologist:   Neoma Laming, MD      Patient Profile:   Sonya [name] is a 41 y.o. female with a hx of CAD s/p multiple PCI's to LAD, LCx, RCA, HTN, hyperlipidemia, type 2 diabetes mellitus, tobacco use,  who is being seen 04/05/2022 for the evaluation of chest pain at the request of Dr Langston Masker.  History of Present Illness:   Sonya [name] with above PMH presented to ER for c/o chest pain.  From merged chart review, she historically follows Dr. Humphrey Rolls since 2019 for CAD, had underwent mutiple cardiac cath and interventions in the past.   She was initially hospitalized for NSTEMI on 06/08/18, cardiac cath showed 90-95% proximal LAD disease where she was terated with DES. Echo from 06/08/18 showed LVEF 65%.    She had repeat cardiac cath on 06/15/20 showed high grade distal RCA lesion but at bifurcation with PDA/PLV, she was recommended medical therapy.     She was hospitlaized on 04/14/21 for unstabel angina, reportedly had high risk NM stress on 11/03/20 prior to that. Cardiac cath 04/14/21 showed 75% mid Cx stenosis,  she was treated DES,  there was 55% distal RCA stenosis, 30% stenosis of prox to mid LAD, LVEF was 55-65% by estimate with normal LVEDP.   She was hospitalized again 10/11/21 and saw Dr End for inferior STEMI, emergent cath confirmed thrombotic subtotal occlusion of the mid RCA secondary to acute plaque rupture, patent prox LAD stent with 20% ISR, patent mid/distal LCx stent.  She was treated with DES x 2 to mid RCA and distal RCA. She was recommended switching from clopidogrel to prasugrel, while continue ASA 81mg  for total 12 month.  LVEDP was 67mm Hg. Echo 10/12/21 showed LVEF 60-65%, grade I DD, normal RV, trivial MR and AR.  She was discharged on medical therapy with  aspirin, prasugrel, carvedilol, Lipitor, Imdur, ranolazine.   She was hospitlaized last on 12/08/2021 for chest pain and saw Dr. Humphrey Rolls, pain was felt atypical, had negative troponin with ACS ruled out.  She was advised increasing isosorbide to twice daily dosing and outpatient follow-up with cardiology.  She was discharged on aspirin 81 mg, prasugrel10 mg daily, Lipitor 80 mg, carvedilol 25 mg twice daily, isosorbide mononitrate 120 mg daily, ranolazine 1000 mg twice daily, enalapril 20mg  daily.   Her last cardiac cath was 01/20/2022 by Dr. Humphrey Rolls for chest pain,  which revealed 99% stenosis ostial to proximal RCA lesion.  LVEDP was normal.  LVEF 50 to 55% by estimate.  PCI was proceeded, there was severe vasospasm of ostial RCA during diagnostic cath which was felt the cause of acute occlusion.  She was continued on Imdur and added diltiazem CD120 mg daily.  She presented to the ER today reporting recurrent onset of chest pain starting 230 AM. She was at work during non-exertional task.  She describes central chest pressure feeling with associated jaw and left arm tingling and numbness.  She initially thought this was heart burn. Pain is similar to previous Mis and vasospasm episode. She took Nitro and it seemed relieved the pain to lesser degree.  This morning around 6 or 7 AM, she finished work and returned home, she had worsened chest discomfort where she called EMS. EKG was performed in the field concerning for ST elevations of  inferior and lateral leads, where code STEMI was called.  She was given 2 tablets of nitroglycerin by EMS as well as full dose aspirin.  She reports improved chest pain after nitroglycerin.  Code STEMI was canceled upon reviewing EKG by cardiology MD. Cardiology consult is now requested for further evaluation of chest pain. She states her chest pain is mild at 4/10 now after receiving another dose of SL Nitro at ED. She denied any dizziness, syncope, SOB, peripheral edema, rapid weight  gain. She takes all of her medication daily. She quit smoking in last Oct. She states she is no longer diabetic and nor taking metformin anymore.    Diagnostic work-up so far revealed mild leukocytosis with WBC 11,100, anemia with hemoglobin 10.8.  BMP with Cr 1.22 and GFR 57.  INR 1.1.  hCG negative.  High sensitive troponin 623. HDL 40 and LDL 148.  Chest x-ray revealed no acute disease.  EKG revealed sinus rhythm with ventricular rate of 60bpm, TWI of lead III, V1-V4, similar to previous EKG on 01/20/2022. She is hemodynamically stable at ED.      Home Medications:  Prior to Admission medications   Not on File    Inpatient Medications: Scheduled Meds:  Continuous Infusions:  PRN Meds:   Allergies:    Allergies  Allergen Reactions   Penicillins Other (See Comments)    Social History:   Social History   Socioeconomic History   Marital status: Single    Spouse name: Not on file   Number of children: Not on file   Years of education: Not on file   Highest education level: Not on file  Occupational History   Not on file  Tobacco Use   Smoking status: Not on file   Smokeless tobacco: Not on file  Substance and Sexual Activity   Alcohol use: Not on file   Drug use: Not on file   Sexual activity: Not on file  Other Topics Concern   Not on file  Social History Narrative   Not on file   Social Determinants of Health   Financial Resource Strain: Not on file  Food Insecurity: Not on file  Transportation Needs: Not on file  Physical Activity: Not on file  Stress: Not on file  Social Connections: Not on file  Intimate Partner Violence: Not on file    Family History:   Mother and father: Heart attack  ROS:  Constitutional: fatigued  Eyes: Denied vision change or loss Ears/Nose/Mouth/Throat: Denied ear ache, sore throat, coughing, sinus pain Cardiovascular: see HPI  Respiratory: denied shortness of breath Gastrointestinal: Denied nausea, vomiting, abdominal pain,  diarrhea Genital/Urinary: Denied dysuria, hematuria, urinary frequency/urgency Musculoskeletal: Denied muscle ache, joint pain, weakness Skin: Denied rash, wound Neuro: Denied headache, dizziness, syncope Psych: Denied history of depression/anxiety  Endocrine: history of diabetes   Physical Exam/Data:   Vitals:   04/05/22 1111 04/05/22 1115 04/05/22 1130 04/05/22 1145  BP:  134/67 101/64 105/63  Pulse:  75 66 61  Resp:  15 16 (!) 24  Temp:  98.1 F (36.7 C)    TempSrc:  Oral    SpO2:  100% 100% 99%  Weight: 115.7 kg     Height: 5\' 11"  (1.803 m)      No intake or output data in the 24 hours ending 04/05/22 1212    04/05/2022   11:11 AM  Last 3 Weights  Weight (lbs) 255 lb  Weight (kg) 115.667 kg     Body mass index is  35.57 kg/m.   Vitals:  Vitals:   04/05/22 1130 04/05/22 1145  BP: 101/64 105/63  Pulse: 66 61  Resp: 16 (!) 24  Temp:    SpO2: 100% 99%   General Appearance: In no apparent distress, laying in bed, obese  HEENT: Normocephalic, atraumatic.  Neck: Supple, trachea midline, no JVDs Cardiovascular: Regular rate and rhythm, normal S1-S2,  no murmur Respiratory: Resting breathing unlabored, lungs sounds clear to auscultation bilaterally, no use of accessory muscles. On room air.  No wheezes, rales or rhonchi.   Gastrointestinal: Bowel sounds positive, abdomen soft Extremities: Able to move all extremities in bed without difficulty, no edema of BLE  Musculoskeletal: Normal muscle bulk and tone Skin: Intact, warm, dry. No rashes or petechiae noted in exposed areas.  Neurologic: Alert, oriented to person, place and time. Fluent speech, no facial droop, no cognitive deficit, no pronator drift, no gross sensory deficit, no focal muscle weakness, no gross focal neuro deficit Psychiatric: Tearful, mild anxiety      EKG:  The EKG was personally reviewed and demonstrates: Sinus rhythm with old TWI of III and V1 through V4  Telemetry:  Telemetry was personally  reviewed and demonstrates:  sinus rhythm 60-70s   Relevant CV Studies:  Cardiac catheterization 01/20/2022:     Prox LAD to Mid LAD lesion is 20% stenosed.   Prox Cx to Mid Cx lesion is 50% stenosed.   Ost RCA to Prox RCA lesion is 45% stenosed.   Mid RCA to Dist RCA lesion is 60% stenosed.   RV Branch lesion is 50% stenosed.   Non-stenotic Mid Cx to Dist Cx lesion was previously treated.   Non-stenotic Dist RCA lesion was previously treated.   Non-stenotic Prox RCA to Mid RCA lesion was previously treated.   Non-stenotic RPDA lesion was previously treated.   1.  Severe vasospasm ostial RCA, resolved 2.  Preserved left ventricular function   Recommendations   1.  Medical therapy 2.  Continue isosorbide mononitrate 3.  Add Cardizem CD 120 mg daily  Diagnostic Dominance: Right    Echo from 10/12/21:    1. Left ventricular ejection fraction, by estimation, is 60 to 65%. The  left ventricle has normal function. The left ventricle has no regional  wall motion abnormalities. Left ventricular diastolic parameters are  consistent with Grade I diastolic  dysfunction (impaired relaxation).   2. Right ventricular systolic function is normal. The right ventricular  size is normal.   3. The mitral valve is normal in structure. Trivial mitral valve  regurgitation. No evidence of mitral stenosis.   4. The aortic valve is normal in structure. Aortic valve regurgitation is  trivial. No aortic stenosis is present.   5. The inferior vena cava is normal in size with greater than 50%  respiratory variability, suggesting right atrial pressure of 3 mmHg.    Laboratory Data:  High Sensitivity Troponin:   Recent Labs  Lab 04/05/22 0936  TROPONINIHS 623*      Chemistry Recent Labs  Lab 04/05/22 0936  NA 136  K 3.9  CL 110  CO2 20*  GLUCOSE 93  BUN 11  CREATININE 1.22*  CALCIUM 9.0  GFRNONAA 57*  ANIONGAP 6     No results for input(s): "PROT", "ALBUMIN", "AST", "ALT",  "ALKPHOS", "BILITOT" in the last 168 hours. Lipids  Recent Labs  Lab 04/05/22 0938  CHOL 200  TRIG 58  HDL 40*  LDLCALC 148*  CHOLHDL 5.0     Hematology Recent Labs  Lab 04/05/22 0936  WBC 11.1*  RBC 4.21  HGB 10.8*  HCT 33.0*  MCV 78.4*  MCH 25.7*  MCHC 32.7  RDW 16.9*  PLT 280    Thyroid No results for input(s): "TSH", "FREET4" in the last 168 hours.  BNPNo results for input(s): "BNP", "PROBNP" in the last 168 hours.  DDimer No results for input(s): "DDIMER" in the last 168 hours.   Radiology/Studies:  DG Chest Portable 1 View  Result Date: 04/05/2022 CLINICAL DATA:  Chest pain. EXAM: PORTABLE CHEST 1 VIEW COMPARISON:  12/07/21 CXR FINDINGS: The heart size and mediastinal contours are within normal limits. Both lungs are clear. The visualized skeletal structures are unremarkable. IMPRESSION: No active disease. Electronically Signed   By: Marin Roberts M.D.   On: 04/05/2022 09:08     Assessment and Plan:   NSTEMI CAD with multiple DES to LAD, Lcx, and RCA (last PCI 10/12/21) Coronary vasospasm (noted on cath 01/20/22) - presented with resting chest pain started 2:30 AM on 04/05/22, improves with Nitro, with jaw and left arm paresthesia  - EKG in the field by EMS was concerning for inferior and lateral STE - EKG repeat here with SR and old TWI of III and V1-V4 - Hs trop 623, trend to peak   - LDL 148 , HDL 40 - will add A1C and TSH, update Echo  - ischemic evaluation with cardiac cath today, NPO, start gentle IVF hydration, renal index mildly elevated, Cr baseline around 1 now 1.22  - medical therapy: recommend start heparin gtt, nitro gtt, continue DAPT with ASA and prasugrel, Lipitor 80 mg, carvedilol 25 mg twice daily, hold isosorbide mononitrate 120 mg daily, continue ranolazine 1000 mg twice daily, hold enalapril 20mg  daily.   HLD - LDL 148, goal <70 - on lipitor 80mg  daily, will add zetia 10mg  daily  - outpatient referral to lipid clinic   HTN - BP is  controlled on coreg, imdur, enalapril, and aldactone at home, see changes as above   Type 2 DM - check A1C, self reports no longer diabetic and off meds PTA  - start insulin SSI AC while in house - [facility] diet once resume PO   Hx of tobacco use  - no longer smoking    Shared Decision Making/Informed Consent{   The risks [stroke (1 in 1000), death (1 in 1000), kidney failure [usually temporary] (1 in 500), bleeding (1 in 200), allergic reaction [possibly serious] (1 in 200)], benefits (diagnostic support and management of coronary artery disease) and alternatives of a cardiac catheterization were discussed in detail with Sonya [name] and she is willing to proceed.     Risk Assessment/Risk Scores:     TIMI Risk Score for Unstable Angina or Non-ST Elevation MI:   The patient's TIMI risk score is 5, which indicates a 26% risk of all cause mortality, new or recurrent myocardial infarction or need for urgent revascularization in the next 14 days.{  Severity of Illness: The appropriate patient status for this patient is INPATIENT. Inpatient status is judged to be reasonable and necessary in order to provide the required intensity of service to ensure the patient's safety. The patient's presenting symptoms, physical exam findings, and initial radiographic and laboratory data in the context of their chronic comorbidities is felt to place them at high risk for further clinical deterioration. Furthermore, it is not anticipated that the patient will be medically stable for discharge from the hospital within 2 midnights of admission.   * I certify that  at the point of admission it is my clinical judgment that the patient will require inpatient hospital care spanning beyond 2 midnights from the point of admission due to high intensity of service, high risk for further deterioration and high frequency of surveillance required.*   For questions or updates, please contact [place] Please  consult [email or web address] for contact info under    Signed, Margie Billet, NP  04/05/2022 12:12 PM

## 2022-04-05 NOTE — Progress Notes (Signed)
Small hematoma noted of right radial site, mild tenderness, radial pulse 2+, no motor deficit, able to sense touch, advised staff to keep TR band for another 30 minutes before releasing air, monitor for bleeding and hematoma expansion.

## 2022-04-05 NOTE — Progress Notes (Signed)
Patient returned to 4E from the Cath Lab. Vitals taken and stable. Right arm hematoma noted on transfer. BP cuff placed to hold pressure on arm. Will continue to monitor arm and patient informed to let RN know if any new pain. Call bell within reach. [place] C Terin Dierolf

## 2022-04-05 NOTE — ED Notes (Signed)
Pt called out to nurse's station for increasing CP. Repeat EKG performed and given to Dr. Langston Masker. SL nitro ordered and offered to pt, but she wanted to hold medication for now. I informed her that I can ask the doctor to re-order if she changes her mind or if pain increases.

## 2022-04-05 NOTE — ED Notes (Signed)
Admitting MD notified about pt's bed request being tele and need to change due to nitro infusions. Awaiting new orders.

## 2022-04-05 NOTE — Consult Note (Deleted)
Cardiology Consultation   Patient ID: Sonya [name] MRN: [id number]; DOB: 07-09-1980  Admit date: 04/05/2022 Date of Consult: 04/05/2022  PCP:  Patient, No Pcp Per   [facility] Cardiologist:   Neoma Laming, MD      Patient Profile:   Sonya [name] is a 41 y.o. female with a hx of CAD s/p multiple PCI's to LAD, LCx, RCA, HTN, hyperlipidemia, type 2 diabetes mellitus, tobacco use,  who is being seen 04/05/2022 for the evaluation of chest pain at the request of Dr Langston Masker.  History of Present Illness:   Sonya [name] with above PMH presented to ER for c/o chest pain.  From merged chart review, she historically follows Dr. Humphrey Rolls since 2019 for CAD, had underwent mutiple cardiac cath and interventions in the past.   She was initially hospitalized for NSTEMI on 06/08/18, cardiac cath showed 90-95% proximal LAD disease where she was terated with DES. Echo from 06/08/18 showed LVEF 65%.    She had repeat cardiac cath on 06/15/20 showed high grade distal RCA lesion but at bifurcation with PDA/PLV, she was recommended medical therapy.     She was hospitlaized on 04/14/21 for unstabel angina, reportedly had high risk NM stress on 11/03/20 prior to that. Cardiac cath 04/14/21 showed 75% mid Cx stenosis,  she was treated DES,  there was 55% distal RCA stenosis, 30% stenosis of prox to mid LAD, LVEF was 55-65% by estimate with normal LVEDP.   She was hospitalized again 10/11/21 and saw Dr End for inferior STEMI, emergent cath confirmed thrombotic subtotal occlusion of the mid RCA secondary to acute plaque rupture, patent prox LAD stent with 20% ISR, patent mid/distal LCx stent.  She was treated with DES x 2 to mid RCA and distal RCA. She was recommended switching from clopidogrel to prasugrel, while continue ASA 81mg  for total 12 month.  LVEDP was 67mm Hg. Echo 10/12/21 showed LVEF 60-65%, grade I DD, normal RV, trivial MR and AR.  She was discharged on medical therapy with  aspirin, prasugrel, carvedilol, Lipitor, Imdur, ranolazine.   She was hospitlaized last on 12/08/2021 for chest pain and saw Dr. Humphrey Rolls, pain was felt atypical, had negative troponin with ACS ruled out.  She was advised increasing isosorbide to twice daily dosing and outpatient follow-up with cardiology.  She was discharged on aspirin 81 mg, prasugrel10 mg daily, Lipitor 80 mg, carvedilol 25 mg twice daily, isosorbide mononitrate 120 mg daily, ranolazine 1000 mg twice daily, enalapril 20mg  daily.   Her last cardiac cath was 01/20/2022 by Dr. Humphrey Rolls for chest pain,  which revealed 99% stenosis ostial to proximal RCA lesion.  LVEDP was normal.  LVEF 50 to 55% by estimate.  PCI was proceeded, there was severe vasospasm of ostial RCA during diagnostic cath which was felt the cause of acute occlusion.  She was continued on Imdur and added diltiazem CD120 mg daily.  She presented to the ER today reporting recurrent onset of chest pain starting 230 AM. She was at work during non-exertional task.  She describes central chest pressure feeling with associated jaw and left arm tingling and numbness.  She initially thought this was heart burn. Pain is similar to previous Mis and vasospasm episode. She took Nitro and it seemed relieved the pain to lesser degree.  This morning around 6 or 7 AM, she finished work and returned home, she had worsened chest discomfort where she called EMS. EKG was performed in the field concerning for ST elevations of  inferior and lateral leads, where code STEMI was called.  She was given 2 tablets of nitroglycerin by EMS as well as full dose aspirin.  She reports improved chest pain after nitroglycerin.  Code STEMI was canceled upon reviewing EKG by cardiology MD. Cardiology consult is now requested for further evaluation of chest pain. She states her chest pain is mild at 4/10 now after receiving another dose of SL Nitro at ED. She denied any dizziness, syncope, SOB, peripheral edema, rapid weight  gain. She takes all of her medication daily. She quit smoking in last Oct. She states she is no longer diabetic and nor taking metformin anymore.    Diagnostic work-up so far revealed mild leukocytosis with WBC 11,100, anemia with hemoglobin 10.8.  BMP with Cr 1.22 and GFR 57.  INR 1.1.  hCG negative.  High sensitive troponin 623. HDL 40 and LDL 148.  Chest x-ray revealed no acute disease.  EKG revealed sinus rhythm with ventricular rate of 60bpm, TWI of lead III, V1-V4, similar to previous EKG on 01/20/2022. She is hemodynamically stable at ED.      Home Medications:  Prior to Admission medications   Not on File    Inpatient Medications: Scheduled Meds:  Continuous Infusions:  PRN Meds:   Allergies:    Allergies  Allergen Reactions   Penicillins Other (See Comments)    Social History:   Social History   Socioeconomic History   Marital status: Single    Spouse name: Not on file   Number of children: Not on file   Years of education: Not on file   Highest education level: Not on file  Occupational History   Not on file  Tobacco Use   Smoking status: Not on file   Smokeless tobacco: Not on file  Substance and Sexual Activity   Alcohol use: Not on file   Drug use: Not on file   Sexual activity: Not on file  Other Topics Concern   Not on file  Social History Narrative   Not on file   Social Determinants of Health   Financial Resource Strain: Not on file  Food Insecurity: Not on file  Transportation Needs: Not on file  Physical Activity: Not on file  Stress: Not on file  Social Connections: Not on file  Intimate Partner Violence: Not on file    Family History:   Mother and father: Heart attack  ROS:  Constitutional: fatigued  Eyes: Denied vision change or loss Ears/Nose/Mouth/Throat: Denied ear ache, sore throat, coughing, sinus pain Cardiovascular: see HPI  Respiratory: denied shortness of breath Gastrointestinal: Denied nausea, vomiting, abdominal pain,  diarrhea Genital/Urinary: Denied dysuria, hematuria, urinary frequency/urgency Musculoskeletal: Denied muscle ache, joint pain, weakness Skin: Denied rash, wound Neuro: Denied headache, dizziness, syncope Psych: Denied history of depression/anxiety  Endocrine: history of diabetes   Physical Exam/Data:   Vitals:   04/05/22 1030 04/05/22 1045 04/05/22 1100 04/05/22 1111  BP: 112/71 109/66 96/73   Pulse: 71 62 65   Resp: (!) 24 20 17    Temp:      TempSrc:      SpO2: 100% 100% 100%   Weight:    115.7 kg  Height:    5\' 11"  (1.803 m)   No intake or output data in the 24 hours ending 04/05/22 1115    04/05/2022   11:11 AM  Last 3 Weights  Weight (lbs) 255 lb  Weight (kg) 115.667 kg     Body mass index is 35.57 kg/m.  Vitals:  Vitals:   04/05/22 1045 04/05/22 1100  BP: 109/66 96/73  Pulse: 62 65  Resp: 20 17  Temp:    SpO2: 100% 100%   General Appearance: In no apparent distress, laying in bed, obese  HEENT: Normocephalic, atraumatic.  Neck: Supple, trachea midline, no JVDs Cardiovascular: Regular rate and rhythm, normal S1-S2,  no murmur Respiratory: Resting breathing unlabored, lungs sounds clear to auscultation bilaterally, no use of accessory muscles. On room air.  No wheezes, rales or rhonchi.   Gastrointestinal: Bowel sounds positive, abdomen soft Extremities: Able to move all extremities in bed without difficulty, no edema of BLE  Musculoskeletal: Normal muscle bulk and tone Skin: Intact, warm, dry. No rashes or petechiae noted in exposed areas.  Neurologic: Alert, oriented to person, place and time. Fluent speech, no facial droop, no cognitive deficit, no pronator drift, no gross sensory deficit, no focal muscle weakness, no gross focal neuro deficit Psychiatric: Tearful, mild anxiety      EKG:  The EKG was personally reviewed and demonstrates: Sinus rhythm with old TWI of III and V1 through V4  Telemetry:  Telemetry was personally reviewed and demonstrates:   sinus rhythm 60-70s   Relevant CV Studies:  Cardiac catheterization 01/20/2022:     Prox LAD to Mid LAD lesion is 20% stenosed.   Prox Cx to Mid Cx lesion is 50% stenosed.   Ost RCA to Prox RCA lesion is 45% stenosed.   Mid RCA to Dist RCA lesion is 60% stenosed.   RV Branch lesion is 50% stenosed.   Non-stenotic Mid Cx to Dist Cx lesion was previously treated.   Non-stenotic Dist RCA lesion was previously treated.   Non-stenotic Prox RCA to Mid RCA lesion was previously treated.   Non-stenotic RPDA lesion was previously treated.   1.  Severe vasospasm ostial RCA, resolved 2.  Preserved left ventricular function   Recommendations   1.  Medical therapy 2.  Continue isosorbide mononitrate 3.  Add Cardizem CD 120 mg daily  Diagnostic Dominance: Right    Echo from 10/12/21:    1. Left ventricular ejection fraction, by estimation, is 60 to 65%. The  left ventricle has normal function. The left ventricle has no regional  wall motion abnormalities. Left ventricular diastolic parameters are  consistent with Grade I diastolic  dysfunction (impaired relaxation).   2. Right ventricular systolic function is normal. The right ventricular  size is normal.   3. The mitral valve is normal in structure. Trivial mitral valve  regurgitation. No evidence of mitral stenosis.   4. The aortic valve is normal in structure. Aortic valve regurgitation is  trivial. No aortic stenosis is present.   5. The inferior vena cava is normal in size with greater than 50%  respiratory variability, suggesting right atrial pressure of 3 mmHg.    Laboratory Data:  High Sensitivity Troponin:   Recent Labs  Lab 04/05/22 0936  TROPONINIHS 623*     Chemistry Recent Labs  Lab 04/05/22 0936  NA 136  K 3.9  CL 110  CO2 20*  GLUCOSE 93  BUN 11  CREATININE 1.22*  CALCIUM 9.0  GFRNONAA 57*  ANIONGAP 6    No results for input(s): "PROT", "ALBUMIN", "AST", "ALT", "ALKPHOS", "BILITOT" in the last  168 hours. Lipids  Recent Labs  Lab 04/05/22 0938  CHOL 200  TRIG 58  HDL 40*  LDLCALC 148*  CHOLHDL 5.0    Hematology Recent Labs  Lab 04/05/22 0936  WBC 11.1*  RBC  4.21  HGB 10.8*  HCT 33.0*  MCV 78.4*  MCH 25.7*  MCHC 32.7  RDW 16.9*  PLT 280   Thyroid No results for input(s): "TSH", "FREET4" in the last 168 hours.  BNPNo results for input(s): "BNP", "PROBNP" in the last 168 hours.  DDimer No results for input(s): "DDIMER" in the last 168 hours.   Radiology/Studies:  DG Chest Portable 1 View  Result Date: 04/05/2022 CLINICAL DATA:  Chest pain. EXAM: PORTABLE CHEST 1 VIEW COMPARISON:  12/07/21 CXR FINDINGS: The heart size and mediastinal contours are within normal limits. Both lungs are clear. The visualized skeletal structures are unremarkable. IMPRESSION: No active disease. Electronically Signed   By: Lorenza CambridgeHemant  Desai M.D.   On: 04/05/2022 09:08     Assessment and Plan:   NSTEMI CAD with multiple DES to LAD, Lcx, and RCA (last PCI 10/12/21) Coronary vasospasm (noted on cath 01/20/22) - presented with resting chest pain started 2:30 AM on 04/05/22, improves with Nitro, with jaw and left arm paresthesia  - EKG in the field by EMS was concerning for inferior and lateral STE - EKG repeat here with SR and old TWI of III and V1-V4 - Hs trop 623, trend to peak   - LDL 148 , HDL 40 - will add A1C and TSH, update Echo  - ischemic evaluation with cardiac cath today, NPO, start gentle IVF hydration, renal index mildly elevated, Cr baseline around 1 now 1.22  - medical therapy: recommend start heparin gtt, continue DAPT with ASA and prasugrel, Lipitor 80 mg, carvedilol 25 mg twice daily, isosorbide mononitrate 120 mg daily, ranolazine 1000 mg twice daily, enalapril 20mg  daily.   HLD - LDL 148, goal <70 - on lipitor 80mg  daily, will add zetia 10mg  daily  - outpatient referral to lipid clinic   HTN - BP is controlled on coreg, imdur, enalapril, and aldactone at home   Type  2 DM - check A1C, self reports no longer diabetic and off meds PTA  - start insulin SSI AC while in house - [facility] once resume PO   Hx of tobacco use  - no longer smoking    Shared Decision Making/Informed Consent{   The risks [stroke (1 in 1000), death (1 in 1000), kidney failure [usually temporary] (1 in 500), bleeding (1 in 200), allergic reaction [possibly serious] (1 in 200)], benefits (diagnostic support and management of coronary artery disease) and alternatives of a cardiac catheterization were discussed in detail with Ms. Bodner and she is willing to proceed.     Risk Assessment/Risk Scores:     TIMI Risk Score for Unstable Angina or Non-ST Elevation MI:   The patient's TIMI risk score is 5, which indicates a 26% risk of all cause mortality, new or recurrent myocardial infarction or need for urgent revascularization in the next 14 days.{  Severity of Illness: The appropriate patient status for this patient is INPATIENT. Inpatient status is judged to be reasonable and necessary in order to provide the required intensity of service to ensure the patient's safety. The patient's presenting symptoms, physical exam findings, and initial radiographic and laboratory data in the context of their chronic comorbidities is felt to place them at high risk for further clinical deterioration. Furthermore, it is not anticipated that the patient will be medically stable for discharge from the hospital within 2 midnights of admission.   * I certify that at the point of admission it is my clinical judgment that the patient will require inpatient hospital care  spanning beyond 2 midnights from the point of admission due to high intensity of service, high risk for further deterioration and high frequency of surveillance required.*   For questions or updates, please contact  [facility] Please consult [email or web address] for contact info under    Signed, Cyndi Bender, NP  04/05/2022 11:15  AM

## 2022-04-05 NOTE — ED Triage Notes (Signed)
Pt arrives via [facility] for chest pain. Pt states that she started having midsternal CP causing L arm numbness and SOB. Pt has hx of multiple cardiac stents, most recent in July. Pt received 2 nitro enroute with relief and 324 mg ASA PTA.   Initially called a Code STEMI, but canceled by MD enroute.

## 2022-04-05 NOTE — ED Notes (Signed)
Date and time results received: 04/05/22 11:10 AM  (use smartphrase ".now" to insert current time)  Test: Troponin Critical Value: 623   Name of Provider Notified: Dr. Langston Masker  Orders Received? Or Actions Taken?: Orders Received - See Orders for details

## 2022-04-05 NOTE — Interval H&P Note (Signed)
Cath Lab Visit (complete for each Cath Lab visit)  Clinical Evaluation Leading to the Procedure:   ACS: Yes.    Non-ACS:    Anginal Classification: CCS IV  Anti-ischemic medical therapy: Minimal Therapy (1 class of medications)  Non-Invasive Test Results: No non-invasive testing performed  Prior CABG: No previous CABG      History and Physical Interval Note:  04/05/2022 5:19 PM  Sonya [name]  has presented today for surgery, with the diagnosis of nstemi.  The various methods of treatment have been discussed with the patient and family. After consideration of risks, benefits and other options for treatment, the patient has consented to  Procedure(s): LEFT HEART CATH AND CORONARY ANGIOGRAPHY (N/A) as a surgical intervention.  The patient's history has been reviewed, patient examined, no change in status, stable for surgery.  I have reviewed the patient's chart and labs.  Questions were answered to the patient's satisfaction.     Larae Grooms

## 2022-04-05 NOTE — Progress Notes (Signed)
Patient arrived to 4E from ED. Vitals taken and stable. Patient placed on tele and CCMD notified. Patient oriented to unit and staff. Cath Lab called right as patient was transferring to take to procedure. Call bell within reach.  Sonya [name]

## 2022-04-06 ENCOUNTER — Encounter (HOSPITAL_COMMUNITY): Payer: Self-pay | Admitting: Interventional Cardiology

## 2022-04-06 ENCOUNTER — Other Ambulatory Visit (HOSPITAL_COMMUNITY): Payer: Self-pay

## 2022-04-06 ENCOUNTER — Encounter: Payer: Self-pay | Admitting: Cardiovascular Disease

## 2022-04-06 ENCOUNTER — Inpatient Hospital Stay (HOSPITAL_COMMUNITY): Payer: BC Managed Care – PPO

## 2022-04-06 DIAGNOSIS — R079 Chest pain, unspecified: Secondary | ICD-10-CM

## 2022-04-06 LAB — BASIC METABOLIC PANEL
Anion gap: 7 (ref 5–15)
BUN: 12 mg/dL (ref 6–20)
CO2: 19 mmol/L — ABNORMAL LOW (ref 22–32)
Calcium: 8.5 mg/dL — ABNORMAL LOW (ref 8.9–10.3)
Chloride: 110 mmol/L (ref 98–111)
Creatinine, Ser: 1.3 mg/dL — ABNORMAL HIGH (ref 0.44–1.00)
GFR, Estimated: 53 mL/min — ABNORMAL LOW (ref >60)
Glucose, Bld: 103 mg/dL — ABNORMAL HIGH (ref 70–99)
Potassium: 3.8 mmol/L (ref 3.5–5.1)
Sodium: 136 mmol/L (ref 135–145)

## 2022-04-06 LAB — ECHOCARDIOGRAM COMPLETE
Area-P 1/2: 1.87 cm2
Height: 71 in
S' Lateral: 3 cm
Weight: 4080 oz

## 2022-04-06 LAB — HIV ANTIBODY (ROUTINE TESTING W REFLEX): HIV Screen 4th Generation wRfx: NONREACTIVE

## 2022-04-06 LAB — GLUCOSE, CAPILLARY: Glucose-Capillary: 176 mg/dL — ABNORMAL HIGH (ref 70–99)

## 2022-04-06 MED ORDER — CARVEDILOL 12.5 MG PO TABS
12.5000 mg | ORAL_TABLET | Freq: Two times a day (BID) | ORAL | 2 refills | Status: DC
  Filled 2022-04-06: qty 60, 30d supply, fill #0
  Filled 2022-08-09: qty 60, 30d supply, fill #1

## 2022-04-06 MED ORDER — ATORVASTATIN CALCIUM 80 MG PO TABS
80.0000 mg | ORAL_TABLET | Freq: Every day | ORAL | 2 refills | Status: DC
  Filled 2022-04-06: qty 30, 30d supply, fill #0
  Filled 2022-08-09: qty 30, 30d supply, fill #1

## 2022-04-06 MED ORDER — PERFLUTREN LIPID MICROSPHERE
1.0000 mL | INTRAVENOUS | Status: DC | PRN
  Administered 2022-04-06: 2 mL via INTRAVENOUS

## 2022-04-06 MED ORDER — ISOSORBIDE MONONITRATE ER 60 MG PO TB24
120.0000 mg | ORAL_TABLET | Freq: Every day | ORAL | 2 refills | Status: DC
  Filled 2022-04-06: qty 60, 30d supply, fill #0
  Filled 2022-08-09: qty 60, 30d supply, fill #1

## 2022-04-06 MED ORDER — CARVEDILOL 12.5 MG PO TABS: 12.5000 mg | ORAL_TABLET | Freq: Two times a day (BID) | ORAL | Status: DC

## 2022-04-06 MED ORDER — AMLODIPINE BESYLATE 5 MG PO TABS
5.0000 mg | ORAL_TABLET | Freq: Every day | ORAL | 2 refills | Status: DC
  Filled 2022-04-06: qty 30, 30d supply, fill #0
  Filled 2022-08-09: qty 30, 30d supply, fill #1

## 2022-04-06 NOTE — Progress Notes (Signed)
  Echocardiogram 2D Echocardiogram has been performed.  Sonya [name] 04/06/2022, 12:29 PM

## 2022-04-06 NOTE — Progress Notes (Signed)
04/06/2022 4:08 PM Discharge AVS meds taken today and those due this evening reviewed.  Follow-up appointments and when to call md reviewed.  D/C IV and TELE.  Questions and concerns addressed.   D/C home per orders.  Carney Corners

## 2022-04-06 NOTE — Progress Notes (Signed)
CARDIAC REHAB PHASE I   PRE:  Rate/Rhythm: 70 SR  BP:  Sitting: 112/66   SaO2:  not taken  MODE:  Ambulation: 470 ft   POST:  Rate/Rhythm: 65 SR  BP:  Sitting: 112/86   SaO2: not taken  Pt received in bed, agreeable to ambulation and education. Pt ambulated in hallway independently with standby assist, tolerated well with no s/s. Pt returned to bed and educated on MI booklet, risk factors, angina/ NTG, restrictions, nutrition, and orientation to [place] referral to [facility]. Pt receptive to education, all questions answered, and pt left in bed with call bell in reach.  [place], [place] 04/06/2022 10:34 AM

## 2022-04-06 NOTE — Discharge Summary (Signed)
Discharge Summary    Patient ID: Sonya [name] MRN: [id number]; DOB: 04/09/1981  Admit date: 04/05/2022 Discharge date: 04/06/2022  PCP:  Patient, No Pcp Per   [place] Providers Cardiologist:  None        Discharge Diagnoses    Principal Problem:   NSTEMI (non-ST elevated myocardial infarction) [facility])    Diagnostic Studies/Procedures    04/06/22 TTE  IMPRESSIONS     1. Left ventricular ejection fraction, by estimation, is 65 to 70%. The  left ventricle has normal function. The left ventricle has no regional  wall motion abnormalities. Left ventricular diastolic parameters were  normal.   2. Right ventricular systolic function is normal. The right ventricular  size is normal.   3. The mitral valve is normal in structure. No evidence of mitral valve  regurgitation. No evidence of mitral stenosis.   4. The aortic valve is tricuspid. Aortic valve regurgitation is not  visualized. No aortic stenosis is present.   5. The inferior vena cava is normal in size with greater than 50%  respiratory variability, suggesting right atrial pressure of 3 mmHg.   FINDINGS   Left Ventricle: Left ventricular ejection fraction, by estimation, is 65  to 70%. The left ventricle has normal function. The left ventricle has no  regional wall motion abnormalities. The left ventricular internal cavity  size was normal in size. There is   no left ventricular hypertrophy. Left ventricular diastolic parameters  were normal. Normal left ventricular filling pressure.   Right Ventricle: The right ventricular size is normal. No increase in  right ventricular wall thickness. Right ventricular systolic function is  normal.   Left Atrium: Left atrial size was normal in size.   Right Atrium: Right atrial size was normal in size.   Pericardium: There is no evidence of pericardial effusion.   Mitral Valve: The mitral valve is normal in structure. No evidence of  mitral valve  regurgitation. No evidence of mitral valve stenosis.   Tricuspid Valve: The tricuspid valve is normal in structure. Tricuspid  valve regurgitation is not demonstrated. No evidence of tricuspid  stenosis.   Aortic Valve: The aortic valve is tricuspid. Aortic valve regurgitation is  not visualized. No aortic stenosis is present.   Pulmonic Valve: The pulmonic valve was normal in structure. Pulmonic valve  regurgitation is trivial. No evidence of pulmonic stenosis.   Aorta: The aortic root is normal in size and structure.   Venous: The inferior vena cava is normal in size with greater than 50%  respiratory variability, suggesting right atrial pressure of 3 mmHg.   IAS/Shunts: No atrial level shunt detected by color flow Doppler.   04/05/22 LHC    Ost RCA to Prox RCA lesion is 30- 50% diffuse stenosis after significant improvement after IC NTG.  Initially, this was an 80% ostial stenosis.   Prox Cx to Mid Cx lesion is 25% stenosed.   Previously placed Prox LAD to Mid LAD stent of unknown type is  widely patent.   Previously placed Mid RCA stent of unknown type is  widely patent.   Previously placed Dist RCA stent of unknown type is  widely patent.   The left ventricular systolic function is normal.   LV end diastolic pressure is normal.   The left ventricular ejection fraction is 55-65% by visual estimate.   There is no aortic valve stenosis.   Coronary vasospasm of the ostial to proximal RCA.  Significant improvement after IC NTG.  Diagnostic Dominance: Right    _____________   History of Present Illness     Sonya [name] is a 41 y.o. female with a history of CAD s/p multiple PCI's to LAD, LCx, RCA, HTN, hyperlipidemia, type 2 diabetes mellitus, tobacco use. Patient presented to the ED with symptoms of chest pain that began during the night. At time of onset, patient was at work (not doing anything exertional). She began to experience central chest pressure with jaw  discomfort and left arm tingling/numbness. These symptoms were similar to patient's previous MI chest pain. Patient took nitro and felt relief. She returned home and again had worsening pain, called EMS> EKG with EMS concerning for ST elevation of inferior and lateral leads. STEMI activated but then cancelled by cardiology attending.    Hospital Course     Chest pain HTN  Patient continued to have relief of pain with nitroglycerin. Initial labs notable for an initial troponin of 623, trended to 1668 prior to cath. EKG showed stable T wave inversion in leads III, V1-V4. Patient given ASA and started on heparin/nitro infusions. Given symptoms and history, decision made to take patient to cath. LHC found ost RCA to Prox RCA lesion is 30- 50% diffuse stenosis after significant improvement after IC NTG.  Initially, this was an 80% ostial stenosis. Cath otherwise without significant change from 01/20/22. Given significant coronary vasospasm, recommendations given to increase anti-spasmatic regimen. Following LHC, patient without recurrence of chest pain. TTE with normal LV function.  Start Amlodipine 67m (replacing Diltiazem due to bradycardia) Resume Imdur 124mdaily Stop Aldactone and ACEi to allow medication titration. Coreg reduced to 12.61m70mID with bradycardia and to allow anti-spasmatic titration Continue Ranexa Follow up with primary cardiologist Dr. KhaHumphrey RollsHLD   Patient on Atorvastatin 7m33md Zetia in the past, reportedly has been receiving Praluent only since July. LDL of 148. Atorvastatin 7mg31mumed.   Type 2 DM   Patient reporting that she is no longer on antihyperglycemic regimen. A1c this admission 6.2. SSI while inpatient, defer management to outpatient provider.   Did the patient have an acute coronary syndrome (MI, NSTEMI, STEMI, etc) this admission?:  No                               Did the patient have a percutaneous coronary intervention (stent / angioplasty)?:  No.           Discharge Vitals Blood pressure (!) 107/57, pulse 63, temperature 98.2 F (36.8 C), temperature source Oral, resp. rate 18, height 5' 11"  (1.803 m), weight 115.7 kg, last menstrual period 03/22/2022, SpO2 99 %.  Filed Weights   04/05/22 1111  Weight: 115.7 kg    Labs & Radiologic Studies    CBC Recent Labs    04/05/22 0936  WBC 11.1*  NEUTROABS 7.8*  HGB 10.8*  HCT 33.0*  MCV 78.4*  PLT 280  938sic Metabolic Panel Recent Labs    04/05/22 0936 04/06/22 0101  NA 136 136  K 3.9 3.8  CL 110 110  CO2 20* 19*  GLUCOSE 93 103*  BUN 11 12  CREATININE 1.22* 1.30*  CALCIUM 9.0 8.5*   Liver Function Tests No results for input(s): "AST", "ALT", "ALKPHOS", "BILITOT", "PROT", "ALBUMIN" in the last 72 hours. No results for input(s): "LIPASE", "AMYLASE" in the last 72 hours. High Sensitivity Troponin:   Recent Labs  Lab 04/05/22 0936 04/05/22 1116  TROPONINIHS 623* 1,668*  BNP Invalid input(s): "POCBNP" D-Dimer No results for input(s): "DDIMER" in the last 72 hours. Hemoglobin A1C Recent Labs    04/05/22 0936  HGBA1C 6.2*   Fasting Lipid Panel Recent Labs    04/05/22 0938  CHOL 200  HDL 40*  LDLCALC 148*  TRIG 58  CHOLHDL 5.0   Thyroid Function Tests Recent Labs    04/05/22 1117  TSH 0.493   _____________  ECHOCARDIOGRAM COMPLETE  Result Date: 04/06/2022    ECHOCARDIOGRAM REPORT   Patient Name:   Sonya [name] Date of Exam: 04/06/2022 Medical Rec #:  [id number]       Height:       71.0 in Accession #:    [id number]      Weight:       255.0 lb Date of Birth:  1981-05-17       BSA:          2.338 m Patient Age:    10 years        BP:           91/55 mmHg Patient Gender: F               HR:           65 bpm. Exam Location:  Inpatient Procedure: 2D Echo, Cardiac Doppler and Color Doppler Indications:    Chest Pain  History:        Patient has prior history of Echocardiogram examinations, most                 recent 10/12/2021. CAD and Previous  Myocardial Infarction; Risk                 Factors:Hypertension, Diabetes and Dyslipidemia.  Sonographer:    Eartha Inch Referring Phys: Veverly Fells  Sonographer Comments: Technically difficult study due to poor echo windows. Image acquisition challenging due to patient body habitus and Image acquisition challenging due to respiratory motion. IMPRESSIONS  1. Left ventricular ejection fraction, by estimation, is 65 to 70%. The left ventricle has normal function. The left ventricle has no regional wall motion abnormalities. Left ventricular diastolic parameters were normal.  2. Right ventricular systolic function is normal. The right ventricular size is normal.  3. The mitral valve is normal in structure. No evidence of mitral valve regurgitation. No evidence of mitral stenosis.  4. The aortic valve is tricuspid. Aortic valve regurgitation is not visualized. No aortic stenosis is present.  5. The inferior vena cava is normal in size with greater than 50% respiratory variability, suggesting right atrial pressure of 3 mmHg. FINDINGS  Left Ventricle: Left ventricular ejection fraction, by estimation, is 65 to 70%. The left ventricle has normal function. The left ventricle has no regional wall motion abnormalities. The left ventricular internal cavity size was normal in size. There is  no left ventricular hypertrophy. Left ventricular diastolic parameters were normal. Normal left ventricular filling pressure. Right Ventricle: The right ventricular size is normal. No increase in right ventricular wall thickness. Right ventricular systolic function is normal. Left Atrium: Left atrial size was normal in size. Right Atrium: Right atrial size was normal in size. Pericardium: There is no evidence of pericardial effusion. Mitral Valve: The mitral valve is normal in structure. No evidence of mitral valve regurgitation. No evidence of mitral valve stenosis. Tricuspid Valve: The tricuspid valve is normal in  structure. Tricuspid valve regurgitation is not demonstrated. No evidence of tricuspid stenosis. Aortic Valve: The aortic valve is tricuspid. Aortic  valve regurgitation is not visualized. No aortic stenosis is present. Pulmonic Valve: The pulmonic valve was normal in structure. Pulmonic valve regurgitation is trivial. No evidence of pulmonic stenosis. Aorta: The aortic root is normal in size and structure. Venous: The inferior vena cava is normal in size with greater than 50% respiratory variability, suggesting right atrial pressure of 3 mmHg. IAS/Shunts: No atrial level shunt detected by color flow Doppler.  LEFT VENTRICLE PLAX 2D LVIDd:         5.10 cm   Diastology LVIDs:         3.00 cm   LV e' medial:    8.64 cm/s LV PW:         1.00 cm   LV E/e' medial:  5.9 LV IVS:        1.00 cm   LV e' lateral:   10.40 cm/s LVOT diam:     2.20 cm   LV E/e' lateral: 4.9 LV SV:         91 LV SV Index:   39 LVOT Area:     3.80 cm  RIGHT VENTRICLE             IVC RV S prime:     12.00 cm/s  IVC diam: 1.60 cm TAPSE (M-mode): 2.0 cm LEFT ATRIUM             Index        RIGHT ATRIUM           Index LA diam:        3.50 cm 1.50 cm/m   RA Area:     11.40 cm LA Vol (A2C):   52.1 ml 22.28 ml/m  RA Volume:   24.60 ml  10.52 ml/m LA Vol (A4C):   30.5 ml 13.05 ml/m LA Biplane Vol: 42.1 ml 18.01 ml/m  AORTIC VALVE LVOT Vmax:   125.00 cm/s LVOT Vmean:  94.700 cm/s LVOT VTI:    0.239 m  AORTA Ao Root diam: 3.00 cm Ao Asc diam:  2.90 cm MITRAL VALVE MV Area (PHT): 1.87 cm    SHUNTS MV Decel Time: 405 msec    Systemic VTI:  0.24 m MV E velocity: 51.00 cm/s  Systemic Diam: 2.20 cm MV A velocity: 51.80 cm/s MV E/A ratio:  0.98 Skeet Latch MD Electronically signed by Skeet Latch MD Signature Date/Time: 04/06/2022/2:45:39 PM    Final    CARDIAC CATHETERIZATION  Result Date: 04/05/2022   Ost RCA to Prox RCA lesion is 30- 50% diffuse stenosis after significant improvement after IC NTG.  Initially, this was an 80% ostial  stenosis.   Prox Cx to Mid Cx lesion is 25% stenosed.   Previously placed Prox LAD to Mid LAD stent of unknown type is  widely patent.   Previously placed Mid RCA stent of unknown type is  widely patent.   Previously placed Dist RCA stent of unknown type is  widely patent.   The left ventricular systolic function is normal.   LV end diastolic pressure is normal.   The left ventricular ejection fraction is 55-65% by visual estimate.   There is no aortic valve stenosis. Coronary vasospasm of the ostial to proximal RCA.  Significant improvement after IC NTG. Will change diltiazem to amlodipine given her slower heart rates.  Plan to add Imdur to help prevent spasm.   DG Chest Portable 1 View  Result Date: 04/05/2022 CLINICAL DATA:  Chest pain. EXAM: PORTABLE CHEST 1 VIEW COMPARISON:  12/07/21 CXR FINDINGS:  The heart size and mediastinal contours are within normal limits. Both lungs are clear. The visualized skeletal structures are unremarkable. IMPRESSION: No active disease. Electronically Signed   By: Marin Roberts M.D.   On: 04/05/2022 09:08   Disposition   Pt is being discharged home today in good condition.  Follow-up Plans & Appointments     Follow-up Information     PCP. Schedule an appointment as soon as possible for a visit.   Why: Please make an appointment with a primary care provider.               Discharge Instructions     Amb Referral to Cardiac Rehabilitation   Complete by: As directed    Diagnosis: NSTEMI   After initial evaluation and assessments completed: Virtual Based Care may be provided alone or in conjunction with Phase 2 Cardiac Rehab based on patient barriers.: Yes   Intensive Cardiac Rehabilitation (ICR) [place] location only OR Traditional Cardiac Rehabilitation (TCR) *If criteria for ICR are not met will enroll in TCR [facility] only): Yes   Diet - low sodium heart healthy   Complete by: As directed    Discharge instructions   Complete by: As directed    NO HEAVY  LIFTING OR SEXUAL ACTIVITY X 7 DAYS. NO DRIVING X 2-3 DAYS. NO SOAKING BATHS, HOT TUBS, POOLS, ETC., X 7 DAYS.  Please schedule an appointment with Dr. Humphrey Rolls and with your primary care physician.   Increase activity slowly   Complete by: As directed         Discharge Medications   Allergies as of 04/06/2022       Reactions   Atorvastatin Other (See Comments)   Muscle spasms   Metoprolol Hives   Penicillins Other (See Comments)        Medication List     STOP taking these medications    diltiazem 120 MG 24 hr capsule Commonly known as: CARDIZEM CD   enalapril 20 MG tablet Commonly known as: VASOTEC   spironolactone 25 MG tablet Commonly known as: ALDACTONE       TAKE these medications    amLODipine 5 MG tablet Commonly known as: NORVASC Take 1 tablet (5 mg total) by mouth daily. Start taking on: April 07, 2022   aspirin EC 81 MG tablet Take 81 mg by mouth daily. Swallow whole.   atorvastatin 80 MG tablet Commonly known as: LIPITOR Take 1 tablet (80 mg total) by mouth daily. Start taking on: April 07, 2022   busPIRone 10 MG tablet Commonly known as: BUSPAR Take 10 mg by mouth 2 (two) times daily as needed for anxiety.   carvedilol 12.5 MG tablet Commonly known as: COREG Take 1 tablet (12.5 mg total) by mouth 2 (two) times daily with a meal. What changed:  medication strength how much to take when to take this   isosorbide mononitrate 60 MG 24 hr tablet Commonly known as: IMDUR Take 2 tablets (120 mg total) by mouth daily.   nitroGLYCERIN 0.4 MG SL tablet Commonly known as: NITROSTAT Place 0.4 mg under the tongue every 5 (five) minutes as needed for chest pain.   nystatin cream Commonly known as: MYCOSTATIN Apply 1 Application topically at bedtime as needed for rash.   pantoprazole 40 MG tablet Commonly known as: PROTONIX Take 40 mg by mouth 2 (two) times daily as needed (heartburn).   Praluent 75 MG/ML Soaj Generic drug:  Alirocumab Inject 75 mg into the skin every 14 (fourteen) days.  prasugrel 10 MG Tabs tablet Commonly known as: EFFIENT Take 10 mg by mouth daily.   ranolazine 1000 MG SR tablet Commonly known as: RANEXA Take 1,000 mg by mouth 2 (two) times daily.   Vitamin D 125 MCG (5000 UT) Caps Take 5,000 Units by mouth daily.           Outstanding Labs/Studies    Duration of Discharge Encounter   Greater than 30 minutes including physician time.  Delton Coombes, PA-C 04/06/2022, 3:01 PM

## 2022-04-06 NOTE — Progress Notes (Signed)
Rounding Note    Patient Name: Sonya [name] Date of Encounter: 04/06/2022  [facility] Cardiologist: Dr. Humphrey Rolls  Subjective   Patient resting comfortably in bed, denies any recurrence of chest pain. She has pain at her left arm IV site and says that this was just removed by nursing. She is curious about discharge plans.   Inpatient Medications    Scheduled Meds:  amLODipine  5 mg Oral Daily   aspirin EC  81 mg Oral Daily   atorvastatin  80 mg Oral Daily   busPIRone  10 mg Oral Q1400   carvedilol  25 mg Oral BID WC   cholecalciferol  5,000 Units Oral Daily   enalapril  20 mg Oral Daily   ezetimibe  10 mg Oral Daily   insulin aspart  0-6 Units Subcutaneous TID WC   prasugrel  10 mg Oral Daily   ranolazine  1,000 mg Oral BID   sodium chloride flush  3 mL Intravenous Q12H   spironolactone  25 mg Oral Daily   Continuous Infusions:  sodium chloride     nitroGLYCERIN 5 mcg/min (04/05/22 1708)   PRN Meds: sodium chloride, acetaminophen, busPIRone, nitroGLYCERIN, ondansetron (ZOFRAN) IV, pantoprazole, sodium chloride flush   Vital Signs    Vitals:   04/05/22 2000 04/05/22 2314 04/06/22 0325 04/06/22 0815  BP: 107/71 (!) 93/49 106/66 110/66  Pulse:  62 (!) 58 65  Resp: 18 18 18 20   Temp: 98.5 F (36.9 C) 98.2 F (36.8 C) 98.3 F (36.8 C) 98.6 F (37 C)  TempSrc: Oral Oral Oral Oral  SpO2:  99% 99% 99%  Weight:      Height:        Intake/Output Summary (Last 24 hours) at 04/06/2022 0847 Last data filed at 04/06/2022 0327 Gross per 24 hour  Intake 1070.73 ml  Output 1 ml  Net 1069.73 ml      04/05/2022   11:11 AM  Last 3 Weights  Weight (lbs) 255 lb  Weight (kg) 115.667 kg      Telemetry    Sinus bradycardia - Personally Reviewed  ECG    No new tracing today.  Physical Exam   GEN: No acute distress.   Neck: No JVD Cardiac: RRR, no murmurs, rubs, or gallops.  Respiratory: Clear to auscultation bilaterally. GI: Soft, nontender,  non-distended  MS: No edema; No deformity. Neuro:  Nonfocal  Psych: Normal affect   Labs    High Sensitivity Troponin:   Recent Labs  Lab 04/05/22 0936 04/05/22 1116  TROPONINIHS 623* 1,668*     Chemistry Recent Labs  Lab 04/05/22 0936 04/06/22 0101  NA 136 136  K 3.9 3.8  CL 110 110  CO2 20* 19*  GLUCOSE 93 103*  BUN 11 12  CREATININE 1.22* 1.30*  CALCIUM 9.0 8.5*  GFRNONAA 57* 53*  ANIONGAP 6 7    Lipids  Recent Labs  Lab 04/05/22 0938  CHOL 200  TRIG 58  HDL 40*  LDLCALC 148*  CHOLHDL 5.0    Hematology Recent Labs  Lab 04/05/22 0936  WBC 11.1*  RBC 4.21  HGB 10.8*  HCT 33.0*  MCV 78.4*  MCH 25.7*  MCHC 32.7  RDW 16.9*  PLT 280   Thyroid  Recent Labs  Lab 04/05/22 1117  TSH 0.493    BNPNo results for input(s): "BNP", "PROBNP" in the last 168 hours.  DDimer No results for input(s): "DDIMER" in the last 168 hours.   Radiology  CARDIAC CATHETERIZATION  Result Date: 04/05/2022   Ost RCA to Prox RCA lesion is 30- 50% diffuse stenosis after significant improvement after IC NTG.  Initially, this was an 80% ostial stenosis.   Prox Cx to Mid Cx lesion is 25% stenosed.   Previously placed Prox LAD to Mid LAD stent of unknown type is  widely patent.   Previously placed Mid RCA stent of unknown type is  widely patent.   Previously placed Dist RCA stent of unknown type is  widely patent.   The left ventricular systolic function is normal.   LV end diastolic pressure is normal.   The left ventricular ejection fraction is 55-65% by visual estimate.   There is no aortic valve stenosis. Coronary vasospasm of the ostial to proximal RCA.  Significant improvement after IC NTG. Will change diltiazem to amlodipine given her slower heart rates.  Plan to add Imdur to help prevent spasm.   DG Chest Portable 1 View  Result Date: 04/05/2022 CLINICAL DATA:  Chest pain. EXAM: PORTABLE CHEST 1 VIEW COMPARISON:  12/07/21 CXR FINDINGS: The heart size and mediastinal  contours are within normal limits. Both lungs are clear. The visualized skeletal structures are unremarkable. IMPRESSION: No active disease. Electronically Signed   By: Lorenza Cambridge M.D.   On: 04/05/2022 09:08    Cardiac Studies   04/05/2022 LHC    Ost RCA to Prox RCA lesion is 30- 50% diffuse stenosis after significant improvement after IC NTG.  Initially, this was an 80% ostial stenosis.   Prox Cx to Mid Cx lesion is 25% stenosed.   Previously placed Prox LAD to Mid LAD stent of unknown type is  widely patent.   Previously placed Mid RCA stent of unknown type is  widely patent.   Previously placed Dist RCA stent of unknown type is  widely patent.   The left ventricular systolic function is normal.   LV end diastolic pressure is normal.   The left ventricular ejection fraction is 55-65% by visual estimate.   There is no aortic valve stenosis.   Coronary vasospasm of the ostial to proximal RCA.  Significant improvement after IC NTG.   Diagnostic Dominance: Right   Cardiac catheterization 01/20/2022:      Prox LAD to Mid LAD lesion is 20% stenosed.   Prox Cx to Mid Cx lesion is 50% stenosed.   Ost RCA to Prox RCA lesion is 45% stenosed.   Mid RCA to Dist RCA lesion is 60% stenosed.   RV Branch lesion is 50% stenosed.   Non-stenotic Mid Cx to Dist Cx lesion was previously treated.   Non-stenotic Dist RCA lesion was previously treated.   Non-stenotic Prox RCA to Mid RCA lesion was previously treated.   Non-stenotic RPDA lesion was previously treated.   1.  Severe vasospasm ostial RCA, resolved 2.  Preserved left ventricular function   Recommendations   1.  Medical therapy 2.  Continue isosorbide mononitrate 3.  Add Cardizem CD 120 mg daily   Diagnostic Dominance: Right      Echo from 10/12/21:     1. Left ventricular ejection fraction, by estimation, is 60 to 65%. The  left ventricle has normal function. The left ventricle has no regional  wall motion abnormalities.  Left ventricular diastolic parameters are  consistent with Grade I diastolic  dysfunction (impaired relaxation).   2. Right ventricular systolic function is normal. The right ventricular  size is normal.   3. The mitral valve is normal in structure.  Trivial mitral valve  regurgitation. No evidence of mitral stenosis.   4. The aortic valve is normal in structure. Aortic valve regurgitation is  trivial. No aortic stenosis is present.   5. The inferior vena cava is normal in size with greater than 50%  respiratory variability, suggesting right atrial pressure of 3 mmHg.     Patient Profile     SAMIHA DENAPOLI is a 41 y.o. female with a hx of CAD s/p multiple PCI's to LAD, LCx, RCA, HTN, hyperlipidemia, type 2 diabetes mellitus, tobacco use,  who is being seen 04/05/2022 for the evaluation of chest pain at the request of Dr Renaye Rakers.  Assessment & Plan    NSTEMI CAD with multiple DES to LAD, Lcx, and RCA (last PCI 10/12/21) Coronary vasospasm (noted on cath 01/20/22)  Patient with chest pain at rest prompting call to EMS. EKG with known T wave inversions of III and V1-V4. Troponin 623, 1668. Now chest pain free.  LHC this admission showed, coronary vasospasm of the ostial to proximal RCA. Significant improvement after IC NTG. Otherwise relatively stable CAD, patent stents.  Continue DAPT with ASA and Prasugrel Decrease Coreg to 12.5mg  BID given bradycardia and to allow up-titration of Imdur. Plan transition back to Imdur from IV nitro Continue Ranolazine 1000mg  BID TTE pending today  HLD  Patient on Atorvastatin 80mg  and Zetia in the past, reportedly has been receiving Praluent only since July. LDL of 148. Atorvastatin resumed   HTN  Patient on Coreg 25mg  BID, Enalapril 20mg , Spironolactone 25mg . Diltiazem replaced with Amlodipine on 10/11 2/2 bradycardia. Decrease Coreg to 12.5mg  BID to allow for Imdur increase for vasospasm. May also need to adjust spironolactone.  Type 2  DM  Patient reporting that she is no longer on antihyperglycemic regimen. A1c this admission 6.2. Continue SSI while inpatient.    For questions or updates, please contact [facility] Please consult [email or web address] for contact info under        Signed, August, PA-C  04/06/2022, 8:47 AM

## 2022-04-07 LAB — LIPOPROTEIN A (LPA): Lipoprotein (a): 183 nmol/L — ABNORMAL HIGH (ref <75.0)

## 2022-04-20 ENCOUNTER — Ambulatory Visit (HOSPITAL_BASED_OUTPATIENT_CLINIC_OR_DEPARTMENT_OTHER): Payer: BC Managed Care – PPO | Admitting: Cardiology

## 2022-05-15 ENCOUNTER — Other Ambulatory Visit (HOSPITAL_COMMUNITY): Payer: Self-pay

## 2022-07-28 ENCOUNTER — Ambulatory Visit: Payer: Self-pay | Admitting: Nurse Practitioner

## 2022-08-09 ENCOUNTER — Other Ambulatory Visit: Payer: Self-pay

## 2022-08-09 ENCOUNTER — Other Ambulatory Visit: Payer: Self-pay | Admitting: Cardiovascular Disease

## 2022-08-10 ENCOUNTER — Other Ambulatory Visit: Payer: Self-pay

## 2022-08-11 ENCOUNTER — Encounter: Payer: Self-pay | Admitting: Cardiovascular Disease

## 2022-08-11 ENCOUNTER — Other Ambulatory Visit: Payer: Self-pay | Admitting: Cardiovascular Disease

## 2022-08-11 ENCOUNTER — Ambulatory Visit
Admission: RE | Admit: 2022-08-11 | Discharge: 2022-08-11 | Disposition: A | Discharge status: Home / Self Care | Payer: BC Managed Care – PPO | Source: Ambulatory Visit | Attending: Cardiovascular Disease | Admitting: Cardiovascular Disease

## 2022-08-11 ENCOUNTER — Ambulatory Visit (INDEPENDENT_AMBULATORY_CARE_PROVIDER_SITE_OTHER): Payer: Medicaid Other | Admitting: Cardiovascular Disease

## 2022-08-11 VITALS — BP 130/80 | HR 73 | Ht 71.0 in | Wt 271.4 lb

## 2022-08-11 DIAGNOSIS — I2510 Atherosclerotic heart disease of native coronary artery without angina pectoris: Secondary | ICD-10-CM

## 2022-08-11 DIAGNOSIS — E782 Mixed hyperlipidemia: Secondary | ICD-10-CM

## 2022-08-11 DIAGNOSIS — I82412 Acute embolism and thrombosis of left femoral vein: Secondary | ICD-10-CM

## 2022-08-11 DIAGNOSIS — I10 Essential (primary) hypertension: Secondary | ICD-10-CM

## 2022-08-11 MED ORDER — AMLODIPINE BESYLATE 5 MG PO TABS
5.0000 mg | ORAL_TABLET | Freq: Every day | ORAL | 2 refills | Status: DC
Start: 2022-08-11 — End: 2022-12-13

## 2022-08-11 NOTE — Progress Notes (Signed)
Cardiology Office Note   Date:  08/15/2022   ID:  Seela, Schilz Mar 08, 1981, MRN [id number]  PCP:  Evern Bio, NP  Cardiologist:  Neoma Laming, MD      History of Present Illness: Sonya [name] is a 42 y.o. female who presents for  Chief Complaint  Patient presents with   Leg Swelling    Pain    Follow-up    Echo/pain in leg and feet    Having pain in left leg  Leg Pain  The incident occurred more than 1 week ago. The pain is present in the left leg. The quality of the pain is described as burning. The pain is at a severity of 9/10. The pain is severe. The pain has been Constant since onset.      Past Medical History:  Diagnosis Date   Coronary artery disease    GERD (gastroesophageal reflux disease)    H/O heart artery stent    Tobacco abuse      Past Surgical History:  Procedure Laterality Date   CORONARY STENT INTERVENTION N/A 06/10/2018   Procedure: CORONARY STENT INTERVENTION;  Surgeon: Yolonda Kida, MD;  Location: [place] [facility];  Service: Cardiovascular;  Laterality: N/A;   CORONARY STENT INTERVENTION N/A 04/14/2021   Procedure: CORONARY STENT INTERVENTION;  Surgeon: Isaias Cowman, MD;  Location: [place] [facility];  Service: Cardiovascular;  Laterality: N/A;   CORONARY STENT INTERVENTION N/A 01/20/2022   Procedure: CORONARY STENT INTERVENTION;  Surgeon: Isaias Cowman, MD;  Location: [place] [facility];  Service: Cardiovascular;  Laterality: N/A;   CORONARY/GRAFT ACUTE MI REVASCULARIZATION N/A 10/11/2021   Procedure: Coronary/Graft Acute MI Revascularization;  Surgeon: Nelva Bush, MD;  Location: [place] [facility];  Service: Cardiovascular;  Laterality: N/A;   LEFT HEART CATH AND CORONARY ANGIOGRAPHY N/A 06/10/2018   Procedure: With coronary intervention and stenting;  Surgeon: Dionisio David, MD;  Location: [place] [facility];  Service: Cardiovascular;  Laterality: N/A;   LEFT HEART CATH AND CORONARY  ANGIOGRAPHY Left 06/15/2020   Procedure: LEFT HEART CATH AND CORONARY ANGIOGRAPHY;  Surgeon: Dionisio David, MD;  Location: [place] [facility];  Service: Cardiovascular;  Laterality: Left;   LEFT HEART CATH AND CORONARY ANGIOGRAPHY N/A 04/14/2021   Procedure: LEFT HEART CATH AND CORONARY ANGIOGRAPHY with intervention;  Surgeon: Dionisio David, MD;  Location: [place] [facility];  Service: Cardiovascular;  Laterality: N/A;   LEFT HEART CATH AND CORONARY ANGIOGRAPHY N/A 10/11/2021   Procedure: LEFT HEART CATH AND CORONARY ANGIOGRAPHY;  Surgeon: Nelva Bush, MD;  Location: [place] [facility];  Service: Cardiovascular;  Laterality: N/A;   LEFT HEART CATH AND CORONARY ANGIOGRAPHY N/A 01/20/2022   Procedure: LEFT HEART CATH AND CORONARY ANGIOGRAPHY;  Surgeon: Dionisio David, MD;  Location: [place] [facility];  Service: Cardiovascular;  Laterality: N/A;   LEFT HEART CATH AND CORONARY ANGIOGRAPHY N/A 04/05/2022   Procedure: LEFT HEART CATH AND CORONARY ANGIOGRAPHY;  Surgeon: Jettie Booze, MD;  Location: [place] [facility];  Service: Cardiovascular;  Laterality: N/A;   TUBAL LIGATION       Current Outpatient Medications  Medication Sig Dispense Refill   aspirin (ASPIRIN CHILDRENS) 81 MG chewable tablet Chew 1 tablet (81 mg total) by mouth daily. 30 tablet 0   atorvastatin (LIPITOR) 80 MG tablet Take 1 tablet (80 mg total) by mouth daily. 30 tablet 2   Bacillus Coagulans-Inulin (PROBIOTIC) 1-250 BILLION-MG CAPS Take 1 capsule by mouth  daily. 30 capsule 0   busPIRone (BUSPAR) 10 MG tablet Take 10 mg by mouth 2 (two) times daily as needed.     Cholecalciferol (VITAMIN D) 125 MCG (5000 UT) CAPS Take 5,000 Units by mouth daily.     isosorbide mononitrate (IMDUR) 120 MG 24 hr tablet Take 120 mg by mouth daily.     nitroGLYCERIN (NITROSTAT) 0.4 MG SL tablet Place 1 tablet (0.4 mg total) under the tongue every 5 (five) minutes as needed for chest pain. 30 tablet 12   nystatin cream  (MYCOSTATIN) Apply 1 application topically at bedtime as needed (irritation).     pantoprazole (PROTONIX) 40 MG tablet Take 40 mg by mouth 2 (two) times daily as needed (heartburn).     prasugrel (EFFIENT) 10 MG TABS tablet Take 1 tablet (10 mg total) by mouth daily. 30 tablet 2   ranolazine (RANEXA) 1000 MG SR tablet Take 1,000 mg by mouth 2 (two) times daily.     amLODipine (NORVASC) 5 MG tablet Take 1 tablet (5 mg total) by mouth daily. 30 tablet 2   aspirin EC 81 MG tablet Take 81 mg by mouth daily. Swallow whole. (Patient not taking: Reported on 08/11/2022)     atorvastatin (LIPITOR) 80 MG tablet Take 1 tablet (80 mg total) by mouth daily. 30 tablet 2   busPIRone (BUSPAR) 10 MG tablet Take 10 mg by mouth 2 (two) times daily as needed for anxiety. (Patient not taking: Reported on 07/28/2022)     carvedilol (COREG) 12.5 MG tablet Take 1 tablet (12.5 mg total) by mouth 2 (two) times daily with a meal. 60 tablet 2   carvedilol (COREG) 25 MG tablet Take 25 mg by mouth 2 (two) times daily. (Patient not taking: Reported on 07/28/2022)     Cholecalciferol (VITAMIN D3) 125 MCG (5000 UT) CAPS Take 5,000 Units by mouth daily. (Patient not taking: Reported on 07/28/2022)     diltiazem (CARDIZEM CD) 120 MG 24 hr capsule Take 1 capsule (120 mg total) by mouth daily. (Patient not taking: Reported on 08/11/2022) 30 capsule 11   enalapril (VASOTEC) 20 MG tablet Take 20 mg by mouth daily. (Patient not taking: Reported on 08/11/2022)     ibuprofen (ADVIL) 800 MG tablet Take 1 tablet (800 mg total) by mouth every 8 (eight) hours as needed for mild pain. (Patient not taking: Reported on 08/11/2022) 30 tablet 0   isosorbide mononitrate (IMDUR) 60 MG 24 hr tablet Take 2 tablets (120 mg total) by mouth daily. (Patient not taking: Reported on 08/11/2022) 60 tablet 2   nitroGLYCERIN (NITROSTAT) 0.4 MG SL tablet Place 0.4 mg under the tongue every 5 (five) minutes as needed for chest pain. (Patient not taking: Reported on 08/11/2022)      nystatin cream (MYCOSTATIN) Apply 1 Application topically at bedtime as needed for rash. (Patient not taking: Reported on 08/11/2022)     PRALUENT 75 MG/ML SOAJ Inject 75 mg into the skin every 14 (fourteen) days.     prasugrel (EFFIENT) 10 MG TABS tablet Take 10 mg by mouth daily. (Patient not taking: Reported on 08/11/2022)     ranolazine (RANEXA) 1000 MG SR tablet Take 1,000 mg by mouth 2 (two) times daily. (Patient not taking: Reported on 08/11/2022)     spironolactone (ALDACTONE) 25 MG tablet Take 25 mg by mouth daily. (Patient not taking: Reported on 08/11/2022)     No current facility-administered medications for this visit.   Facility-Administered Medications Ordered in Other Visits  Medication Dose Route  Frequency Provider Last Rate Last Admin   sodium chloride flush (NS) 0.9 % injection 3 mL  3 mL Intravenous Q12H Neoma Laming A, MD       sodium chloride flush (NS) 0.9 % injection 3 mL  3 mL Intravenous Q12H Neoma Laming A, MD       sodium chloride flush (NS) 0.9 % injection 3 mL  3 mL Intravenous Q12H Scoggins, Amber, NP        Allergies:   Penicillins, Atorvastatin, Metoprolol, Penicillins, Repatha [evolocumab], and Zetia [ezetimibe]    Social History:   reports that she quit smoking about 16 months ago. Her smoking use included cigarettes. She has a 10.00 pack-year smoking history. She has never used smokeless tobacco. She reports current alcohol use. She reports that she does not use drugs.   Family History:  family history includes Heart attack in her father and mother.    ROS:     Review of Systems  Constitutional: Negative.   HENT: Negative.    Eyes: Negative.   Respiratory: Negative.    Gastrointestinal: Negative.   Genitourinary: Negative.   Musculoskeletal: Negative.   Skin: Negative.   Neurological: Negative.   Endo/Heme/Allergies: Negative.   Psychiatric/Behavioral: Negative.    All other systems reviewed and are negative.     All other systems are  reviewed and negative.    PHYSICAL EXAM: VS:  BP 130/80   Pulse 73   Ht 5' 11"$  (1.803 m)   Wt 271 lb 6.4 oz (123.1 kg)   SpO2 99%   BMI 37.85 kg/m  , BMI Body mass index is 37.85 kg/m. Last weight:  Wt Readings from Last 3 Encounters:  08/11/22 271 lb 6.4 oz (123.1 kg)  07/11/22 268 lb (121.6 kg)  04/05/22 255 lb (115.7 kg)     Physical Exam Constitutional:      Appearance: Normal appearance.  Cardiovascular:     Rate and Rhythm: Normal rate and regular rhythm.     Heart sounds: Normal heart sounds.  Pulmonary:     Effort: Pulmonary effort is normal.     Breath sounds: Normal breath sounds.  Musculoskeletal:     Right lower leg: No edema.     Left lower leg: Swelling and tenderness present. 2+ Edema present.       Legs:  Neurological:     Mental Status: She is alert.       EKG:   Recent Labs: 10/11/2021: ALT 12 04/05/2022: Hemoglobin 10.8; Platelets 280; TSH 0.493 04/06/2022: BUN 12; Creatinine, Ser 1.30; Potassium 3.8; Sodium 136    Lipid Panel    Component Value Date/Time   CHOL 200 04/05/2022 0938   TRIG 58 04/05/2022 0938   HDL 40 (L) 04/05/2022 0938   CHOLHDL 5.0 04/05/2022 0938   VLDL 12 04/05/2022 0938   LDLCALC 148 (H) 04/05/2022 0938     ASSESSMENT AND PLAN:    ICD-10-CM   1. Acute deep vein thrombosis (DVT) of femoral vein of left lower extremity (HCC)  I82.412 US Venous Img Lower Unilateral Left (DVT)   patient has severe pain in left leg espeacially left thigh. Advise xarelto and do u/s of leg right away.    2. Coronary artery disease involving native coronary artery of native heart without angina pectoris  I25.10     3. Hypertension, unspecified type  I10     4. Mixed hyperlipidemia  E78.2        Problem List Items Addressed This Visit  Cardiovascular and Mediastinum   Coronary artery disease   HTN (hypertension)   Acute deep vein thrombosis (DVT) of femoral vein of left lower extremity (HCC) - Primary   Relevant Orders    US Venous Img Lower Unilateral Left (DVT) (Completed)     Other   HLD (hyperlipidemia)    Total time spent 30 minutes.  Disposition:   Return in about 5 days (around 08/16/2022) for after u/s leg.     Signed,  Neoma Laming, MD  08/15/2022 1:48 PM    [facility]

## 2022-08-15 ENCOUNTER — Other Ambulatory Visit: Payer: Self-pay

## 2022-08-15 NOTE — Addendum Note (Signed)
Addended by: Neoma Laming A on: 08/15/2022 01:52 PM   Modules accepted: Orders

## 2022-08-17 ENCOUNTER — Encounter: Payer: Self-pay | Admitting: Cardiovascular Disease

## 2022-08-17 ENCOUNTER — Ambulatory Visit (INDEPENDENT_AMBULATORY_CARE_PROVIDER_SITE_OTHER): Payer: Medicaid Other | Admitting: Cardiovascular Disease

## 2022-08-17 ENCOUNTER — Other Ambulatory Visit: Payer: Self-pay | Admitting: Nurse Practitioner

## 2022-08-17 ENCOUNTER — Other Ambulatory Visit: Payer: Self-pay

## 2022-08-17 ENCOUNTER — Other Ambulatory Visit: Payer: Medicaid Other

## 2022-08-17 VITALS — BP 128/62 | HR 80 | Ht 71.0 in | Wt 272.0 lb

## 2022-08-17 DIAGNOSIS — I10 Essential (primary) hypertension: Secondary | ICD-10-CM

## 2022-08-17 DIAGNOSIS — M79606 Pain in leg, unspecified: Secondary | ICD-10-CM

## 2022-08-17 DIAGNOSIS — E782 Mixed hyperlipidemia: Secondary | ICD-10-CM | POA: Diagnosis not present

## 2022-08-17 DIAGNOSIS — I2510 Atherosclerotic heart disease of native coronary artery without angina pectoris: Secondary | ICD-10-CM

## 2022-08-17 NOTE — Assessment & Plan Note (Signed)
No chest pain

## 2022-08-17 NOTE — Progress Notes (Signed)
Cardiology Office Note   Date:  08/17/2022   ID:  Analissa, Hirzel 06/11/1981, MRN [id number]  PCP:  Evern Bio, NP  Cardiologist:  Neoma Laming, MD      History of Present Illness: Sonya [name] is a 42 y.o. female who presents for  Chief Complaint  Patient presents with   Follow-up    Follow up    Had steroid dose pack from [facility] and feels better. NO DVT on ultrasound so stopped xarelto  Leg Pain  The incident occurred more than 1 week ago. There was no injury mechanism. The pain is at a severity of 3/10. The pain has been Fluctuating since onset.      Past Medical History:  Diagnosis Date   Coronary artery disease    GERD (gastroesophageal reflux disease)    H/O heart artery stent    Tobacco abuse      Past Surgical History:  Procedure Laterality Date   CORONARY STENT INTERVENTION N/A 06/10/2018   Procedure: CORONARY STENT INTERVENTION;  Surgeon: Yolonda Kida, MD;  Location: [place] [facility];  Service: Cardiovascular;  Laterality: N/A;   CORONARY STENT INTERVENTION N/A 04/14/2021   Procedure: CORONARY STENT INTERVENTION;  Surgeon: Isaias Cowman, MD;  Location: [place] [facility];  Service: Cardiovascular;  Laterality: N/A;   CORONARY STENT INTERVENTION N/A 01/20/2022   Procedure: CORONARY STENT INTERVENTION;  Surgeon: Isaias Cowman, MD;  Location: [place] [facility];  Service: Cardiovascular;  Laterality: N/A;   CORONARY/GRAFT ACUTE MI REVASCULARIZATION N/A 10/11/2021   Procedure: Coronary/Graft Acute MI Revascularization;  Surgeon: Nelva Bush, MD;  Location: [place] [facility];  Service: Cardiovascular;  Laterality: N/A;   LEFT HEART CATH AND CORONARY ANGIOGRAPHY N/A 06/10/2018   Procedure: With coronary intervention and stenting;  Surgeon: Dionisio David, MD;  Location: [place] [facility];  Service: Cardiovascular;  Laterality: N/A;   LEFT HEART CATH AND CORONARY ANGIOGRAPHY Left 06/15/2020   Procedure: LEFT  HEART CATH AND CORONARY ANGIOGRAPHY;  Surgeon: Dionisio David, MD;  Location: [place] [facility];  Service: Cardiovascular;  Laterality: Left;   LEFT HEART CATH AND CORONARY ANGIOGRAPHY N/A 04/14/2021   Procedure: LEFT HEART CATH AND CORONARY ANGIOGRAPHY with intervention;  Surgeon: Dionisio David, MD;  Location: [place] [facility];  Service: Cardiovascular;  Laterality: N/A;   LEFT HEART CATH AND CORONARY ANGIOGRAPHY N/A 10/11/2021   Procedure: LEFT HEART CATH AND CORONARY ANGIOGRAPHY;  Surgeon: Nelva Bush, MD;  Location: [place] [facility];  Service: Cardiovascular;  Laterality: N/A;   LEFT HEART CATH AND CORONARY ANGIOGRAPHY N/A 01/20/2022   Procedure: LEFT HEART CATH AND CORONARY ANGIOGRAPHY;  Surgeon: Dionisio David, MD;  Location: [place] [facility];  Service: Cardiovascular;  Laterality: N/A;   LEFT HEART CATH AND CORONARY ANGIOGRAPHY N/A 04/05/2022   Procedure: LEFT HEART CATH AND CORONARY ANGIOGRAPHY;  Surgeon: Jettie Booze, MD;  Location: [place] [facility];  Service: Cardiovascular;  Laterality: N/A;   TUBAL LIGATION       Current Outpatient Medications  Medication Sig Dispense Refill   amLODipine (NORVASC) 5 MG tablet Take 1 tablet (5 mg total) by mouth daily. 30 tablet 2   aspirin (ASPIRIN CHILDRENS) 81 MG chewable tablet Chew 1 tablet (81 mg total) by mouth daily. 30 tablet 0   atorvastatin (LIPITOR) 80 MG tablet Take 1 tablet (80 mg total) by mouth daily. 30 tablet 2   Bacillus Coagulans-Inulin (PROBIOTIC) 1-250 BILLION-MG CAPS Take 1 capsule  by mouth daily. 30 capsule 0   busPIRone (BUSPAR) 10 MG tablet Take 10 mg by mouth 2 (two) times daily as needed.     Cholecalciferol (VITAMIN D) 125 MCG (5000 UT) CAPS Take 5,000 Units by mouth daily.     isosorbide mononitrate (IMDUR) 120 MG 24 hr tablet Take 120 mg by mouth daily.     methylPREDNISolone (MEDROL DOSEPAK) 4 MG TBPK tablet Take 4 mg by mouth See admin instructions.     nitroGLYCERIN (NITROSTAT) 0.4 MG  SL tablet Place 1 tablet (0.4 mg total) under the tongue every 5 (five) minutes as needed for chest pain. 30 tablet 12   nystatin cream (MYCOSTATIN) Apply 1 application topically at bedtime as needed (irritation).     pantoprazole (PROTONIX) 40 MG tablet Take 40 mg by mouth 2 (two) times daily as needed (heartburn).     PRALUENT 75 MG/ML SOAJ Inject 75 mg into the skin every 14 (fourteen) days.     prasugrel (EFFIENT) 10 MG TABS tablet Take 1 tablet (10 mg total) by mouth daily. 30 tablet 2   ranolazine (RANEXA) 1000 MG SR tablet Take 1,000 mg by mouth 2 (two) times daily.     atorvastatin (LIPITOR) 80 MG tablet Take 1 tablet (80 mg total) by mouth daily. 30 tablet 2   carvedilol (COREG) 12.5 MG tablet Take 1 tablet (12.5 mg total) by mouth 2 (two) times daily with a meal. 60 tablet 2   No current facility-administered medications for this visit.   Facility-Administered Medications Ordered in Other Visits  Medication Dose Route Frequency Provider Last Rate Last Admin   sodium chloride flush (NS) 0.9 % injection 3 mL  3 mL Intravenous Q12H Neoma Laming A, MD       sodium chloride flush (NS) 0.9 % injection 3 mL  3 mL Intravenous Q12H Neoma Laming A, MD       sodium chloride flush (NS) 0.9 % injection 3 mL  3 mL Intravenous Q12H Scoggins, Amber, NP        Allergies:   Penicillins, Atorvastatin, Metoprolol, Penicillins, Repatha [evolocumab], and Zetia [ezetimibe]    Social History:   reports that she quit smoking about 16 months ago. Her smoking use included cigarettes. She has a 10.00 pack-year smoking history. She has never used smokeless tobacco. She reports current alcohol use. She reports that she does not use drugs.   Family History:  family history includes Heart attack in her father and mother.    ROS:     Review of Systems  Constitutional: Negative.   HENT: Negative.    Eyes: Negative.   Respiratory: Negative.    Gastrointestinal: Negative.   Genitourinary: Negative.    Musculoskeletal: Negative.   Skin: Negative.   Neurological: Negative.   Endo/Heme/Allergies: Negative.   Psychiatric/Behavioral: Negative.    All other systems reviewed and are negative.     All other systems are reviewed and negative.    PHYSICAL EXAM: VS:  BP 128/62   Pulse 80   Ht 5' 11"$  (1.803 m)   Wt 272 lb (123.4 kg)   SpO2 97%   BMI 37.94 kg/m  , BMI Body mass index is 37.94 kg/m. Last weight:  Wt Readings from Last 3 Encounters:  08/17/22 272 lb (123.4 kg)  08/11/22 271 lb 6.4 oz (123.1 kg)  07/11/22 268 lb (121.6 kg)     Physical Exam Constitutional:      Appearance: Normal appearance.  Cardiovascular:     Rate and Rhythm:  Normal rate and regular rhythm.     Heart sounds: Normal heart sounds.  Pulmonary:     Effort: Pulmonary effort is normal.     Breath sounds: Normal breath sounds.  Musculoskeletal:     Right lower leg: No edema.     Left lower leg: No edema.  Neurological:     Mental Status: She is alert.       EKG:   Recent Labs: 10/11/2021: ALT 12 04/05/2022: Hemoglobin 10.8; Platelets 280; TSH 0.493 04/06/2022: BUN 12; Creatinine, Ser 1.30; Potassium 3.8; Sodium 136    Lipid Panel    Component Value Date/Time   CHOL 200 04/05/2022 0938   TRIG 58 04/05/2022 0938   HDL 40 (L) 04/05/2022 0938   CHOLHDL 5.0 04/05/2022 0938   VLDL 12 04/05/2022 0938   LDLCALC 148 (H) 04/05/2022 [id number]      Other studies Reviewed: Additional studies/ records that were reviewed today include:  Review of the above records demonstrates:       No data to display            ASSESSMENT AND PLAN:    ICD-10-CM   1. Coronary artery disease involving native coronary artery of native heart without angina pectoris  I25.10     2. Primary hypertension  I10     3. Mixed hyperlipidemia  E78.2     4. Pain of lower extremity, unspecified laterality  M79.606    feeling better with steroids       Problem List Items Addressed This Visit        Cardiovascular and Mediastinum   Coronary artery disease - Primary    No chest pain      HTN (hypertension)     Other   HLD (hyperlipidemia)   Other Visit Diagnoses     Pain of lower extremity, unspecified laterality       feeling better with steroids          Disposition:   No follow-ups on file.    Total time spent: 30 minutes  Signed,  Neoma Laming, MD  08/17/2022 11:24 [place]

## 2022-08-18 LAB — COMPREHENSIVE METABOLIC PANEL
ALT: 14 IU/L (ref 0–32)
AST: 14 IU/L (ref 0–40)
Albumin/Globulin Ratio: 1.6 (ref 1.2–2.2)
Albumin: 4.1 g/dL (ref 3.9–4.9)
Alkaline Phosphatase: 70 IU/L (ref 44–121)
BUN/Creatinine Ratio: 20 (ref 9–23)
BUN: 16 mg/dL (ref 6–24)
Bilirubin Total: 0.2 mg/dL (ref 0.0–1.2)
CO2: 20 mmol/L (ref 20–29)
Calcium: 9 mg/dL (ref 8.7–10.2)
Chloride: 104 mmol/L (ref 96–106)
Creatinine, Ser: 0.82 mg/dL (ref 0.57–1.00)
Globulin, Total: 2.6 g/dL (ref 1.5–4.5)
Glucose: 83 mg/dL (ref 70–99)
Potassium: 3.6 mmol/L (ref 3.5–5.2)
Sodium: 139 mmol/L (ref 134–144)
Total Protein: 6.7 g/dL (ref 6.0–8.5)
eGFR: 92 mL/min/{1.73_m2} (ref >59)

## 2022-08-18 LAB — CBC WITH DIFFERENTIAL/PLATELET
Basophils Absolute: 0 10*3/uL (ref 0.0–0.2)
Basos: 0 %
EOS (ABSOLUTE): 0 10*3/uL (ref 0.0–0.4)
Eos: 0 %
Hematocrit: 35.4 % (ref 34.0–46.6)
Hemoglobin: 11.4 g/dL (ref 11.1–15.9)
Immature Grans (Abs): 0 10*3/uL (ref 0.0–0.1)
Immature Granulocytes: 0 %
Lymphocytes Absolute: 4.4 10*3/uL — ABNORMAL HIGH (ref 0.7–3.1)
Lymphs: 42 %
MCH: 24.6 pg — ABNORMAL LOW (ref 26.6–33.0)
MCHC: 32.2 g/dL (ref 31.5–35.7)
MCV: 76 fL — ABNORMAL LOW (ref 79–97)
Monocytes Absolute: 0.8 10*3/uL (ref 0.1–0.9)
Monocytes: 8 %
Neutrophils Absolute: 5.3 10*3/uL (ref 1.4–7.0)
Neutrophils: 50 %
Platelets: 299 10*3/uL (ref 150–450)
RBC: 4.64 x10E6/uL (ref 3.77–5.28)
RDW: 17.3 % — ABNORMAL HIGH (ref 11.7–15.4)
WBC: 10.5 10*3/uL (ref 3.4–10.8)

## 2022-08-18 LAB — LIPID PANEL W/O CHOL/HDL RATIO
Cholesterol, Total: 164 mg/dL (ref 100–199)
HDL: 60 mg/dL (ref >39)
LDL Chol Calc (NIH): 91 mg/dL (ref 0–99)
Triglycerides: 65 mg/dL (ref 0–149)
VLDL Cholesterol Cal: 13 mg/dL (ref 5–40)

## 2022-08-18 LAB — TSH: TSH: 0.67 u[IU]/mL (ref 0.450–4.500)

## 2022-08-18 LAB — HGB A1C W/O EAG: Hgb A1c MFr Bld: 6.3 % — ABNORMAL HIGH (ref 4.8–5.6)

## 2022-08-23 ENCOUNTER — Telehealth: Payer: Self-pay | Admitting: Nurse Practitioner

## 2022-08-23 ENCOUNTER — Ambulatory Visit (INDEPENDENT_AMBULATORY_CARE_PROVIDER_SITE_OTHER): Payer: Medicaid Other | Admitting: Nurse Practitioner

## 2022-08-23 VITALS — BP 152/90 | HR 84 | Ht 71.0 in | Wt 273.0 lb

## 2022-08-23 DIAGNOSIS — M79605 Pain in left leg: Secondary | ICD-10-CM | POA: Diagnosis not present

## 2022-08-23 DIAGNOSIS — I10 Essential (primary) hypertension: Secondary | ICD-10-CM

## 2022-08-23 DIAGNOSIS — E782 Mixed hyperlipidemia: Secondary | ICD-10-CM | POA: Diagnosis not present

## 2022-08-23 DIAGNOSIS — R8290 Unspecified abnormal findings in urine: Secondary | ICD-10-CM | POA: Diagnosis not present

## 2022-08-23 DIAGNOSIS — R7303 Prediabetes: Secondary | ICD-10-CM

## 2022-08-23 LAB — POCT URINALYSIS DIPSTICK
Bilirubin, UA: NEGATIVE
Blood, UA: NEGATIVE
Glucose, UA: NEGATIVE
Ketones, UA: NEGATIVE
Leukocytes, UA: NEGATIVE
Nitrite, UA: NEGATIVE
Protein, UA: NEGATIVE
Spec Grav, UA: >=1.03 — AB (ref 1.010–1.025)
Urobilinogen, UA: 0.2 E.U./dL
pH, UA: 6 (ref 5.0–8.0)

## 2022-08-23 NOTE — Patient Instructions (Addendum)
1) Review recent fasting labs, chol elevated, A1c at 6.3% 2) With elevated LDL chol, giving patient samples of Repatha, pt's insurance did not cover praluent 3) Magnesium 250 mg nightly for leg cramps x 2 weeks, send message if helpful 4) Follow up appt in 3 months, 2 weeks, fasting labs prior

## 2022-08-23 NOTE — Addendum Note (Signed)
Addended by: Wyvonna Plum on: 08/23/2022 09:24 AM   Modules accepted: Orders

## 2022-08-23 NOTE — Telephone Encounter (Signed)
Called pt with results of UA.

## 2022-08-23 NOTE — Progress Notes (Addendum)
Established Patient Office Visit  Subjective:  Patient ID: Sonya [name], female    DOB: 07-12-1980  Age: 42 y.o. MRN: [id number]  Chief Complaint  Patient presents with   Follow-up    Follow Up     Follow up and lab results.  Discuss portion control on carbs.  Left calf pain, and left flank pain.  UA today.  Addendum - UA WNL, no UTI today.  Pt notified.     Past Medical History:  Diagnosis Date   Coronary artery disease    GERD (gastroesophageal reflux disease)    H/O heart artery stent    Tobacco abuse     Social History   Socioeconomic History   Marital status: Single    Spouse name: Not on file   Number of children: 4   Years of education: Not on file   Highest education level: Not on file  Occupational History   Not on file  Tobacco Use   Smoking status: Former    Packs/day: 0.50    Years: 20.00    Total pack years: 10.00    Types: Cigarettes    Quit date: 03/2021    Years since quitting: 1.4   Smokeless tobacco: Never  Vaping Use   Vaping Use: Former  Substance and Sexual Activity   Alcohol use: Yes    Comment: occasionally   Drug use: Never   Sexual activity: Yes  Other Topics Concern   Not on file  Social History Narrative   ** Merged History Encounter **       Lives at home with all her children   Social Determinants of Health   Financial Resource Strain: Not on file  Food Insecurity: Not on file  Transportation Needs: Not on file  Physical Activity: Not on file  Stress: Not on file  Social Connections: Not on file  Intimate Partner Violence: Not on file    Family History  Problem Relation Age of Onset   Heart attack Mother    Heart attack Father     Allergies  Allergen Reactions   Penicillins Hives   Atorvastatin Other (See Comments)    Muscle spasms   Metoprolol Hives   Penicillins Other (See Comments)   Repatha [Evolocumab]    Zetia [Ezetimibe] Rash    Review of Systems  Constitutional: Negative.   HENT:  Negative.    Eyes: Negative.   Respiratory: Negative.    Cardiovascular: Negative.   Gastrointestinal: Negative.   Genitourinary:  Positive for flank pain.  Musculoskeletal:  Positive for joint pain and myalgias.  Skin: Negative.   Neurological: Negative.   Endo/Heme/Allergies: Negative.   Psychiatric/Behavioral: Negative.    All other systems reviewed and are negative.      Objective:   BP (!) 152/90   Pulse 84   Ht '5\' 11"'$  (1.803 m)   Wt 273 lb (123.8 kg)   SpO2 97%   BMI 38.08 kg/m   Vitals:   08/23/22 0851  BP: (!) 152/90  Pulse: 84  Height: '5\' 11"'$  (1.803 m)  Weight: 273 lb (123.8 kg)  SpO2: 97%  BMI (Calculated): 38.09    Physical Exam Vitals reviewed.  Constitutional:      Appearance: Normal appearance.  HENT:     Head: Normocephalic.     Nose: Nose normal.     Mouth/Throat:     Mouth: Mucous membranes are moist.  Eyes:     Pupils: Pupils are equal, round, and reactive to light.  Cardiovascular:     Rate and Rhythm: Normal rate and regular rhythm.  Pulmonary:     Effort: Pulmonary effort is normal.     Breath sounds: Normal breath sounds.  Abdominal:     General: Bowel sounds are normal.     Palpations: Abdomen is soft.  Musculoskeletal:        General: Tenderness present. Normal range of motion.     Cervical back: Normal range of motion and neck supple.     Left lower leg: Edema present.  Skin:    General: Skin is warm and dry.  Neurological:     Mental Status: She is alert and oriented to person, place, and time.  Psychiatric:        Mood and Affect: Mood normal.        Behavior: Behavior normal.      Results for orders placed or performed in visit on 08/23/22  POCT Urinalysis Dipstick (81002)  Result Value Ref Range   Color, UA Yellow    Clarity, UA Cloudy    Glucose, UA Negative Negative   Bilirubin, UA Negative    Ketones, UA Negative    Spec Grav, UA >=1.030 (A) 1.010 - 1.025   Blood, UA Negative    pH, UA 6.0 5.0 - 8.0    Protein, UA Negative Negative   Urobilinogen, UA 0.2 0.2 or 1.0 E.U./dL   Nitrite, UA Negative    Leukocytes, UA Negative Negative   Appearance Cloudy    Odor Yes     Recent Results (from the past 2160 hour(s))  CBC with Differential/Platelet     Status: Abnormal   Collection Time: 08/17/22 11:48 AM  Result Value Ref Range   WBC 10.5 3.4 - 10.8 x10E3/uL   RBC 4.64 3.77 - 5.28 x10E6/uL   Hemoglobin 11.4 11.1 - 15.9 g/dL   Hematocrit 35.4 34.0 - 46.6 %   MCV 76 (L) 79 - 97 fL   MCH 24.6 (L) 26.6 - 33.0 pg   MCHC 32.2 31.5 - 35.7 g/dL   RDW 17.3 (H) 11.7 - 15.4 %   Platelets 299 150 - 450 x10E3/uL   Neutrophils 50 Not Estab. %   Lymphs 42 Not Estab. %   Monocytes 8 Not Estab. %   Eos 0 Not Estab. %   Basos 0 Not Estab. %   Neutrophils Absolute 5.3 1.4 - 7.0 x10E3/uL   Lymphocytes Absolute 4.4 (H) 0.7 - 3.1 x10E3/uL   Monocytes Absolute 0.8 0.1 - 0.9 x10E3/uL   EOS (ABSOLUTE) 0.0 0.0 - 0.4 x10E3/uL   Basophils Absolute 0.0 0.0 - 0.2 x10E3/uL   Immature Granulocytes 0 Not Estab. %   Immature Grans (Abs) 0.0 0.0 - 0.1 x10E3/uL  Comprehensive metabolic panel     Status: None   Collection Time: 08/17/22 11:48 AM  Result Value Ref Range   Glucose 83 70 - 99 mg/dL   BUN 16 6 - 24 mg/dL   Creatinine, Ser 0.82 0.57 - 1.00 mg/dL   eGFR 92 >59 mL/min/1.73   BUN/Creatinine Ratio 20 9 - 23   Sodium 139 134 - 144 mmol/L   Potassium 3.6 3.5 - 5.2 mmol/L   Chloride 104 96 - 106 mmol/L   CO2 20 20 - 29 mmol/L   Calcium 9.0 8.7 - 10.2 mg/dL   Total Protein 6.7 6.0 - 8.5 g/dL   Albumin 4.1 3.9 - 4.9 g/dL   Globulin, Total 2.6 1.5 - 4.5 g/dL   Albumin/Globulin Ratio 1.6 1.2 -  2.2   Bilirubin Total 0.2 0.0 - 1.2 mg/dL   Alkaline Phosphatase 70 44 - 121 IU/L   AST 14 0 - 40 IU/L   ALT 14 0 - 32 IU/L  Lipid Panel w/o Chol/HDL Ratio     Status: None   Collection Time: 08/17/22 11:48 AM  Result Value Ref Range   Cholesterol, Total 164 100 - 199 mg/dL   Triglycerides 65 0 - 149 mg/dL    HDL 60 >39 mg/dL   VLDL Cholesterol Cal 13 5 - 40 mg/dL   LDL Chol Calc (NIH) 91 0 - 99 mg/dL  Hgb A1c w/o eAG     Status: Abnormal   Collection Time: 08/17/22 11:48 AM  Result Value Ref Range   Hgb A1c MFr Bld 6.3 (H) 4.8 - 5.6 %    Comment:          Prediabetes: 5.7 - 6.4          Diabetes: >6.4          Glycemic control for adults with diabetes: <7.0   TSH     Status: None   Collection Time: 08/17/22 11:48 AM  Result Value Ref Range   TSH 0.670 0.450 - 4.500 uIU/mL  POCT Urinalysis Dipstick [id number])     Status: Abnormal   Collection Time: 08/23/22  9:24 AM  Result Value Ref Range   Color, UA Yellow    Clarity, UA Cloudy    Glucose, UA Negative Negative   Bilirubin, UA Negative    Ketones, UA Negative    Spec Grav, UA >=1.030 (A) 1.010 - 1.025   Blood, UA Negative    pH, UA 6.0 5.0 - 8.0   Protein, UA Negative Negative   Urobilinogen, UA 0.2 0.2 or 1.0 E.U./dL   Nitrite, UA Negative    Leukocytes, UA Negative Negative   Appearance Cloudy    Odor Yes       Assessment & Plan:   Problem List Items Addressed This Visit       Cardiovascular and Mediastinum   HTN (hypertension) - Primary     Other   HLD (hyperlipidemia)   Prediabetes   Left leg pain   Other Visit Diagnoses     Abnormal urine odor       Relevant Orders   POCT Urinalysis Dipstick [id number]) (Completed)       Return in about 3 months (around 11/21/2022).   Total time spent: 30 minutes  Evern Bio, NP  08/23/2022

## 2022-08-27 ENCOUNTER — Encounter: Payer: Self-pay | Admitting: Nurse Practitioner

## 2022-08-28 ENCOUNTER — Encounter: Payer: Self-pay | Admitting: Emergency Medicine

## 2022-08-28 ENCOUNTER — Other Ambulatory Visit: Payer: Self-pay

## 2022-08-28 ENCOUNTER — Emergency Department
Admission: EM | Admit: 2022-08-28 | Discharge: 2022-08-28 | Disposition: A | Discharge status: Home / Self Care | Payer: BC Managed Care – PPO | Attending: Emergency Medicine | Admitting: Emergency Medicine

## 2022-08-28 ENCOUNTER — Encounter: Payer: Self-pay | Admitting: Nurse Practitioner

## 2022-08-28 DIAGNOSIS — M545 Low back pain, unspecified: Secondary | ICD-10-CM | POA: Diagnosis present

## 2022-08-28 DIAGNOSIS — I10 Essential (primary) hypertension: Secondary | ICD-10-CM | POA: Diagnosis not present

## 2022-08-28 DIAGNOSIS — Z87891 Personal history of nicotine dependence: Secondary | ICD-10-CM | POA: Insufficient documentation

## 2022-08-28 DIAGNOSIS — I2510 Atherosclerotic heart disease of native coronary artery without angina pectoris: Secondary | ICD-10-CM | POA: Insufficient documentation

## 2022-08-28 DIAGNOSIS — M5416 Radiculopathy, lumbar region: Secondary | ICD-10-CM | POA: Insufficient documentation

## 2022-08-28 DIAGNOSIS — M79605 Pain in left leg: Secondary | ICD-10-CM | POA: Insufficient documentation

## 2022-08-28 MED ORDER — LIDOCAINE 5 % EX PTCH: 1.0000 | MEDICATED_PATCH | Freq: Two times a day (BID) | CUTANEOUS | 0 refills | Status: AC

## 2022-08-28 MED ORDER — NAPROXEN 500 MG PO TABS: 500.0000 mg | ORAL_TABLET | Freq: Two times a day (BID) | ORAL | 0 refills | Status: AC

## 2022-08-28 MED ORDER — DEXAMETHASONE SODIUM PHOSPHATE 10 MG/ML IJ SOLN
10.0000 mg | Freq: Once | INTRAMUSCULAR | Status: AC
  Administered 2022-08-28: 10 mg via INTRAMUSCULAR
  Filled 2022-08-28: qty 1

## 2022-08-28 MED ORDER — PREDNISONE 10 MG (21) PO TBPK: ORAL_TABLET | ORAL | 0 refills | Status: DC

## 2022-08-28 MED ORDER — ACETAMINOPHEN 325 MG PO TABS
650.0000 mg | ORAL_TABLET | Freq: Once | ORAL | Status: AC
  Administered 2022-08-28: 650 mg via ORAL
  Filled 2022-08-28: qty 2

## 2022-08-28 MED ORDER — KETOROLAC TROMETHAMINE 15 MG/ML IJ SOLN
15.0000 mg | Freq: Once | INTRAMUSCULAR | Status: AC
  Administered 2022-08-28: 15 mg via INTRAMUSCULAR
  Filled 2022-08-28: qty 1

## 2022-08-28 MED ORDER — LIDOCAINE 5 % EX PTCH
1.0000 | MEDICATED_PATCH | CUTANEOUS | Status: DC
  Administered 2022-08-28: 1 via TRANSDERMAL
  Filled 2022-08-28: qty 1

## 2022-08-28 NOTE — ED Triage Notes (Signed)
Patient to ED via POV for left leg pain that started at hip and radiates down into lower leg. Hx of sciatica. Pain ongoing x2 weeks.

## 2022-08-28 NOTE — ED Provider Notes (Signed)
[facility] Provider Note    Event Date/Time   First MD Initiated Contact with Patient 08/28/22 [phone]     (approximate)   History   Leg Pain   HPI  Sonya [name] is a 42 y.o. female with a past medical history of coronary artery disease, hyperlipidemia, sciatica who presents today for evaluation of left leg pain that has been ongoing for the past 2 weeks.  Patient reports that her pain feels exactly the same as her previous sciatica pain.  She reports that the pain starts in her left low back and radiates down her left leg.  She reports that she has pain with walking but no weakness.  No urinary or fecal incontinence or retention.  No saddle anesthesia.  No fevers or chills.  No abdominal pain or chest pain.  Patient Active Problem List   Diagnosis Date Noted   Prediabetes 08/23/2022   Left leg pain 08/23/2022   Chest pain 01/19/2022   Chest pain, rule out acute myocardial infarction 12/07/2021   STEMI involving right coronary artery ([place]) 10/11/2021   Unstable angina pectoris ([place]) 04/14/2021   HTN (hypertension) 04/14/2021   Coronary artery disease    HLD (hyperlipidemia)    Unstable angina ([place]) 06/08/2020   STEMI (ST elevation myocardial infarction) ([place]) 06/07/2018   GERD (gastroesophageal reflux disease) 06/07/2018   Tobacco abuse 06/07/2018          Physical Exam   Triage Vital Signs: ED Triage Vitals  Enc Vitals Group     BP 08/28/22 0729 139/89     Pulse Rate 08/28/22 0729 81     Resp 08/28/22 0729 18     Temp 08/28/22 0729 98 F (36.7 C)     Temp Source 08/28/22 0729 Oral     SpO2 08/28/22 0729 100 %     Weight --      Height --      Head Circumference --      Peak Flow --      Pain Score 08/28/22 0728 10     Pain Loc --      Pain Edu? --      Excl. in [place]? --     Most recent vital signs: Vitals:   08/28/22 0729  BP: 139/89  Pulse: 81  Resp: 18  Temp: 98 F (36.7 C)  SpO2: 100%    Physical Exam Vitals and  nursing note reviewed.  Constitutional:      General: Awake and alert. No acute distress.    Appearance: Normal appearance. The patient is obese.  HENT:     Head: Normocephalic and atraumatic.     Mouth: Mucous membranes are moist.  Eyes:     General: PERRL. Normal EOMs        Right eye: No discharge.        Left eye: No discharge.     Conjunctiva/sclera: Conjunctivae normal.  Cardiovascular:     Rate and Rhythm: Normal rate and regular rhythm.     Pulses: Normal pulses.  Pulmonary:     Effort: Pulmonary effort is normal. No respiratory distress.     Breath sounds: Normal breath sounds.  Abdominal:     Abdomen is soft. There is no abdominal tenderness. No rebound or guarding. No distention.\ Back: No midline tenderness.  Left-sided lumbar paraspinal muscle tenderness.  Strength and sensation 5/5 to bilateral lower extremities. Normal great toe extension against resistance. Normal sensation throughout feet. Normal patellar reflexes. Positive SLR  on the left and opposite SLR. Negative FABER test Musculoskeletal:        General: No swelling. Normal range of motion.     Cervical back: Normal range of motion and neck supple.  Skin:    General: Skin is warm and dry.     Capillary Refill: Capillary refill takes less than 2 seconds.     Findings: No rash.  Neurological:     Mental Status: The patient is awake and alert.      ED Results / Procedures / Treatments   Labs (all labs ordered are listed, but only abnormal results are displayed) Labs Reviewed - No data to display   EKG     RADIOLOGY     PROCEDURES:  Critical Care performed:   Procedures   MEDICATIONS ORDERED IN ED: Medications  lidocaine (LIDODERM) 5 % 1 patch (1 patch Transdermal Patch Applied 08/28/22 0746)  ketorolac (TORADOL) 15 MG/ML injection 15 mg (15 mg Intramuscular Given 08/28/22 0747)  acetaminophen (TYLENOL) tablet 650 mg (650 mg Oral Given 08/28/22 0745)  dexamethasone (DECADRON) injection 10 mg  (10 mg Intramuscular Given 08/28/22 0749)     IMPRESSION / MDM / ASSESSMENT AND PLAN / ED COURSE  I reviewed the triage vital signs and the nursing notes.   Differential diagnosis includes, but is not limited to, spasm, strain, lumbar radiculopathy, recurrence of sciatica.  Patient is awake and alert, hemodynamically stable and afebrile.  She has a history of back pain and presents with back pain that feels the same as her previous sciatica exacerbations.  She has 5 out of 5 strength with intact sensation to extensor hallucis dorsiflexion and plantarflexion of bilateral lower extremities with normal patellar reflexes bilaterally. Most likely etiology at this point is muscle strain vs herniated disc. No red flags to indicate patient is at risk for more auspicious process that would require urgent/emergent spinal imaging or subspecialty evaluation at this time. No major trauma, no midline tenderness, no history or physical exam findings to suggest cauda equina syndrome or spinal cord compression. No focal neurological deficits on exam. No constitutional symptoms or history of immunosuppression or IVDA to suggest potential for epidural abscess. Not anticoagulated, no history of bleeding diastasis to suggest risk for epidural hematoma. No chronic steroid use or advanced age or history of malignancy to suggest proclivity towards pathological fracture.  No abdominal pain or flank pain to suggest kidney stone, no history of kidney stone.  No fever or dysuria or CVAT to suggest pyelonephritis .  No chest pain, back pain, shortness of breath, neurological deficits, to suggest vascular catastrophe, and pulses are equal in all 4 extremities.  Discussed care instructions and return precautions with patient. Recommended close outpatient follow-up for re-evaluation. Patient agrees with plan of care. Will treat the patient symptomatically as needed for pain control. Will discharge patient to take these medications and  return for any worsening or different pain or development of any neurologic symptoms. Educated patient regarding expected time course for back pain to improve and recommended very close outpatient follow-up.  Patient understands and agrees with plan.  She was discharged in stable condition.   Patient's presentation is most consistent with acute complicated illness / injury requiring diagnostic workup.    FINAL CLINICAL IMPRESSION(S) / ED DIAGNOSES   Final diagnoses:  Lumbar radiculopathy     Rx / DC Orders   ED Discharge Orders          Ordered    predniSONE (STERAPRED UNI-PAK 21 TAB)  10 MG (21) TBPK tablet        08/28/22 0740    naproxen (NAPROSYN) 500 MG tablet  2 times daily with meals        08/28/22 0740    lidocaine (LIDODERM) 5 %  Every 12 hours        08/28/22 0740             Note:  This document was prepared using Dragon voice recognition software and may include unintentional dictation errors.   Emeline Gins 08/28/22 [id number]    Lavonia Drafts, MD 08/28/22 [phone]

## 2022-08-28 NOTE — Discharge Instructions (Signed)
Take medications as prescribed.  Do not do any heavy lifting or bending from the waist until your symptoms resolve.  Please follow-up with the back doctor.  Please return to the emergency department for any new, worsening, or changing symptoms or other concerns including weakness in your legs, urinary or stool incontinence or retention, numbness or tingling in your extremities/buttocks/groin, fevers, or any other concerns or change in symptoms.

## 2022-09-06 ENCOUNTER — Ambulatory Visit: Payer: BC Managed Care – PPO | Admitting: Orthopedic Surgery

## 2022-09-07 NOTE — Progress Notes (Signed)
Referring Physician:  Evern Bio, [place] [place],  [place] [address]  Primary Physician:  Evern Bio, NP  History of Present Illness: 09/07/2022 [name] has a history of CAD, hyperlipidemia, MI, HTN, and GERD. History of chronic back and leg pain.   Seen in ED on 08/28/22 with 2 week history of LBP and left leg pain  She has constant low back pain with posterior left leg pain to her heel x 1-2 months. No right leg pain. She has numbness and tingling in left leg. May have some weakness. No alleviating/aggravating factors. She did have some relief with medications from ED (she was hardly able to walk at that point). No known injury.   History of back and leg pain years ago that was self resolving.   She is on EFFIENT. Was given lidoderm patches, naproxen, and prednisone taper by ED.   Bowel/Bladder Dysfunction: none  Conservative measures:  Physical therapy: none Multimodal medical therapy including regular antiinflammatories: lidoderm patches, naproxen, prednisone  Injections: No ESIs  Past Surgery: No spinal surgery  Asheton L Worthing has no symptoms of cervical myelopathy.  The symptoms are causing a significant impact on the patient's life.   Review of Systems:  A 10 point review of systems is negative, except for the pertinent positives and negatives detailed in the HPI.  Past Medical History: Past Medical History:  Diagnosis Date   Coronary artery disease    GERD (gastroesophageal reflux disease)    H/O heart artery stent    Tobacco abuse     Past Surgical History: Past Surgical History:  Procedure Laterality Date   CORONARY STENT INTERVENTION N/A 06/10/2018   Procedure: CORONARY STENT INTERVENTION;  Surgeon: Yolonda Kida, MD;  Location: [facility];  Service: Cardiovascular;  Laterality: N/A;   CORONARY STENT INTERVENTION N/A 04/14/2021   Procedure: CORONARY STENT INTERVENTION;  Surgeon: Isaias Cowman, MD;   Location: [place];  Service: Cardiovascular;  Laterality: N/A;   CORONARY STENT INTERVENTION N/A 01/20/2022   Procedure: CORONARY STENT INTERVENTION;  Surgeon: Isaias Cowman, MD;  Location: [place] [facility];  Service: Cardiovascular;  Laterality: N/A;   CORONARY/GRAFT ACUTE MI REVASCULARIZATION N/A 10/11/2021   Procedure: Coronary/Graft Acute MI Revascularization;  Surgeon: Nelva Bush, MD;  Location: [facility];  Service: Cardiovascular;  Laterality: N/A;   LEFT HEART CATH AND CORONARY ANGIOGRAPHY N/A 06/10/2018   Procedure: With coronary intervention and stenting;  Surgeon: Dionisio David, MD;  Location: [place];  Service: Cardiovascular;  Laterality: N/A;   LEFT HEART CATH AND CORONARY ANGIOGRAPHY Left 06/15/2020   Procedure: LEFT HEART CATH AND CORONARY ANGIOGRAPHY;  Surgeon: Dionisio David, MD;  Location: [facility];  Service: Cardiovascular;  Laterality: Left;   LEFT HEART CATH AND CORONARY ANGIOGRAPHY N/A 04/14/2021   Procedure: LEFT HEART CATH AND CORONARY ANGIOGRAPHY with intervention;  Surgeon: Dionisio David, MD;  Location: [facility] [facility];  Service: Cardiovascular;  Laterality: N/A;   LEFT HEART CATH AND CORONARY ANGIOGRAPHY N/A 10/11/2021   Procedure: LEFT HEART CATH AND CORONARY ANGIOGRAPHY;  Surgeon: Nelva Bush, MD;  Location: [place] [facility];  Service: Cardiovascular;  Laterality: N/A;   LEFT HEART CATH AND CORONARY ANGIOGRAPHY N/A 01/20/2022   Procedure: LEFT HEART CATH AND CORONARY ANGIOGRAPHY;  Surgeon: Dionisio David, MD;  Location: [facility];  Service: Cardiovascular;  Laterality: N/A;   LEFT HEART CATH AND CORONARY ANGIOGRAPHY N/A 04/05/2022  Procedure: LEFT HEART CATH AND CORONARY ANGIOGRAPHY;  Surgeon: Jettie Booze, MD;  Location: [facility];  Service: Cardiovascular;  Laterality: N/A;   TUBAL LIGATION      Allergies: Allergies as of 09/11/2022 - Review Complete  08/28/2022  Allergen Reaction Noted   Penicillins Hives 02/06/2017   Atorvastatin Other (See Comments) 04/05/2022   Metoprolol Hives 10/11/2021   Penicillins Other (See Comments) 04/05/2022   Repatha [evolocumab]  07/28/2022   Zetia [ezetimibe] Rash 07/28/2022    Medications: Outpatient Encounter Medications as of 09/11/2022  Medication Sig   amLODipine (NORVASC) 5 MG tablet Take 1 tablet (5 mg total) by mouth daily.   aspirin (ASPIRIN CHILDRENS) 81 MG chewable tablet Chew 1 tablet (81 mg total) by mouth daily.   atorvastatin (LIPITOR) 80 MG tablet Take 1 tablet (80 mg total) by mouth daily.   atorvastatin (LIPITOR) 80 MG tablet Take 1 tablet (80 mg total) by mouth daily.   Bacillus Coagulans-Inulin (PROBIOTIC) 1-250 BILLION-MG CAPS Take 1 capsule by mouth daily.   busPIRone (BUSPAR) 10 MG tablet Take 10 mg by mouth 2 (two) times daily as needed.   carvedilol (COREG) 12.5 MG tablet Take 1 tablet (12.5 mg total) by mouth 2 (two) times daily with a meal.   Cholecalciferol (VITAMIN D) 125 MCG (5000 UT) CAPS Take 5,000 Units by mouth daily.   isosorbide mononitrate (IMDUR) 120 MG 24 hr tablet Take 120 mg by mouth daily.   nitroGLYCERIN (NITROSTAT) 0.4 MG SL tablet Place 1 tablet (0.4 mg total) under the tongue every 5 (five) minutes as needed for chest pain.   nystatin cream (MYCOSTATIN) Apply 1 application topically at bedtime as needed (irritation).   pantoprazole (PROTONIX) 40 MG tablet Take 40 mg by mouth 2 (two) times daily as needed (heartburn).   PRALUENT 75 MG/ML SOAJ Inject 75 mg into the skin every 14 (fourteen) days.   prasugrel (EFFIENT) 10 MG TABS tablet Take 1 tablet (10 mg total) by mouth daily.   predniSONE (STERAPRED UNI-PAK 21 TAB) 10 MG (21) TBPK tablet Take 6 tablets at once by mouth on day 1 and decrease by 1 tablet for each subsequent day   ranolazine (RANEXA) 1000 MG SR tablet Take 1,000 mg by mouth 2 (two) times daily.   Facility-Administered Encounter Medications as  of 09/11/2022  Medication   sodium chloride flush (NS) 0.9 % injection 3 mL   sodium chloride flush (NS) 0.9 % injection 3 mL   sodium chloride flush (NS) 0.9 % injection 3 mL    Social History: Social History   Tobacco Use   Smoking status: Former    Packs/day: 0.50    Years: 20.00    Additional pack years: 0.00    Total pack years: 10.00    Types: Cigarettes    Quit date: 03/2021    Years since quitting: 1.4   Smokeless tobacco: Never  Vaping Use   Vaping Use: Former  Substance Use Topics   Alcohol use: Yes    Comment: occasionally   Drug use: Never    Family Medical History: Family History  Problem Relation Age of Onset   Heart attack Mother    Heart attack Father     Physical Examination: There were no vitals filed for this visit.  General: Patient is well developed, well nourished, calm, collected, and in no apparent distress. Attention to examination is appropriate.  Respiratory: Patient is breathing without any difficulty.   NEUROLOGICAL:     Awake, alert, oriented  to person, place, and time.  Speech is clear and fluent. Fund of knowledge is appropriate.   Cranial Nerves: Pupils equal round and reactive to light.  Facial tone is symmetric.    Limited ROM of lumbar spine with pain on extension Diffuse posterior lumbar tenderness.   No abnormal lesions on exposed skin.   Strength: Side Biceps Triceps Deltoid Interossei Grip Wrist Ext. Wrist Flex.  R 5 5 5 5 5 5 5   L 5 5 5 5 5 5 5    Side Iliopsoas Quads Hamstring PF DF EHL  R 5 5 5 5 5 5   L 5 5 5 5 5 5    Reflexes are 2+ and symmetric at the biceps, triceps, brachioradialis, patella and achilles.   Hoffman's is absent.  Clonus is not present.   Bilateral upper and lower extremity sensation is intact to light touch.     She has limping gait favoring left leg.   She has joint line tenderness of left knee with limited ROM.    Medical Decision Making  Imaging: none  Assessment and Plan: Ms.  Tarver is a pleasant 42 y.o. female has constant low back pain with posterior left leg pain to her heel x 1-2 months. No right leg pain. She has numbness and tingling in left leg. May have some weakness. She also has left knee pain.   No lumbar imaging done. LBP and left leg pain appear to be lumbar mediated. Left knee pain appears knee mediated.   Treatment options discussed with patient and following plan made:   - Prescription for robaxin to take prn muscle spasms. Reviewed dosing and side effects. Discussed this can cause drowsiness.  - MRI of lumbar spine to further evaluate left lumbar radiculopathy. No improvement with time or medications (prednisone).  - Referral to ortho at [facility] for evaluation of knee pain.  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 Will let me know if she needs to come out.  - Will schedule phone visit to review MRI results once I get them back.   I spent a total of 35 minutes in face-to-face and non-face-to-face activities related to this patient's care today including review of outside records, review of imaging, review of symptoms, physical exam, discussion of differential diagnosis, discussion of treatment options, and documentation.   Thank you for involving me in the care of this patient.   Geronimo Boot PA-C Dept. of Neurosurgery

## 2022-09-08 ENCOUNTER — Ambulatory Visit: Payer: BC Managed Care – PPO | Admitting: Orthopedic Surgery

## 2022-09-11 ENCOUNTER — Encounter: Payer: Self-pay | Admitting: Orthopedic Surgery

## 2022-09-11 ENCOUNTER — Ambulatory Visit (INDEPENDENT_AMBULATORY_CARE_PROVIDER_SITE_OTHER): Payer: BC Managed Care – PPO | Admitting: Orthopedic Surgery

## 2022-09-11 VITALS — BP 124/82 | HR 91 | Ht 71.0 in | Wt 272.0 lb

## 2022-09-11 DIAGNOSIS — M5416 Radiculopathy, lumbar region: Secondary | ICD-10-CM | POA: Diagnosis not present

## 2022-09-11 DIAGNOSIS — M25562 Pain in left knee: Secondary | ICD-10-CM | POA: Diagnosis not present

## 2022-09-11 DIAGNOSIS — M5442 Lumbago with sciatica, left side: Secondary | ICD-10-CM

## 2022-09-11 MED ORDER — METHOCARBAMOL 500 MG PO TABS: 500.0000 mg | ORAL_TABLET | Freq: Three times a day (TID) | ORAL | 0 refills | Status: DC | PRN

## 2022-09-11 NOTE — Patient Instructions (Signed)
It was so nice to see you today. Thank you so much for coming in.    I think your back and left leg pain are coming from your back. Your left knee pain is likely coming from your knee.   I want to get an MRI of your lower barck to look into things further. We will get this approved through your insurance and [facility] will call you to schedule the appointment.   I also sent a prescription for methocarbamol to help with muscle spasms. Use only as needed and be careful, this can make you sleepy.   I want you to see ortho at the [facility] for your left knee pain. They should call you to schedule an appointment or you can call them at [phone].  Let me know if you need any notes for work.   Once your MRI is done, we will call you to schedule a phone visit to review the results.   Please do not hesitate to call if you have any questions or concerns. You can also message me in [place].   Geronimo Boot PA-C [phone]

## 2022-09-15 ENCOUNTER — Ambulatory Visit: Payer: Medicaid Other | Admitting: Cardiovascular Disease

## 2022-09-23 ENCOUNTER — Ambulatory Visit
Admission: RE | Admit: 2022-09-23 | Discharge: 2022-09-23 | Disposition: A | Discharge status: Home / Self Care | Payer: BC Managed Care – PPO | Source: Ambulatory Visit | Attending: Orthopedic Surgery | Admitting: Orthopedic Surgery

## 2022-09-23 DIAGNOSIS — M5442 Lumbago with sciatica, left side: Secondary | ICD-10-CM

## 2022-09-23 DIAGNOSIS — M5416 Radiculopathy, lumbar region: Secondary | ICD-10-CM

## 2022-10-02 ENCOUNTER — Telehealth: Payer: Self-pay

## 2022-10-02 NOTE — Telephone Encounter (Signed)
Steward Drone apple with [employer] called asking for a letter stating that the patient is okay to return to work with no work restrictions please advise

## 2022-10-03 NOTE — Telephone Encounter (Signed)
Letters are ready for pick up

## 2022-10-10 ENCOUNTER — Ambulatory Visit: Payer: Medicaid Other | Admitting: Cardiovascular Disease

## 2022-10-10 NOTE — Progress Notes (Unsigned)
   Telephone Visit- Progress Note: Referring Physician:  No referring provider defined for this encounter.  Primary Physician:  Orson Eva, NP  This visit was performed via telephone.  Patient location: home Provider location: office  I spent a total of 10 minutes non-face-to-face activities for this visit on the date of this encounter including review of current clinical condition and response to treatment.    Patient has given verbal consent to this telephone visits and we reviewed the limitations of a telephone visit. Patient wishes to proceed.    Chief Complaint:  review lumbar MRI  History of Present Illness: Sonya [name] is a 42 y.o. female has a history of CAD, hyperlipidemia, MI, HTN, and GERD. History of chronic back and leg pain.    Last seen by me on 09/11/22 for back and left leg pain. She was given robaxin and referred to [facility] for her knee pain.   She had lumbar MRI done and phone visit scheduled to review it.   She notes some improvement in her LBP, it is not intermittent. She still has left posterior leg pain with some numbness. No specific alleviating/aggravating factors.    She is on EFFIENT.     Conservative measures:  Physical therapy: none Multimodal medical therapy including regular antiinflammatories: lidoderm patches, naproxen, prednisone  Injections: No ESIs   Past Surgery: No spinal surgery.   Exam: No exam done as this was a telephone encounter.     Imaging: Lumbar MRI dated 09/23/22:  FINDINGS: Segmentation:  Standard.   Alignment:  Physiologic.   Vertebrae:  No fracture, evidence of discitis, or bone lesion.   Conus medullaris and cauda equina: Conus extends to the T12-L1 level. Conus and cauda equina appear normal.   Paraspinal and other soft tissues: Negative.   Disc levels:   L3-4 disc desiccation and narrowing with central protrusion. Mild facet spurring. Mild right subarticular recess narrowing.   L4-5 disc desiccation  and narrowing with central protrusion. Facet spurring and ligamentum flavum thickening. Combined with short pedicles there is advanced spinal stenosis.   L5-S1: Disc narrowing and bulging with central to right paracentral protrusion contacting the right S1 nerve root but not deflecting it. Mild facet spurring   IMPRESSION: Lumbar spine degeneration at L3-4 and below exacerbated by short pedicles.   Advanced spinal stenosis at L4-5. A herniation at L5-S1 contacts the right L5 nerve root.     Electronically Signed   By: Tiburcio Pea M.D.   On: 09/26/2022 07:52    I have personally reviewed the images and agree with the above interpretation.  Assessment and Plan: Sonya [name] is a pleasant 42 y.o. female with intermittent LBP and left posterior leg pain/numbness.   She has known lumbar spondylosis with short pedicles. She has moderate central stenosis L4-L5 with mild central stenosis at L3-L4 and L5-S1. Also with disc at L5-S1 that contacts right L5 nerve root.   Treatment options discussed with patient and following plan made:   - PT for lumbar spine recommended. She declines for now. We discussed she would need to do PT prior to consideration of surgery.  - Referral to PMR at [facility] to discuss possible lumbar injections.  - Follow up with me in 6 weeks for recheck.    Drake Leach PA-C Neurosurgery

## 2022-10-11 ENCOUNTER — Ambulatory Visit: Payer: Medicaid Other | Admitting: Orthopedic Surgery

## 2022-10-11 ENCOUNTER — Encounter: Payer: Self-pay | Admitting: Orthopedic Surgery

## 2022-10-11 DIAGNOSIS — M47816 Spondylosis without myelopathy or radiculopathy, lumbar region: Secondary | ICD-10-CM

## 2022-10-11 DIAGNOSIS — M5416 Radiculopathy, lumbar region: Secondary | ICD-10-CM

## 2022-10-11 DIAGNOSIS — M48061 Spinal stenosis, lumbar region without neurogenic claudication: Secondary | ICD-10-CM

## 2022-10-11 DIAGNOSIS — M4726 Other spondylosis with radiculopathy, lumbar region: Secondary | ICD-10-CM

## 2022-10-12 ENCOUNTER — Telehealth: Payer: Self-pay | Admitting: Orthopedic Surgery

## 2022-10-12 NOTE — Telephone Encounter (Signed)
Received note from Sonya [name] complete [employer] that she has been prescribed robaxin and flexeril.   I don't see flexeril on her list.   I called her and left a message to call back.   Please try to call her tomorrow and let her know that if she is taking robaxin/methocarbamol then she should not be taking flexeril/cyclobenzaprine.   Thanks.

## 2022-10-13 NOTE — Telephone Encounter (Signed)
Patient was advised of your medical advise, she agreed.

## 2022-10-13 NOTE — Telephone Encounter (Signed)
Spoke with patient.   Robaxin did not help. Another doctor gave her flexeril and this helps.   Will remove robaxin from her list and add flexeril. She thinks that she is on flexeril .

## 2022-10-15 ENCOUNTER — Other Ambulatory Visit: Payer: Self-pay | Admitting: Internal Medicine

## 2022-10-15 ENCOUNTER — Other Ambulatory Visit: Payer: Self-pay | Admitting: Cardiovascular Disease

## 2022-10-15 DIAGNOSIS — I2510 Atherosclerotic heart disease of native coronary artery without angina pectoris: Secondary | ICD-10-CM

## 2022-10-19 ENCOUNTER — Encounter: Payer: Self-pay | Admitting: Nurse Practitioner

## 2022-10-29 ENCOUNTER — Encounter: Payer: Self-pay | Admitting: Cardiovascular Disease

## 2022-10-29 ENCOUNTER — Encounter: Payer: Self-pay | Admitting: Nurse Practitioner

## 2022-10-30 NOTE — Telephone Encounter (Signed)
Okay to do work note for these days. Please let her know when it has been done.   Thanks.

## 2022-11-02 ENCOUNTER — Other Ambulatory Visit: Payer: Self-pay | Admitting: Cardiovascular Disease

## 2022-11-07 ENCOUNTER — Ambulatory Visit: Payer: Medicaid Other | Attending: Cardiovascular Disease | Admitting: Cardiovascular Disease

## 2022-11-07 ENCOUNTER — Encounter: Payer: Self-pay | Admitting: Cardiovascular Disease

## 2022-11-07 VITALS — BP 118/82 | HR 81 | Ht 71.0 in | Wt 280.0 lb

## 2022-11-07 DIAGNOSIS — E785 Hyperlipidemia, unspecified: Secondary | ICD-10-CM

## 2022-11-07 DIAGNOSIS — I25118 Atherosclerotic heart disease of native coronary artery with other forms of angina pectoris: Secondary | ICD-10-CM

## 2022-11-07 MED ORDER — ROSUVASTATIN CALCIUM 20 MG PO TABS: 20.0000 mg | ORAL_TABLET | Freq: Every day | ORAL | 3 refills | Status: DC

## 2022-11-07 NOTE — Progress Notes (Signed)
Cardiology Office Note   Date:  11/07/2022   ID:  Sonya [name], DOB [birth year], MRN [id number]  PCP:  Orson Eva, NP  Cardiologist:   Lorine Bears, MD   Chief Complaint  Patient presents with   New Patient (Initial Visit)    CAD Pt d/c atorvastatin pt would like to discuss leg pain. Meds reviewed verbally with pt.      History of Present Illness: Sonya [name] is a 42 y.o. female who is here today to establish cardiovascular care.  She was previously followed by Dr. Welton Flakes.  She has known history of coronary artery disease status post PCI, type 2 diabetes, essential hypertension, hyperlipidemia, tobacco use and GERD.  She had previous PCI and stent placement to the LAD and left circumflex most recently in October 2022.  She presented with inferior STEMI in April 2023.  Emergent cardiac catheterization showed patent stent in the LAD and left circumflex.  There was plaque rupture in the mid RCA with TIMI I flow.  In addition, there was 90% disease distally at the bifurcation.  She underwent PCI and drug-eluting stent placement to the mid as well as distal right coronary artery.  She was most recently hospitalized in October 2023 with non-ST elevation myocardial infarction. She underwent cardiac catheterization which showed patent stents with mild in-stent restenosis.  There was severe coronary spasm at the ostium of the right coronary artery that improved with intracoronary nitroglycerin.  She has known history of RCA spasm on previous catheterization.  She stopped taking atorvastatin due to left leg pain.  She started having left leg pain in January.  The pain starts in the lower back and radiates all the way down.  It happens both at rest or with exertion and especially in certain positions.  She denies chest pain or shortness of breath.  She quit smoking in October of last year. She had MRI of the spine done in March which showed evidence of spinal stenosis and disc herniation  at L4 and L5 on the left.  Past Medical History:  Diagnosis Date   Coronary artery disease    GERD (gastroesophageal reflux disease)    H/O heart artery stent    Hyperlipidemia    Hypertension    Tobacco abuse     Past Surgical History:  Procedure Laterality Date   CORONARY STENT INTERVENTION N/A 06/10/2018   Procedure: CORONARY STENT INTERVENTION;  Surgeon: Alwyn Pea, MD;  Location: [facility] INVASIVE CV LAB;  Service: Cardiovascular;  Laterality: N/A;   CORONARY STENT INTERVENTION N/A 04/14/2021   Procedure: CORONARY STENT INTERVENTION;  Surgeon: Marcina Millard, MD;  Location: [facility] INVASIVE CV LAB;  Service: Cardiovascular;  Laterality: N/A;   CORONARY STENT INTERVENTION N/A 01/20/2022   Procedure: CORONARY STENT INTERVENTION;  Surgeon: Marcina Millard, MD;  Location: [facility] INVASIVE CV LAB;  Service: Cardiovascular;  Laterality: N/A;   CORONARY/GRAFT ACUTE MI REVASCULARIZATION N/A 10/11/2021   Procedure: Coronary/Graft Acute MI Revascularization;  Surgeon: Yvonne Kendall, MD;  Location: [facility] INVASIVE CV LAB;  Service: Cardiovascular;  Laterality: N/A;   LEFT HEART CATH AND CORONARY ANGIOGRAPHY N/A 06/10/2018   Procedure: With coronary intervention and stenting;  Surgeon: Laurier Nancy, MD;  [facility] INVASIVE CV LAB;  Service: Cardiovascular;  Laterality: N/A;   LEFT HEART CATH AND CORONARY ANGIOGRAPHY Left 06/15/2020   Procedure: LEFT HEART CATH AND CORONARY ANGIOGRAPHY;  Surgeon: Laurier Nancy, MD;  Location: [facility] INVASIVE CV LAB;  Service: Cardiovascular;  Laterality: Left;  LEFT HEART CATH AND CORONARY ANGIOGRAPHY N/A 04/14/2021   Procedure: LEFT HEART CATH AND CORONARY ANGIOGRAPHY with intervention;  Surgeon: Laurier Nancy, MD;  Location: [facility] INVASIVE CV LAB;  Service: Cardiovascular;  Laterality: N/A;   LEFT HEART CATH AND CORONARY ANGIOGRAPHY N/A 10/11/2021   Procedure: LEFT HEART CATH AND CORONARY ANGIOGRAPHY;  Surgeon: Yvonne Kendall, MD;  Location:  [facility] INVASIVE CV LAB;  Service: Cardiovascular;  Laterality: N/A;   LEFT HEART CATH AND CORONARY ANGIOGRAPHY N/A 01/20/2022   Procedure: LEFT HEART CATH AND CORONARY ANGIOGRAPHY;  Surgeon: Laurier Nancy, MD;  Location: [facility] INVASIVE CV LAB;  Service: Cardiovascular;  Laterality: N/A;   LEFT HEART CATH AND CORONARY ANGIOGRAPHY N/A 04/05/2022   Procedure: LEFT HEART CATH AND CORONARY ANGIOGRAPHY;  Surgeon: Corky Crafts, MD;  Location[facility] CV LAB;  Service: Cardiovascular;  Laterality: N/A;   TUBAL LIGATION       Current Outpatient Medications  Medication Sig Dispense Refill   amLODipine (NORVASC) 5 MG tablet Take 1 tablet (5 mg total) by mouth daily. 30 tablet 2   aspirin (ASPIRIN CHILDRENS) 81 MG chewable tablet Chew 1 tablet (81 mg total) by mouth daily. 30 tablet 0   Bacillus Coagulans-Inulin (PROBIOTIC) 1-250 BILLION-MG CAPS Take 1 capsule by mouth daily. 30 capsule 0   busPIRone (BUSPAR) 10 MG tablet Take 10 mg by mouth 2 (two) times daily as needed.     carvedilol (COREG) 12.5 MG tablet TAKE 1 TABLET BY MOUTH TWICE A DAY 180 tablet 1   Cholecalciferol (VITAMIN D) 125 MCG (5000 UT) CAPS Take 5,000 Units by mouth daily.     Cyclobenzaprine HCl (FLEXERIL PO) Take 5 mg by mouth. prn     enalapril (VASOTEC) 20 MG tablet TAKE 1 TABLET BY MOUTH EVERY DAY 90 tablet 3   isosorbide mononitrate (IMDUR) 120 MG 24 hr tablet TAKE 1 TABLET BY MOUTH TWICE A DAY 180 tablet 1   nitroGLYCERIN (NITROSTAT) 0.4 MG SL tablet Place 1 tablet (0.4 mg total) under the tongue every 5 (five) minutes as needed for chest pain. 30 tablet 12   nystatin cream (MYCOSTATIN) Apply 1 application topically at bedtime as needed (irritation).     pantoprazole (PROTONIX) 40 MG tablet Take 40 mg by mouth 2 (two) times daily as needed (heartburn).     prasugrel (EFFIENT) 10 MG TABS tablet Take 1 tablet (10 mg total) by mouth daily. 30 tablet 2   ranolazine (RANEXA) 1000 MG SR tablet TAKE 1 TABLET BY MOUTH TWICE A  DAY 180 tablet 1   No current facility-administered medications for this visit.    Allergies:   Penicillins, Atorvastatin calcium, Metoprolol, Penicillins, Repatha [evolocumab], and Zetia [ezetimibe]    Social History:  The patient  reports that she quit smoking about 19 months ago. Her smoking use included cigarettes. She has a 10.00 pack-year smoking history. She has never used smokeless tobacco. She reports current alcohol use. She reports that she does not use drugs.   Family History:  The patient's family history includes Heart attack in her father and mother.    ROS:  Please see the history of present illness.   Otherwise, review of systems are positive for none.   All other systems are reviewed and negative.    PHYSICAL EXAM: VS:  BP 118/82 (BP Location: Right Arm, Patient Position: Sitting, Cuff Size: Large)   Pulse 81   Ht 5\' 11"  (1.803 m)   Wt 280 lb (127 kg)   SpO2 97%  BMI 39.05 kg/m  , BMI Body mass index is 39.05 kg/m. GEN: Well nourished, well developed, in no acute distress  HEENT: normal  Neck: no JVD, carotid bruits, or masses Cardiac: RRR; no murmurs, rubs, or gallops,no edema  Respiratory:  clear to auscultation bilaterally, normal work of breathing GI: soft, nontender, nondistended, + BS MS: no deformity or atrophy  Skin: warm and dry, no rash Neuro:  Strength and sensation are intact Psych: euthymic mood, full affect Vascular: Dorsalis pedis and posterior tibial pulses are normal on both sides.   EKG:  EKG is ordered today. The ekg ordered today demonstrates normal sinus rhythm with old inferior infarct.   Recent Labs: 08/17/2022: ALT 14; BUN 16; Creatinine, Ser 0.82; Hemoglobin 11.4; Platelets 299; Potassium 3.6; Sodium 139; TSH 0.670    Lipid Panel    Component Value Date/Time   CHOL 164 08/17/2022 1148   TRIG 65 08/17/2022 1148   HDL 60 08/17/2022 1148   CHOLHDL 5.0 04/05/2022 0938   VLDL 12 04/05/2022 0938   LDLCALC 91 08/17/2022 1148       Wt Readings from Last 3 Encounters:  11/07/22 280 lb (127 kg)  09/11/22 272 lb (123.4 kg)  08/28/22 272 lb 14.9 oz (123.8 kg)          11/07/2022    3:20 PM  PAD Screen  Previous PAD dx? No  Previous surgical procedure? No  Pain with walking? No  Feet/toe relief with dangling? No  Painful, non-healing ulcers? No  Extremities discolored? No      ASSESSMENT AND PLAN:  1.  Coronary artery disease involving native coronary arteries with other forms of angina: Her symptoms are well-controlled on antianginal medications including isosorbide and Ranexa.  She had previous coronary spasm in the right coronary artery.  Continue carvedilol and amlodipine. She is on long-term dual antiplatelet therapy due to recurrent ischemic events and multiple coronary stents.  2.  Hyperlipidemia: I explained to her that left leg pain is not due to atorvastatin.  There is a strong indication for treatment with a statin given her extensive coronary artery disease.  I decided to start rosuvastatin 20 mg daily.  Check lipid and liver profile in 2 months.  3.  Left leg pain: Likely due to lumbar spine etiology and disc herniation.  No evidence of peripheral arterial disease by physical exam.  She is scheduled for steroid injection.  Effient can be held 7 days before.     Disposition:   FU with me in 3 months  Signed,  Lorine Bears, MD  11/07/2022 3:43 PM    [facility]

## 2022-11-07 NOTE — Patient Instructions (Addendum)
Medication Instructions:  START Rosuvastatin 20 mg once daily  Hold the Effient one week prior to the procedure.   *If you need a refill on your cardiac medications before your next appointment, please call your pharmacy*   Lab Work: Your provider would like for you to return in 2 months to have the following labs drawn: lipid and liver.   Please go to the [facility] entrance and check in at the front desk.  You do not need an appointment.  They are open from 7am-6 pm.  You will need to be fasting.  If you have labs (blood work) drawn today and your tests are completely normal, you will receive your results only by: MyChart Message (if you have MyChart) OR A paper copy in the mail If you have any lab test that is abnormal or we need to change your treatment, we will call you to review the results.   Testing/Procedures: None ordered   Follow-Up: At [facility], you and your health needs are our priority.  As part of our continuing mission to provide you with exceptional heart care, we have created designated Provider Care Teams.  These Care Teams include your primary Cardiologist (physician) and Advanced Practice Providers (APPs -  Physician Assistants and Nurse Practitioners) who all work together to provide you with the care you need, when you need it.  We recommend signing up for the patient portal called "MyChart".  Sign up information is provided on this After Visit Summary.  MyChart is used to connect with patients for Virtual Visits (Telemedicine).  Patients are able to view lab/test results, encounter notes, upcoming appointments, etc.  Non-urgent messages can be sent to your provider as well.   To learn more about what you can do with MyChart, go to [email or web address].    Your next appointment:   3 month(s)  Provider:   You may see Dr. Kirke Corin or one of the following Advanced Practice Providers on your designated Care Team:   Nicolasa Ducking,  NP Eula Listen, PA-C Cadence Fransico Michael, PA-C Charlsie Quest, NP

## 2022-11-08 ENCOUNTER — Telehealth: Payer: Self-pay | Admitting: Neurosurgery

## 2022-11-08 NOTE — Telephone Encounter (Signed)
Pt called in today stating that started a new job this past Monday and during her shift twisted resulting in bad back pain. Is not Scheduled for her next injection until 11/16/22. Was wanting to know if we could write her a work note excusing her absence until 11/20/2022.  States she sent a MyChart message regarding this issue this morning

## 2022-11-08 NOTE — Telephone Encounter (Signed)
Duplicate message. 

## 2022-11-08 NOTE — Telephone Encounter (Signed)
Okay to do work note.

## 2022-11-09 ENCOUNTER — Ambulatory Visit: Payer: Medicaid Other | Admitting: Nurse Practitioner

## 2022-11-21 ENCOUNTER — Ambulatory Visit: Payer: Medicaid Other | Admitting: Nurse Practitioner

## 2022-11-24 NOTE — Progress Notes (Unsigned)
Referring Physician:  Orson Eva, NP [address] [place],  [place] [address]  Primary Physician:  Orson Eva, NP  History of Present Illness: 11/24/2022 [name] has a history of CAD, hyperlipidemia, MI, HTN, and GERD. History of chronic back and leg pain.   Had phone visit with me on 10/11/22 to review her lumbar MRI.   She has known lumbar spondylosis with short pedicles. She has moderate central stenosis L4-L5 with mild central stenosis at L3-L4 and L5-S1. Also with disc at L5-S1 that contacts right L5 nerve root.   She was sent to PMR at her last visit. She had left L5-S1 TF and left S1 TF ESI by Dr. Mariah Milling on 11/16/22.   She is here for follow up.   She had great relief with injection and is 80% better! She has occasional numbness/tingling in left lower back and toes and right anterior thigh. Current pain is tolerable.   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   She is on EFFIENT.    Conservative measures:  Physical therapy: none Multimodal medical therapy including regular antiinflammatories: lidoderm patches, naproxen, prednisone  Injections:  left L5-S1 TF and left S1 TF ESI 11/16/22  Past Surgery: No spinal surgery  Sonya [name] has no symptoms of cervical myelopathy.  The symptoms are causing a significant impact on the patient's life.   Review of Systems:  A 10 point review of systems is negative, except for the pertinent positives and negatives detailed in the HPI.  Past Medical History: Past Medical History:  Diagnosis Date   Coronary artery disease    GERD (gastroesophageal reflux disease)    H/O heart artery stent    Hyperlipidemia    Hypertension    Tobacco abuse     Past Surgical History: Past Surgical History:  Procedure Laterality Date   CORONARY STENT INTERVENTION N/A 06/10/2018   Procedure: CORONARY STENT INTERVENTION;  Surgeon: Alwyn Pea, MD;  Location: [facility]IVE [facility];  Service: Cardiovascular;  Laterality: N/A;    CORONARY STENT INTERVENTION N/A 04/14/2021   Procedure: CORONARY STENT INTERVENTION;  Surgeon: Marcina Millard, MD;  Location: [facility];  Service: Cardiovascular;  Laterality: N/A;   CORONARY STENT INTERVENTION N/A 01/20/2022   Procedure: CORONARY STENT INTERVENTION;  Surgeon: Marcina Millard, MD;  Location: [facility];  Service: Cardiovascular;  Laterality: N/A;   CORONARY/GRAFT ACUTE MI REVASCULARIZATION N/A 10/11/2021   Procedure: Coronary/Graft Acute MI Revascularization;  Surgeon: Yvonne Kendall, MD;  Location: [facility];  Service: Cardiovascular;  Laterality: N/A;   LEFT HEART CATH AND CORONARY ANGIOGRAPHY N/A 06/10/2018   Procedure: With coronary intervention and stenting;  Surgeon: Laurier Nancy, MD;  Location: [facility]VASIVE CV [facility];  Service: Cardiovascular;  Laterality: N/A;   LEFT HEART CATH AND CORONARY ANGIOGRAPHY Left 06/15/2020   Procedure: LEFT HEART CATH AND CORONARY ANGIOGRAPHY;  Surgeon: Laurier Nancy, MD;  Location: [facility];  Service: Cardiovascular;  Laterality: Left;   LEFT HEART CATH AND CORONARY ANGIOGRAPHY N/A 04/14/2021   Procedure: LEFT HEART CATH AND CORONARY ANGIOGRAPHY with intervention;  Surgeon: Laurier Nancy, MD;  Location: [facility];  Service: Cardiovascular;  Laterality: N/A;   LEFT HEART CATH AND CORONARY ANGIOGRAPHY N/A 10/11/2021   Procedure: LEFT HEART CATH AND CORONARY ANGIOGRAPHY;  Surgeon: Yvonne Kendall, MD;  Location: [facility];  Service: Cardiovascular;  Laterality: N/A;   LEFT HEART CATH AND CORONARY ANGIOGRAPHY N/A 01/20/2022   Procedure: LEFT  HEART CATH AND CORONARY ANGIOGRAPHY;  Surgeon: Laurier Nancy, MD;  Location: [facility];  Service: Cardiovascular;  Laterality: N/A;   LEFT HEART CATH AND CORONARY ANGIOGRAPHY N/A 04/05/2022   Procedure: LEFT HEART CATH AND CORONARY ANGIOGRAPHY;  Surgeon: Corky Crafts, MD;  Location: [facility] INVASIVE CV LAB;   Service: Cardiovascular;  Laterality: N/A;   TUBAL LIGATION      Allergies: Allergies as of 11/29/2022 - Review Complete 11/07/2022  Allergen Reaction Noted   Penicillins Hives 02/06/2017   Atorvastatin calcium Other (See Comments) 11/02/2022   Metoprolol Hives 10/11/2021   Penicillins Other (See Comments) 04/05/2022   Repatha [evolocumab]  07/28/2022   Zetia [ezetimibe] Rash 07/28/2022    Medications: Outpatient Encounter Medications as of 11/29/2022  Medication Sig   amLODipine (NORVASC) 5 MG tablet Take 1 tablet (5 mg total) by mouth daily.   aspirin (ASPIRIN CHILDRENS) 81 MG chewable tablet Chew 1 tablet (81 mg total) by mouth daily.   Bacillus Coagulans-Inulin (PROBIOTIC) 1-250 BILLION-MG CAPS Take 1 capsule by mouth daily.   busPIRone (BUSPAR) 10 MG tablet Take 10 mg by mouth 2 (two) times daily as needed.   carvedilol (COREG) 12.5 MG tablet TAKE 1 TABLET BY MOUTH TWICE A DAY   Cholecalciferol (VITAMIN D) 125 MCG (5000 UT) CAPS Take 5,000 Units by mouth daily.   Cyclobenzaprine HCl (FLEXERIL PO) Take 5 mg by mouth. prn   enalapril (VASOTEC) 20 MG tablet TAKE 1 TABLET BY MOUTH EVERY DAY   isosorbide mononitrate (IMDUR) 120 MG 24 hr tablet TAKE 1 TABLET BY MOUTH TWICE A DAY   nitroGLYCERIN (NITROSTAT) 0.4 MG SL tablet Place 1 tablet (0.4 mg total) under the tongue every 5 (five) minutes as needed for chest pain.   nystatin cream (MYCOSTATIN) Apply 1 application topically at bedtime as needed (irritation).   pantoprazole (PROTONIX) 40 MG tablet Take 40 mg by mouth 2 (two) times daily as needed (heartburn).   prasugrel (EFFIENT) 10 MG TABS tablet Take 1 tablet (10 mg total) by mouth daily.   ranolazine (RANEXA) 1000 MG SR tablet TAKE 1 TABLET BY MOUTH TWICE A DAY   rosuvastatin (CRESTOR) 20 MG tablet Take 1 tablet (20 mg total) by mouth daily.   No facility-administered encounter medications on file as of 11/29/2022.    Social History: Social History   Tobacco Use   Smoking  status: Former    Packs/day: 0.50    Years: 20.00    Additional pack years: 0.00    Total pack years: 10.00    Types: Cigarettes    Quit date: 03/2021    Years since quitting: 1.6   Smokeless tobacco: Never  Vaping Use   Vaping Use: Former  Substance Use Topics   Alcohol use: Yes    Comment: occasionally   Drug use: Never    Family Medical History: Family History  Problem Relation Age of Onset   Heart attack Mother    Heart attack Father     Physical Examination: There were no vitals filed for this visit.    Awake, alert, oriented to person, place, and time.  Speech is clear and fluent. Fund of knowledge is appropriate.   Cranial Nerves: Pupils equal round and reactive to light.  Facial tone is symmetric.    No posterior lumbar tenderness.   No abnormal lesions on exposed skin.   Strength:  Side Iliopsoas Quads Hamstring PF DF EHL  R 5 5 5 5 5 5   L 5 5 5  5 5 5     Bilateral lower extremity sensation is intact to light touch.     She has normal gait.   Medical Decision Making  Imaging: none  Assessment and Plan: Ms. Angelopoulos is a pleasant 42 y.o. femalehad great relief with injection and is 80% better! She has occasional numbness/tingling in left lower back and toes and right anterior thigh. Current pain is tolerable.     has constant low back pain with posterior left leg pain to her heel x 1-2 months. No right leg pain. She has numbness and tingling in left leg. May have some weakness. She also has left knee pain.   She has known lumbar spondylosis with short pedicles. She has moderate central stenosis L4-L5 with mild central stenosis at L3-L4 and L5-S1. Also with disc at L5-S1 that contacts right L5 nerve root.   Treatment options discussed with patient and following plan made:   - Follow up with PMR as scheduled.  - Can repeat injections if needed.  - Discussed PT- she declines for now. Will let me know if this changes.  - She will f/u with me prn.   I  spent a total of 10 minutes in face-to-face and non-face-to-face activities related to this patient's care today including review of outside records, review of imaging, review of symptoms, physical exam, discussion of differential diagnosis, discussion of treatment options, and documentation.   Drake Leach PA-C Dept. of Neurosurgery

## 2022-11-27 NOTE — Telephone Encounter (Signed)
Okay to give her work note from 6/1 until her f/u with me on 6/5. Please let her know when it is done.

## 2022-11-28 ENCOUNTER — Ambulatory Visit: Payer: Medicaid Other | Admitting: Nurse Practitioner

## 2022-11-29 ENCOUNTER — Encounter: Payer: Self-pay | Admitting: Orthopedic Surgery

## 2022-11-29 ENCOUNTER — Ambulatory Visit (INDEPENDENT_AMBULATORY_CARE_PROVIDER_SITE_OTHER): Payer: Medicaid Other | Admitting: Orthopedic Surgery

## 2022-11-29 VITALS — BP 115/70 | HR 81 | Ht 72.0 in | Wt 281.8 lb

## 2022-11-29 DIAGNOSIS — M4726 Other spondylosis with radiculopathy, lumbar region: Secondary | ICD-10-CM | POA: Diagnosis not present

## 2022-11-29 DIAGNOSIS — M47816 Spondylosis without myelopathy or radiculopathy, lumbar region: Secondary | ICD-10-CM

## 2022-11-29 DIAGNOSIS — M48061 Spinal stenosis, lumbar region without neurogenic claudication: Secondary | ICD-10-CM

## 2022-11-29 DIAGNOSIS — M5416 Radiculopathy, lumbar region: Secondary | ICD-10-CM

## 2022-12-06 ENCOUNTER — Ambulatory Visit: Payer: Medicaid Other | Admitting: Cardiology

## 2022-12-06 ENCOUNTER — Emergency Department
Admission: EM | Admit: 2022-12-06 | Discharge: 2022-12-06 | Discharge status: Against Medical Advice | Payer: Medicaid Other | Attending: Emergency Medicine | Admitting: Emergency Medicine

## 2022-12-06 ENCOUNTER — Other Ambulatory Visit: Payer: Self-pay

## 2022-12-06 DIAGNOSIS — Z5321 Procedure and treatment not carried out due to patient leaving prior to being seen by health care provider: Secondary | ICD-10-CM | POA: Insufficient documentation

## 2022-12-06 DIAGNOSIS — M549 Dorsalgia, unspecified: Secondary | ICD-10-CM | POA: Insufficient documentation

## 2022-12-06 NOTE — ED Triage Notes (Signed)
PT in with co back pain hx of bulging disc and injections. Denies any recent injury. Pain radiates to left buttocks and leg.

## 2022-12-07 ENCOUNTER — Ambulatory Visit: Payer: Medicaid Other | Admitting: Nurse Practitioner

## 2022-12-13 ENCOUNTER — Ambulatory Visit (INDEPENDENT_AMBULATORY_CARE_PROVIDER_SITE_OTHER): Payer: Medicaid Other | Admitting: Cardiology

## 2022-12-13 ENCOUNTER — Encounter: Payer: Self-pay | Admitting: Cardiology

## 2022-12-13 VITALS — BP 115/80 | HR 97 | Ht 71.0 in | Wt 280.0 lb

## 2022-12-13 DIAGNOSIS — E782 Mixed hyperlipidemia: Secondary | ICD-10-CM | POA: Diagnosis not present

## 2022-12-13 DIAGNOSIS — E669 Obesity, unspecified: Secondary | ICD-10-CM

## 2022-12-13 DIAGNOSIS — I10 Essential (primary) hypertension: Secondary | ICD-10-CM

## 2022-12-13 DIAGNOSIS — Z720 Tobacco use: Secondary | ICD-10-CM | POA: Diagnosis not present

## 2022-12-13 DIAGNOSIS — I2510 Atherosclerotic heart disease of native coronary artery without angina pectoris: Secondary | ICD-10-CM | POA: Diagnosis not present

## 2022-12-13 MED ORDER — EVOLOCUMAB 140 MG/ML ~~LOC~~ SOAJ: 140.0000 mg | SUBCUTANEOUS | 5 refills | Status: DC

## 2022-12-13 MED ORDER — ATORVASTATIN CALCIUM 80 MG PO TABS: 80.0000 mg | ORAL_TABLET | Freq: Every day | ORAL | 11 refills | Status: DC

## 2022-12-13 MED ORDER — AMLODIPINE BESYLATE 5 MG PO TABS: 5.0000 mg | ORAL_TABLET | Freq: Every day | ORAL | 11 refills | Status: DC

## 2022-12-13 NOTE — Assessment & Plan Note (Signed)
Patient feeling well. Denies chest pain, shortness of breath.  

## 2022-12-13 NOTE — Assessment & Plan Note (Signed)
LDL much improved on Repatha, still not at goal. Continue same medications and recheck prior to next PCP visit.

## 2022-12-13 NOTE — Assessment & Plan Note (Signed)
Smoking cessation instruction/counseling given:  commended patient for quitting and reviewed strategies for preventing relapses 

## 2022-12-13 NOTE — Assessment & Plan Note (Signed)
The patient is asked to make an attempt to improve diet and exercise patterns to aid in medical management of this problem.  

## 2022-12-13 NOTE — Progress Notes (Signed)
Cardiology Office Note   Date:  12/13/2022   ID:  AIZLEE NAVEDO, DOB 1981/04/22, MRN [id number]  PCP:  Orson Eva, NP  Cardiologist:  Marisue Ivan, NP      History of Present Illness: Sonya [name] is a 42 y.o. female who presents for  Chief Complaint  Patient presents with   Follow-up    Follow up    Patient in office for routine cardiac exam. Denies chest pain, shortness of breath, dizziness, lower extremity edema.     Past Medical History:  Diagnosis Date   Coronary artery disease    GERD (gastroesophageal reflux disease)    H/O heart artery stent    Hyperlipidemia    Hypertension    Tobacco abuse      Past Surgical History:  Procedure Laterality Date   CORONARY STENT INTERVENTION N/A 06/10/2018   Procedure: CORONARY STENT INTERVENTION;  Surgeon: Alwyn Pea, MD;  Location: [facility];  Service: Cardiovascular;  Laterality: N/A;   CORONARY STENT INTERVENTION N/A 04/14/2021   Procedure: CORONARY STENT INTERVENTION;  Surgeon: Marcina Millard, MD;  Location: [facility];  Service: Cardiovascular;  Laterality: N/A;   CORONARY STENT INTERVENTION N/A 01/20/2022   Procedure: CORONARY STENT INTERVENTION;  Surgeon: Marcina Millard, MD;  Location: [facility];  Service: Cardiovascular;  Laterality: N/A;   CORONARY/GRAFT ACUTE MI REVASCULARIZATION N/A 10/11/2021   Procedure: Coronary/Graft Acute MI Revascularization;  Surgeon: Yvonne Kendall, MD;  Location: [facility];  Service: Cardiovascular;  Laterality: N/A;   LEFT HEART CATH AND CORONARY ANGIOGRAPHY N/A 06/10/2018   Procedure: With coronary intervention and stenting;  Surgeon: Laurier Nancy, MD;  Location: [facility] IN[facility] CV [facility];  Service: Cardiovascular;  Laterality: N/A;   LEFT HEART CATH AND CORONARY ANGIOGRAPHY Left 06/15/2020   Procedure: LEFT HEART CATH AND CORONARY ANGIOGRAPHY;  Surgeon: Laurier Nancy, MD;  Location: [facility];   Service: Cardiovascular;  Laterality: Left;   LEFT HEART CATH AND CORONARY ANGIOGRAPHY N/A 04/14/2021   Procedure: LEFT HEART CATH AND CORONARY ANGIOGRAPHY with intervention;  Surgeon: Laurier Nancy, MD;  Location: [facility];  Service: Cardiovascular;  Laterality: N/A;   LEFT HEART CATH AND CORONARY ANGIOGRAPHY N/A 10/11/2021   Procedure: LEFT HEART CATH AND CORONARY ANGIOGRAPHY;  Surgeon: Yvonne Kendall, MD;  Location: [facility];  Service: Cardiovascular;  Laterality: N/A;   LEFT HEART CATH AND CORONARY ANGIOGRAPHY N/A 01/20/2022   Procedure: LEFT HEART CATH AND CORONARY ANGIOGRAPHY;  Surgeon: Laurier Nancy, MD;  Location: [facility];  Service: Cardiovascular;  Laterality: N/A;   LEFT HEART CATH AND CORONARY ANGIOGRAPHY N/A 04/05/2022   Procedure: LEFT HEART CATH AND CORONARY ANGIOGRAPHY;  Surgeon: Corky Crafts, MD;  Location: [facility] INVASIVE CV LAB;  Service: Cardiovascular;  Laterality: N/A;   TUBAL LIGATION       Current Outpatient Medications  Medication Sig Dispense Refill   amLODipine (NORVASC) 5 MG tablet Take 1 tablet (5 mg total) by mouth daily. 30 tablet 11   atorvastatin (LIPITOR) 80 MG tablet Take 1 tablet (80 mg total) by mouth daily. 30 tablet 11   Evolocumab 140 MG/ML SOAJ Inject 140 mg into the skin every 14 (fourteen) days. 2 mL 5   aspirin (ASPIRIN CHILDRENS) 81 MG chewable tablet Chew 1 tablet (81 mg total) by mouth daily. 30 tablet 0   Bacillus Coagulans-Inulin (PROBIOTIC) 1-250 BILLION-MG CAPS Take 1 capsule by mouth  daily. 30 capsule 0   busPIRone (BUSPAR) 10 MG tablet Take 10 mg by mouth 2 (two) times daily as needed.     carvedilol (COREG) 12.5 MG tablet TAKE 1 TABLET BY MOUTH TWICE A DAY 180 tablet 1   Cholecalciferol (VITAMIN D) 125 MCG (5000 UT) CAPS Take 5,000 Units by mouth daily.     Cyclobenzaprine HCl (FLEXERIL PO) Take 5 mg by mouth. prn     isosorbide mononitrate (IMDUR) 120 MG 24 hr tablet TAKE 1 TABLET BY MOUTH TWICE  A DAY 180 tablet 1   nitroGLYCERIN (NITROSTAT) 0.4 MG SL tablet Place 1 tablet (0.4 mg total) under the tongue every 5 (five) minutes as needed for chest pain. 30 tablet 12   nystatin cream (MYCOSTATIN) Apply 1 application topically at bedtime as needed (irritation).     pantoprazole (PROTONIX) 40 MG tablet Take 40 mg by mouth 2 (two) times daily as needed (heartburn).     prasugrel (EFFIENT) 10 MG TABS tablet Take 1 tablet (10 mg total) by mouth daily. 30 tablet 2   ranolazine (RANEXA) 1000 MG SR tablet TAKE 1 TABLET BY MOUTH TWICE A DAY 180 tablet 1   No current facility-administered medications for this visit.    Allergies:   Penicillins, Atorvastatin calcium, Metoprolol, Penicillins, Repatha [evolocumab], and Zetia [ezetimibe]    Social History:   reports that she quit smoking about 20 months ago. Her smoking use included cigarettes. She has a 10.00 pack-year smoking history. She has never used smokeless tobacco. She reports current alcohol use. She reports that she does not use drugs.   Family History:  family history includes Heart attack in her father and mother.    ROS:     Review of Systems  Constitutional: Negative.   HENT: Negative.    Eyes: Negative.   Respiratory: Negative.    Cardiovascular: Negative.   Gastrointestinal: Negative.   Skin: Negative.   Neurological:  Negative for dizziness.  All other systems reviewed and are negative.    All other systems are reviewed and negative.    PHYSICAL EXAM: VS:  BP 115/80   Pulse 97   Ht 5\' 11"  (1.803 m)   Wt 127 kg   LMP 11/29/2022 (Exact Date)   SpO2 98%   BMI 39.05 kg/m  , BMI Body mass index is 39.05 kg/m. Last weight:  Wt Readings from Last 3 Encounters:  12/13/22 127 kg  12/06/22 127 kg  11/29/22 127.8 kg    Physical Exam Vitals and nursing note reviewed.  Constitutional:      Appearance: Normal appearance. She is normal weight.  HENT:     Head: Normocephalic and atraumatic.  Cardiovascular:      Rate and Rhythm: Normal rate and regular rhythm.     Pulses: Normal pulses.     Heart sounds: Normal heart sounds.  Pulmonary:     Effort: Pulmonary effort is normal.     Breath sounds: Normal breath sounds.  Abdominal:     General: Abdomen is flat.     Palpations: Abdomen is soft.  Musculoskeletal:        General: Normal range of motion.     Cervical back: Normal range of motion.  Skin:    General: Skin is warm and dry.  Neurological:     General: No focal deficit present.     Mental Status: She is alert and oriented to person, place, and time. Mental status is at baseline.  Psychiatric:  Mood and Affect: Mood normal.        Behavior: Behavior normal.     EKG: none today  Recent Labs: 08/17/2022: ALT 14; BUN 16; Creatinine, Ser 0.82; Hemoglobin 11.4; Platelets 299; Potassium 3.6; Sodium 139; TSH 0.670    Lipid Panel    Component Value Date/Time   CHOL 164 08/17/2022 1148   TRIG 65 08/17/2022 1148   HDL 60 08/17/2022 1148   CHOLHDL 5.0 04/05/2022 0938   VLDL 12 04/05/2022 0938   LDLCALC 91 08/17/2022 1148    ASSESSMENT AND PLAN:    ICD-10-CM   1. Coronary artery disease involving native coronary artery of native heart without angina pectoris  I25.10     2. Primary hypertension  I10     3. Mixed hyperlipidemia  E78.2     4. Tobacco abuse  Z72.0     5. Obesity (BMI 35.0-39.9 without comorbidity)  E66.9        Problem List Items Addressed This Visit       Cardiovascular and Mediastinum   Coronary artery disease - Primary    Patient feeling well. Denies chest pain, shortness of breath.       Relevant Medications   Evolocumab 140 MG/ML SOAJ   amLODipine (NORVASC) 5 MG tablet   atorvastatin (LIPITOR) 80 MG tablet   HTN (hypertension)    Well controlled today. Continue same medications.       Relevant Medications   Evolocumab 140 MG/ML SOAJ   amLODipine (NORVASC) 5 MG tablet   atorvastatin (LIPITOR) 80 MG tablet     Other   Tobacco abuse     Smoking cessation instruction/counseling given:  commended patient for quitting and reviewed strategies for preventing relapses       HLD (hyperlipidemia)    LDL much improved on Repatha, still not at goal. Continue same medications and recheck prior to next PCP visit.       Relevant Medications   Evolocumab 140 MG/ML SOAJ   amLODipine (NORVASC) 5 MG tablet   atorvastatin (LIPITOR) 80 MG tablet   Obesity (BMI 35.0-39.9 without comorbidity)    The patient is asked to make an attempt to improve diet and exercise patterns to aid in medical management of this problem.        Disposition:   Return in about 3 months (around 03/15/2023).    Total time spent: 30 minutes  Signed,  [employer], NP  12/13/2022 2:20 PM    [facility]

## 2022-12-13 NOTE — Assessment & Plan Note (Signed)
Well controlled today. Continue same medications.  

## 2022-12-22 ENCOUNTER — Ambulatory Visit: Payer: Medicaid Other | Admitting: Nurse Practitioner

## 2023-01-12 ENCOUNTER — Ambulatory Visit: Payer: Medicaid Other | Admitting: Cardiology

## 2023-01-29 ENCOUNTER — Other Ambulatory Visit: Payer: Self-pay | Admitting: Cardiovascular Disease

## 2023-02-01 ENCOUNTER — Ambulatory Visit: Payer: Medicaid Other | Admitting: Cardiology

## 2023-02-08 ENCOUNTER — Ambulatory Visit: Payer: Medicaid Other | Admitting: Cardiovascular Disease

## 2023-02-08 ENCOUNTER — Ambulatory Visit (INDEPENDENT_AMBULATORY_CARE_PROVIDER_SITE_OTHER): Payer: Medicaid Other | Admitting: Cardiovascular Disease

## 2023-02-08 ENCOUNTER — Encounter: Payer: Self-pay | Admitting: Cardiovascular Disease

## 2023-02-08 VITALS — BP 125/89 | HR 87 | Ht 71.0 in | Wt 282.0 lb

## 2023-02-08 DIAGNOSIS — R0789 Other chest pain: Secondary | ICD-10-CM

## 2023-02-08 DIAGNOSIS — I10 Essential (primary) hypertension: Secondary | ICD-10-CM | POA: Diagnosis not present

## 2023-02-08 DIAGNOSIS — I2510 Atherosclerotic heart disease of native coronary artery without angina pectoris: Secondary | ICD-10-CM | POA: Diagnosis not present

## 2023-02-08 DIAGNOSIS — I200 Unstable angina: Secondary | ICD-10-CM | POA: Diagnosis not present

## 2023-02-08 MED ORDER — CARVEDILOL 25 MG PO TABS: 25.0000 mg | ORAL_TABLET | Freq: Two times a day (BID) | ORAL | 11 refills | Status: DC

## 2023-02-08 NOTE — Progress Notes (Deleted)
Cardiology Office Note   Date:  02/08/2023   ID:  [name], DOB June 20, 1981, MRN [id number]  PCP:  Orson Eva, NP  Cardiologist:   Lorine Bears, MD   No chief complaint on file.     History of Present Illness: Sonya [name] is a 42 y.o. female who is here today to establish cardiovascular care.  She was previously followed by Dr. Welton Flakes.  She has known history of coronary artery disease status post PCI, type 2 diabetes, essential hypertension, hyperlipidemia, tobacco use and GERD.  She had previous PCI and stent placement to the LAD and left circumflex most recently in October 2022.  She presented with inferior STEMI in April 2023.  Emergent cardiac catheterization showed patent stent in the LAD and left circumflex.  There was plaque rupture in the mid RCA with TIMI I flow.  In addition, there was 90% disease distally at the bifurcation.  She underwent PCI and drug-eluting stent placement to the mid as well as distal right coronary artery.  She was most recently hospitalized in October 2023 with non-ST elevation myocardial infarction. She underwent cardiac catheterization which showed patent stents with mild in-stent restenosis.  There was severe coronary spasm at the ostium of the right coronary artery that improved with intracoronary nitroglycerin.  She has known history of RCA spasm on previous catheterization.  She stopped taking atorvastatin due to left leg pain.  She started having left leg pain in January.  The pain starts in the lower back and radiates all the way down.  It happens both at rest or with exertion and especially in certain positions.  She denies chest pain or shortness of breath.  She quit smoking in October of last year. 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  Past Medical History:  Diagnosis Date   Coronary artery disease    GERD (gastroesophageal reflux disease)     H/O heart artery stent    Hyperlipidemia    Hypertension    Tobacco abuse     Past Surgical History:  Procedure Laterality Date   CORONARY STENT INTERVENTION N/A 06/10/2018   Procedure: CORONARY STENT INTERVENTION;  Surgeon: Alwyn Pea, MD;  Location: [facility] INVASIVE CV [facility];  Service: Cardiovascular;  Laterality: N/A;   CORONARY STENT INTERVENTION N/A 04/14/2021   Procedure: CORONARY STENT INTERVENTION;  Surgeon: Marcina Millard, MD;  Location: [facility];  Service: Cardiovascular;  Laterality: N/A;   CORONARY STENT INTERVENTION N/A 01/20/2022   Procedure: CORONARY STENT INTERVENTION;  Surgeon: Marcina Millard, MD;  Location: [facility];  Service: Cardiovascular;  Laterality: N/A;   CORONARY/GRAFT ACUTE MI REVASCULARIZATION N/A 10/11/2021   Procedure: Coronary/Graft Acute MI Revascularization;  Surgeon: Yvonne Kendall, MD;  Location: [facility];  Service: Cardiovascular;  Laterality: N/A;   LEFT HEART CATH AND CORONARY ANGIOGRAPHY N/A 06/10/2018   Procedure: With coronary intervention and stenting;  Surgeon: Laurier Nancy, MD;  Location: [facility] INVASIVE CV LAB;  Service: Cardiovascular;  Laterality: N/A;   LEFT HEART CATH AND CORONARY ANGIOGRAPHY Left 06/15/2020   Procedure: LEFT HEART CATH AND CORONARY ANGIOGRAPHY;  Surgeon: Laurier Nancy, MD;  Location: [facility];  Service: Cardiovascular;  Laterality: Left;   LEFT HEART CATH AND CORONARY ANGIOGRAPHY N/A 04/14/2021   Procedure: LEFT HEART CATH AND CORONARY ANGIOGRAPHY with intervention;  Surgeon: Laurier Nancy, MD;  Location:  [facility];  Service: Cardiovascular;  Laterality: N/A;   LEFT HEART CATH AND CORONARY ANGIOGRAPHY N/A 10/11/2021   Procedure: LEFT HEART CATH AND CORONARY ANGIOGRAPHY;  Surgeon: Yvonne Kendall, MD;  Location: [facility];  Service: Cardiovascular;  Laterality: N/A;   LEFT HEART CATH AND CORONARY ANGIOGRAPHY N/A 01/20/2022   Procedure:  LEFT HEART CATH AND CORONARY ANGIOGRAPHY;  Surgeon: Laurier Nancy, MD;  Location: [facility];  Service: Cardiovascular;  Laterality: N/A;   LEFT HEART CATH AND CORONARY ANGIOGRAPHY N/A 04/05/2022   Procedure: LEFT HEART CATH AND CORONARY ANGIOGRAPHY;  Surgeon: Corky Crafts, MD;  Location: [facility] INV[facility]IVE CV LAB;  Service: Cardiovascular;  Laterality: N/A;   TUBAL LIGATION       Current Outpatient Medications  Medication Sig Dispense Refill   amLODipine (NORVASC) 5 MG tablet Take 1 tablet (5 mg total) by mouth daily. 30 tablet 11   aspirin (ASPIRIN CHILDRENS) 81 MG chewable tablet Chew 1 tablet (81 mg total) by mouth daily. 30 tablet 0   atorvastatin (LIPITOR) 80 MG tablet Take 1 tablet (80 mg total) by mouth daily. 30 tablet 11   Bacillus Coagulans-Inulin (PROBIOTIC) 1-250 BILLION-MG CAPS Take 1 capsule by mouth daily. 30 capsule 0   busPIRone (BUSPAR) 10 MG tablet Take 10 mg by mouth 2 (two) times daily as needed.     carvedilol (COREG) 12.5 MG tablet TAKE 1 TABLET BY MOUTH TWICE A DAY 180 tablet 1   Cholecalciferol (VITAMIN D) 125 MCG (5000 UT) CAPS Take 5,000 Units by mouth daily.     Cyclobenzaprine HCl (FLEXERIL PO) Take 5 mg by mouth. prn     Evolocumab 140 MG/ML SOAJ Inject 140 mg into the skin every 14 (fourteen) days. 2 mL 5   isosorbide mononitrate (IMDUR) 120 MG 24 hr tablet TAKE 1 TABLET BY MOUTH TWICE A DAY 180 tablet 1   nitroGLYCERIN (NITROSTAT) 0.4 MG SL tablet Place 1 tablet (0.4 mg total) under the tongue every 5 (five) minutes as needed for chest pain. 30 tablet 12   nystatin cream (MYCOSTATIN) Apply 1 application topically at bedtime as needed (irritation).     pantoprazole (PROTONIX) 40 MG tablet Take 40 mg by mouth 2 (two) times daily as needed (heartburn).     prasugrel (EFFIENT) 10 MG TABS tablet TAKE 1 TABLET BY MOUTH EVERY DAY 90 tablet 1   ranolazine (RANEXA) 1000 MG SR tablet TAKE 1 TABLET BY MOUTH TWICE A DAY 180 tablet 1   No current  facility-administered medications for this visit.    Allergies:   Penicillins, Atorvastatin calcium, Metoprolol, Penicillins, Repatha [evolocumab], and Zetia [ezetimibe]    Social History:  The patient  reports that she quit smoking about 22 months ago. Her smoking use included cigarettes. She started smoking about 21 years ago. She has a 10 pack-year smoking history. She has never used smokeless tobacco. She reports current alcohol use. She reports that she does not use drugs.   Family History:  The patient's family history includes Heart attack in her father and mother.    ROS:  Please see the history of present illness.   Otherwise, review of systems are positive for none.   All other systems are reviewed and negative.    PHYSICAL EXAM: VS:  There were no vitals taken for this visit. , BMI There is no height or weight on file to calculate BMI. GEN: Well nourished, well developed, in no acute distress  HEENT: normal  Neck:  no JVD, carotid bruits, or masses Cardiac: RRR; no murmurs, rubs, or gallops,no edema  Respiratory:  clear to auscultation bilaterally, normal work of breathing GI: soft, nontender, nondistended, + BS MS: no deformity or atrophy  Skin: warm and dry, no rash Neuro:  Strength and sensation are intact Psych: euthymic mood, full affect Vascular: Dorsalis pedis and posterior tibial pulses are normal on both sides.   EKG:  EKG is ordered today. The ekg ordered today demonstrates normal sinus rhythm with old inferior infarct.   Recent Labs: 08/17/2022: ALT 14; BUN 16; Creatinine, Ser 0.82; Hemoglobin 11.4; Platelets 299; Potassium 3.6; Sodium 139; TSH 0.670    Lipid Panel    Component Value Date/Time   CHOL 164 08/17/2022 1148   TRIG 65 08/17/2022 1148   HDL 60 08/17/2022 1148   CHOLHDL 5.0 04/05/2022 0938   VLDL 12 04/05/2022 0938   LDLCALC 91 08/17/2022 1148      Wt Readings from Last 3 Encounters:  12/13/22 280 lb (127 kg)  12/06/22 280 lb (127 kg)   11/29/22 281 lb 12.8 oz (127.8 kg)          11/07/2022    3:20 PM  PAD Screen  Previous PAD dx? No  Previous surgical procedure? No  Pain with walking? No  Feet/toe relief with dangling? No  Painful, non-healing ulcers? No  Extremities discolored? No      ASSESSMENT AND PLAN:  1.  Coronary artery disease involving native coronary arteries with other forms of angina: Her symptoms are well-controlled on antianginal medications including isosorbide and Ranexa.  She had previous coronary spasm in the right coronary artery.  Continue carvedilol and amlodipine. She is on long-term dual antiplatelet therapy due to recurrent ischemic events and multiple coronary stents.  2.  Hyperlipidemia: I explained to her that left leg pain is not due to atorvastatin.  There is a strong indication for treatment with a statin given her extensive coronary artery disease.  I decided to start rosuvastatin 20 mg daily.  Check lipid and liver profile in 2 months.  3.  Left leg pain: Likely due to lumbar spine etiology and disc herniation.  No evidence of peripheral arterial disease by physical exam.  She is scheduled for steroid injection.  Effient can be held 7 days before.     Disposition:   FU with me in 3 months  Signed,  Lorine Bears, MD  02/08/2023 9:08 AM    [facility]

## 2023-02-08 NOTE — Assessment & Plan Note (Signed)
stable °

## 2023-02-08 NOTE — Progress Notes (Signed)
Cardiology Office Note   Date:  02/08/2023   ID:  Sonya [name], DOB 04-07-1981, MRN [id number]  PCP:  Orson Eva, NP  Cardiologist:  Adrian Blackwater, MD      History of Present Illness: Sonya [name] is a 42 y.o. female who presents for  Chief Complaint  Patient presents with   Follow-up    Chest pain    Has chest pain last night and today  Chest Pain  This is a new problem. The current episode started today. The problem occurs 2 to 4 times per day. The problem has been waxing and waning. The pain is present in the substernal region. The pain is at a severity of 3/10. The pain is mild.      Past Medical History:  Diagnosis Date   Coronary artery disease    GERD (gastroesophageal reflux disease)    H/O heart artery stent    Hyperlipidemia    Hypertension    Tobacco abuse      Past Surgical History:  Procedure Laterality Date   CORONARY STENT INTERVENTION N/A 06/10/2018   Procedure: CORONARY STENT INTERVENTION;  Surgeon: Alwyn Pea, MD;  Location: [facility];  Service: Cardiovascular;  Laterality: N/A;   CORONARY STENT INTERVENTION N/A 04/14/2021   Procedure: CORONARY STENT INTERVENTION;  Surgeon: Marcina Millard, MD;  Location: [facility];  Service: Cardiovascular;  Laterality: N/A;   CORONARY STENT INTERVENTION N/A 01/20/2022   Procedure: CORONARY STENT INTERVENTION;  Surgeon: Marcina Millard, MD;  Location: [facility];  Service: Cardiovascular;  Laterality: N/A;   CORONARY/GRAFT ACUTE MI REVASCULARIZATION N/A 10/11/2021   Procedure: Coronary/Graft Acute MI Revascularization;  Surgeon: Yvonne Kendall, MD;  Location: [facility];  Service: Cardiovascular;  Laterality: N/A;   LEFT HEART CATH AND CORONARY ANGIOGRAPHY N/A 06/10/2018   Procedure: With coronary intervention and stenting;  Surgeon: Laurier Nancy, MD;  Location: [facility] CV [facility];  Service: Cardiovascular;  Laterality: N/A;   LEFT HEART  CATH AND CORONARY ANGIOGRAPHY Left 06/15/2020   Procedure: LEFT HEART CATH AND CORONARY ANGIOGRAPHY;  Surgeon: Laurier Nancy, MD;  Location: [facility];  Service: Cardiovascular;  Laterality: Left;   LEFT HEART CATH AND CORONARY ANGIOGRAPHY N/A 04/14/2021   Procedure: LEFT HEART CATH AND CORONARY ANGIOGRAPHY with intervention;  Surgeon: Laurier Nancy, MD;  Location: [facility];  Service: Cardiovascular;  Laterality: N/A;   LEFT HEART CATH AND CORONARY ANGIOGRAPHY N/A 10/11/2021   Procedure: LEFT HEART CATH AND CORONARY ANGIOGRAPHY;  Surgeon: Yvonne Kendall, MD;  Location: [facility];  Service: Cardiovascular;  Laterality: N/A;   LEFT HEART CATH AND CORONARY ANGIOGRAPHY N/A 01/20/2022   Procedure: LEFT HEART CATH AND CORONARY ANGIOGRAPHY;  Surgeon: Laurier Nancy, MD;  Location: [facility];  Service: Cardiovascular;  Laterality: N/A;   LEFT HEART CATH AND CORONARY ANGIOGRAPHY N/A 04/05/2022   Procedure: LEFT HEART CATH AND CORONARY ANGIOGRAPHY;  Surgeon: Corky Crafts, MD;  Location: [facility] INVASIVE CV LAB;  Service: Cardiovascular;  Laterality: N/A;   TUBAL LIGATION       Current Outpatient Medications  Medication Sig Dispense Refill   carvedilol (COREG) 25 MG tablet Take 1 tablet (25 mg total) by mouth 2 (two) times daily. 60 tablet 11   amLODipine (NORVASC) 5 MG tablet Take 1 tablet (5 mg total) by mouth daily. 30 tablet 11   aspirin (ASPIRIN CHILDRENS) 81 MG chewable tablet Chew 1 tablet (  81 mg total) by mouth daily. 30 tablet 0   atorvastatin (LIPITOR) 80 MG tablet Take 1 tablet (80 mg total) by mouth daily. 30 tablet 11   Bacillus Coagulans-Inulin (PROBIOTIC) 1-250 BILLION-MG CAPS Take 1 capsule by mouth daily. 30 capsule 0   busPIRone (BUSPAR) 10 MG tablet Take 10 mg by mouth 2 (two) times daily as needed.     carvedilol (COREG) 12.5 MG tablet TAKE 1 TABLET BY MOUTH TWICE A DAY 180 tablet 1   Cholecalciferol (VITAMIN D) 125 MCG (5000 UT)  CAPS Take 5,000 Units by mouth daily.     Cyclobenzaprine HCl (FLEXERIL PO) Take 5 mg by mouth. prn     Evolocumab 140 MG/ML SOAJ Inject 140 mg into the skin every 14 (fourteen) days. 2 mL 5   isosorbide mononitrate (IMDUR) 120 MG 24 hr tablet TAKE 1 TABLET BY MOUTH TWICE A DAY 180 tablet 1   nitroGLYCERIN (NITROSTAT) 0.4 MG SL tablet Place 1 tablet (0.4 mg total) under the tongue every 5 (five) minutes as needed for chest pain. 30 tablet 12   nystatin cream (MYCOSTATIN) Apply 1 application topically at bedtime as needed (irritation).     pantoprazole (PROTONIX) 40 MG tablet Take 40 mg by mouth 2 (two) times daily as needed (heartburn).     prasugrel (EFFIENT) 10 MG TABS tablet TAKE 1 TABLET BY MOUTH EVERY DAY 90 tablet 1   ranolazine (RANEXA) 1000 MG SR tablet TAKE 1 TABLET BY MOUTH TWICE A DAY 180 tablet 1   No current facility-administered medications for this visit.    Allergies:   Penicillins, Atorvastatin calcium, Metoprolol, Penicillins, Repatha [evolocumab], and Zetia [ezetimibe]    Social History:   reports that she quit smoking about 22 months ago. Her smoking use included cigarettes. She started smoking about 21 years ago. She has a 10 pack-year smoking history. She has never used smokeless tobacco. She reports current alcohol use. She reports that she does not use drugs.   Family History:  family history includes Heart attack in her father and mother.    ROS:     Review of Systems  Constitutional: Negative.   HENT: Negative.    Eyes: Negative.   Respiratory: Negative.    Cardiovascular:  Positive for chest pain.  Gastrointestinal: Negative.   Genitourinary: Negative.   Musculoskeletal: Negative.   Skin: Negative.   Neurological: Negative.   Endo/Heme/Allergies: Negative.   Psychiatric/Behavioral: Negative.    All other systems reviewed and are negative.     All other systems are reviewed and negative.    PHYSICAL EXAM: VS:  BP 125/89   Pulse 87   Ht 5\' 11"   (1.803 m)   Wt 282 lb (127.9 kg)   SpO2 98%   BMI 39.33 kg/m  , BMI Body mass index is 39.33 kg/m. Last weight:  Wt Readings from Last 3 Encounters:  02/08/23 282 lb (127.9 kg)  12/13/22 280 lb (127 kg)  12/06/22 280 lb (127 kg)     Physical Exam Constitutional:      Appearance: Normal appearance.  Cardiovascular:     Rate and Rhythm: Normal rate and regular rhythm.     Heart sounds: Normal heart sounds.  Pulmonary:     Effort: Pulmonary effort is normal.     Breath sounds: Normal breath sounds.  Musculoskeletal:     Right lower leg: No edema.     Left lower leg: No edema.  Neurological:     Mental Status: She is alert.  EKG: nsr non specific st changes, low voltage  Recent Labs: 08/17/2022: ALT 14; BUN 16; Creatinine, Ser 0.82; Hemoglobin 11.4; Platelets 299; Potassium 3.6; Sodium 139; TSH 0.670    Lipid Panel    Component Value Date/Time   CHOL 164 08/17/2022 1148   TRIG 65 08/17/2022 1148   HDL 60 08/17/2022 1148   CHOLHDL 5.0 04/05/2022 0938   VLDL 12 04/05/2022 0938   LDLCALC 91 08/17/2022 1148      Other studies Reviewed: Additional studies/ records that were reviewed today include:  Review of the above records demonstrates:      11/07/2022    3:20 PM  PAD Screen  Previous PAD dx? No  Previous surgical procedure? No  Pain with walking? No  Feet/toe relief with dangling? No  Painful, non-healing ulcers? No  Extremities discolored? No      ASSESSMENT AND PLAN:    ICD-10-CM   1. Coronary artery disease involving native coronary artery of native heart without angina pectoris  I25.10 MYOCARDIAL PERFUSION IMAGING    PCV ECHOCARDIOGRAM COMPLETE    2. Primary hypertension  I10 MYOCARDIAL PERFUSION IMAGING    PCV ECHOCARDIOGRAM COMPLETE    3. Unstable angina pectoris (HCC)  I20.0 MYOCARDIAL PERFUSION IMAGING    PCV ECHOCARDIOGRAM COMPLETE    4. Other chest pain  R07.89 MYOCARDIAL PERFUSION IMAGING    PCV ECHOCARDIOGRAM COMPLETE    advise echo, stress test       Problem List Items Addressed This Visit       Cardiovascular and Mediastinum   Coronary artery disease - Primary   Relevant Medications   carvedilol (COREG) 25 MG tablet   Other Relevant Orders   MYOCARDIAL PERFUSION IMAGING   PCV ECHOCARDIOGRAM COMPLETE   Unstable angina pectoris (HCC)   Relevant Medications   carvedilol (COREG) 25 MG tablet   Other Relevant Orders   MYOCARDIAL PERFUSION IMAGING   PCV ECHOCARDIOGRAM COMPLETE   HTN (hypertension)    stable      Relevant Medications   carvedilol (COREG) 25 MG tablet   Other Relevant Orders   MYOCARDIAL PERFUSION IMAGING   PCV ECHOCARDIOGRAM COMPLETE     Other   Chest pain   Relevant Orders   MYOCARDIAL PERFUSION IMAGING   PCV ECHOCARDIOGRAM COMPLETE   MYOCARDIAL PERFUSION IMAGING   PCV ECHOCARDIOGRAM COMPLETE       Disposition:   Return in about 1 week (around 02/15/2023) for echo, stress test and f/u, see if stress test can be done tomorrow.    Total time spent: 30 minutes  Signed,  Adrian Blackwater, MD  02/08/2023 11:45 AM    [facility]

## 2023-02-14 ENCOUNTER — Encounter: Payer: Self-pay | Admitting: Cardiovascular Disease

## 2023-02-19 ENCOUNTER — Other Ambulatory Visit: Payer: Medicaid Other

## 2023-02-23 IMAGING — CR DG CHEST 2V
1 series · 2 of 2 positions shown · non-contrast
Comparison: 11/06/2021

CLINICAL DATA: Chest pain

EXAM:
CHEST - 2 VIEW

[Series 1: dg chest 2 view · 0.14mm/px · 2 of 2 slices shown]
[im 1/2]
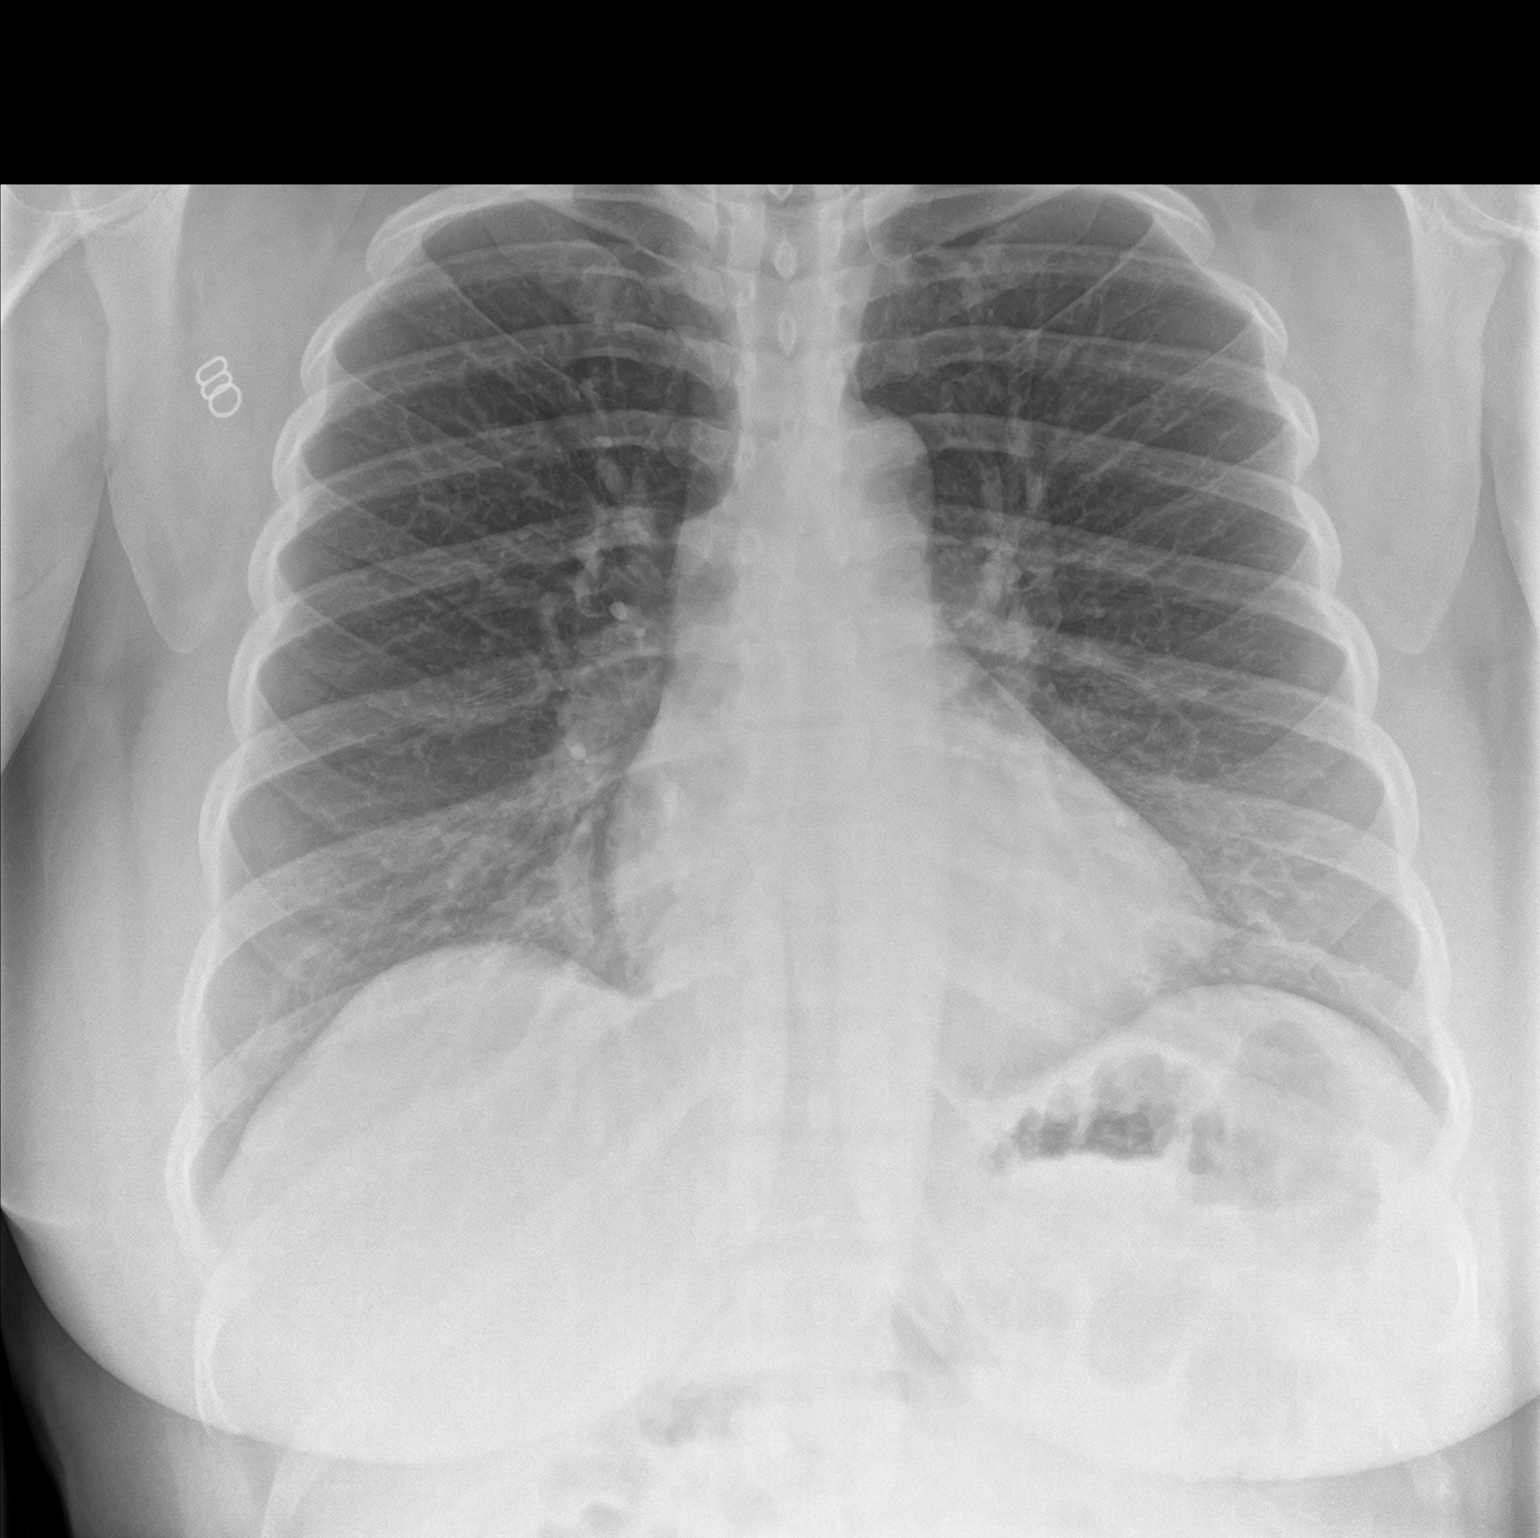
[im 2/2]
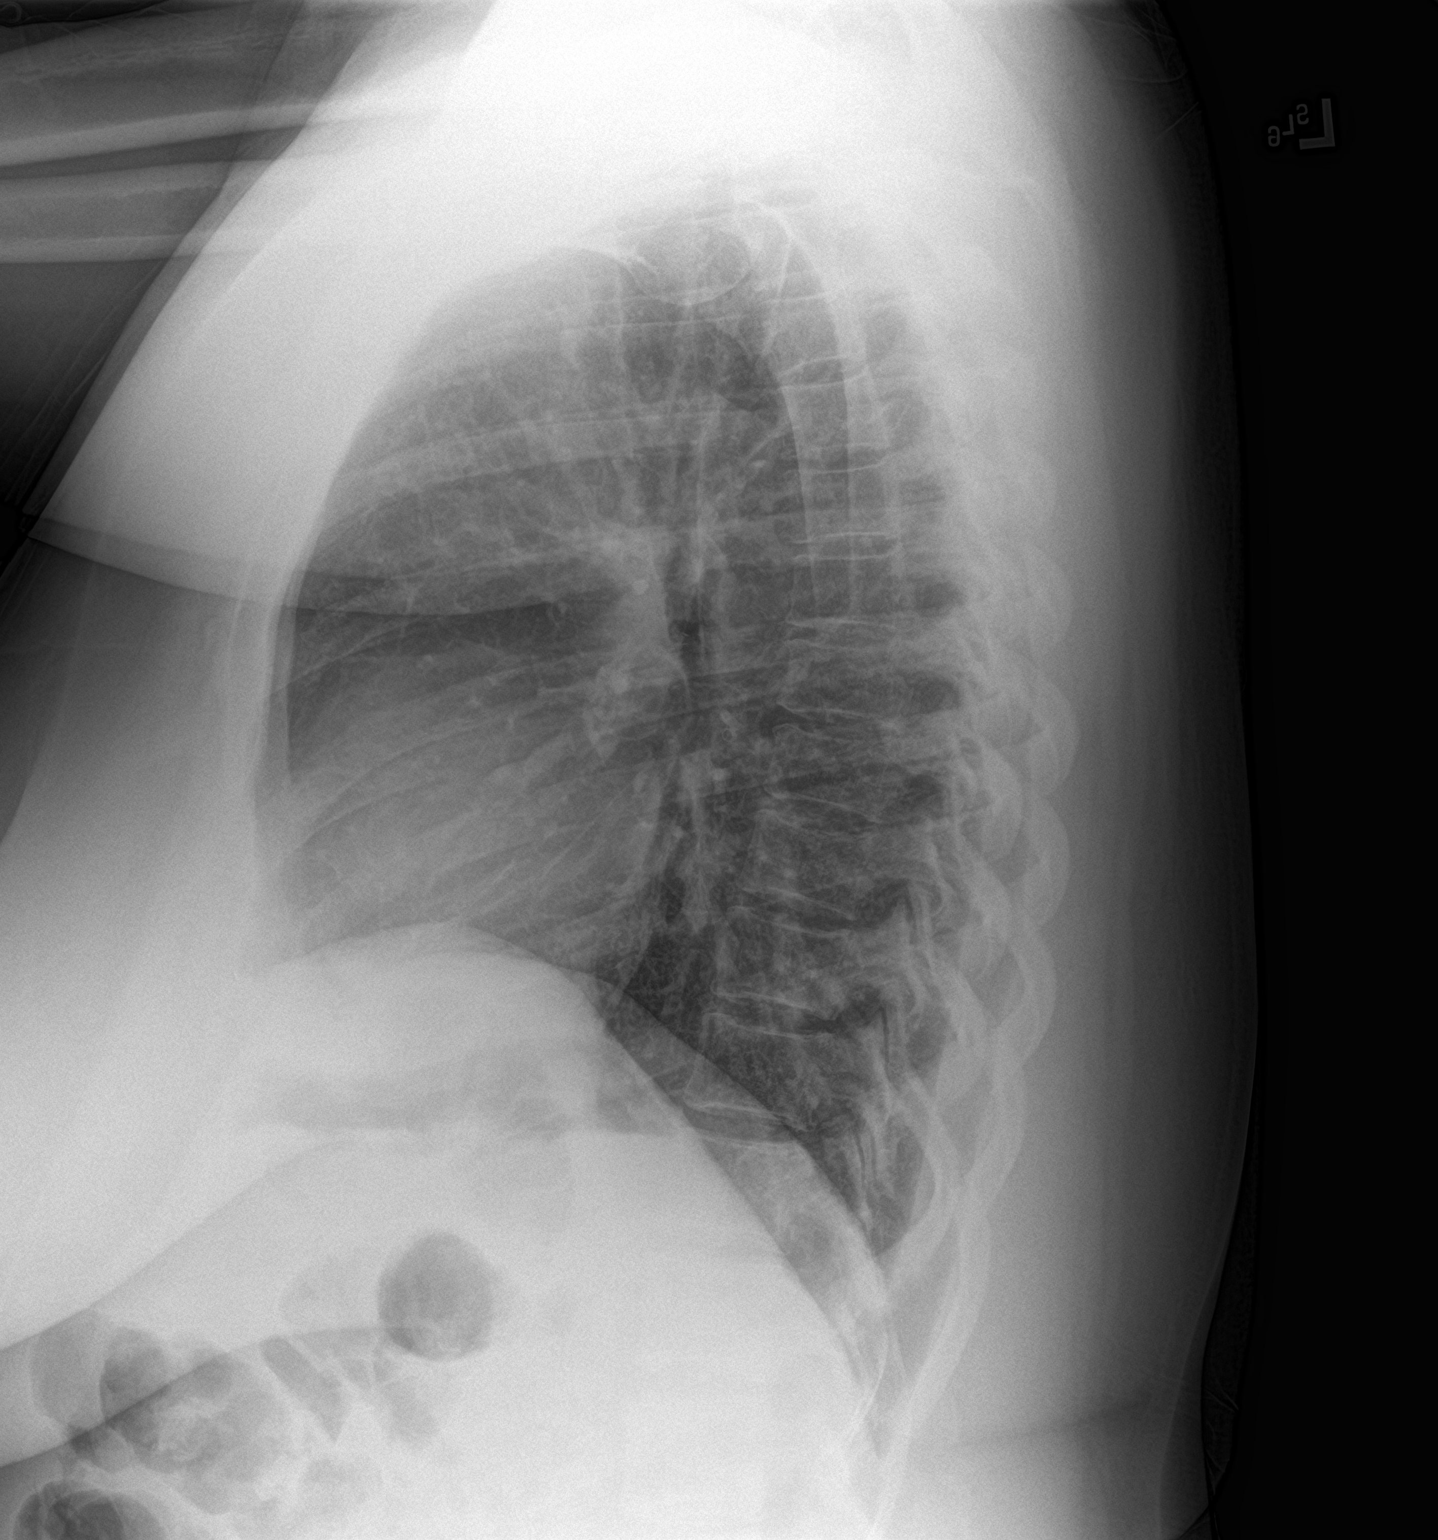

[2 of 2 positions shown; findings below may reference images not displayed]

FINDINGS: The heart size and mediastinal contours are within normal limits.
Both lungs are clear. The visualized skeletal structures are
unremarkable.
IMPRESSION: No acute abnormality of the lungs.

## 2023-03-05 ENCOUNTER — Other Ambulatory Visit: Payer: Self-pay | Admitting: Cardiovascular Disease

## 2023-03-05 ENCOUNTER — Telehealth: Payer: Self-pay | Admitting: Cardiovascular Disease

## 2023-03-05 NOTE — Telephone Encounter (Signed)
Patient called stating that her ranolazine (Ranexa) 1000mg  SR tablet was accidentally thrown away and she doesn't have any refills left on it. Patient worried that insurance may not cover since it would be to early for another refill, but she is completely out and needs a refill. Please adv

## 2023-03-06 ENCOUNTER — Other Ambulatory Visit: Payer: Self-pay

## 2023-03-06 ENCOUNTER — Other Ambulatory Visit: Payer: Self-pay | Admitting: Physician Assistant

## 2023-03-06 DIAGNOSIS — Z1231 Encounter for screening mammogram for malignant neoplasm of breast: Secondary | ICD-10-CM

## 2023-03-06 MED ORDER — RANOLAZINE ER 1000 MG PO TB12: 1000.0000 mg | ORAL_TABLET | Freq: Two times a day (BID) | ORAL | 1 refills | Status: DC

## 2023-03-06 NOTE — Telephone Encounter (Signed)
Rx sent 

## 2023-03-12 ENCOUNTER — Other Ambulatory Visit: Payer: Medicaid Other

## 2023-03-21 ENCOUNTER — Ambulatory Visit: Payer: Medicaid Other | Admitting: Cardiology

## 2023-04-10 ENCOUNTER — Other Ambulatory Visit: Payer: Self-pay

## 2023-04-10 ENCOUNTER — Ambulatory Visit: Payer: Medicaid Other

## 2023-04-10 ENCOUNTER — Emergency Department: Payer: Medicaid Other

## 2023-04-10 ENCOUNTER — Emergency Department
Admission: EM | Admit: 2023-04-10 | Discharge: 2023-04-10 | Disposition: A | Discharge status: Home / Self Care | Payer: Medicaid Other | Attending: Emergency Medicine | Admitting: Emergency Medicine

## 2023-04-10 DIAGNOSIS — E860 Dehydration: Secondary | ICD-10-CM | POA: Insufficient documentation

## 2023-04-10 DIAGNOSIS — R079 Chest pain, unspecified: Secondary | ICD-10-CM | POA: Insufficient documentation

## 2023-04-10 DIAGNOSIS — I10 Essential (primary) hypertension: Secondary | ICD-10-CM | POA: Diagnosis not present

## 2023-04-10 DIAGNOSIS — Z1152 Encounter for screening for COVID-19: Secondary | ICD-10-CM | POA: Diagnosis not present

## 2023-04-10 DIAGNOSIS — R52 Pain, unspecified: Secondary | ICD-10-CM

## 2023-04-10 DIAGNOSIS — M791 Myalgia, unspecified site: Secondary | ICD-10-CM | POA: Diagnosis not present

## 2023-04-10 DIAGNOSIS — R509 Fever, unspecified: Secondary | ICD-10-CM | POA: Diagnosis not present

## 2023-04-10 LAB — CBC
HCT: 36.9 % (ref 36.0–46.0)
Hemoglobin: 11.8 g/dL — ABNORMAL LOW (ref 12.0–15.0)
MCH: 24.1 pg — ABNORMAL LOW (ref 26.0–34.0)
MCHC: 32 g/dL (ref 30.0–36.0)
MCV: 75.3 fL — ABNORMAL LOW (ref 80.0–100.0)
Platelets: 298 10*3/uL (ref 150–400)
RBC: 4.9 MIL/uL (ref 3.87–5.11)
RDW: 17.2 % — ABNORMAL HIGH (ref 11.5–15.5)
WBC: 10.8 10*3/uL — ABNORMAL HIGH (ref 4.0–10.5)
nRBC: 0 % (ref 0.0–0.2)

## 2023-04-10 LAB — COMPREHENSIVE METABOLIC PANEL
ALT: 12 U/L (ref 0–44)
AST: 14 U/L — ABNORMAL LOW (ref 15–41)
Albumin: 3.7 g/dL (ref 3.5–5.0)
Alkaline Phosphatase: 67 U/L (ref 38–126)
Anion gap: 9 (ref 5–15)
BUN: 8 mg/dL (ref 6–20)
CO2: 20 mmol/L — ABNORMAL LOW (ref 22–32)
Calcium: 8.8 mg/dL — ABNORMAL LOW (ref 8.9–10.3)
Chloride: 104 mmol/L (ref 98–111)
Creatinine, Ser: 0.77 mg/dL (ref 0.44–1.00)
GFR, Estimated: >60 mL/min (ref >60)
Glucose, Bld: 104 mg/dL — ABNORMAL HIGH (ref 70–99)
Potassium: 3.4 mmol/L — ABNORMAL LOW (ref 3.5–5.1)
Sodium: 133 mmol/L — ABNORMAL LOW (ref 135–145)
Total Bilirubin: 0.6 mg/dL (ref 0.3–1.2)
Total Protein: 8 g/dL (ref 6.5–8.1)

## 2023-04-10 LAB — RESP PANEL BY RT-PCR (RSV, FLU A&B, COVID)  RVPGX2
Influenza A by PCR: NEGATIVE
Influenza B by PCR: NEGATIVE
Resp Syncytial Virus by PCR: NEGATIVE
SARS Coronavirus 2 by RT PCR: NEGATIVE

## 2023-04-10 LAB — TROPONIN I (HIGH SENSITIVITY)
Troponin I (High Sensitivity): 3 ng/L (ref <18)
Troponin I (High Sensitivity): 3 ng/L (ref <18)

## 2023-04-10 MED ORDER — ACETAMINOPHEN 500 MG PO TABS
1000.0000 mg | ORAL_TABLET | Freq: Once | ORAL | Status: AC
  Administered 2023-04-10: 1000 mg via ORAL
  Filled 2023-04-10: qty 2

## 2023-04-10 NOTE — Discharge Instructions (Signed)
Please follow-up with your primary care provider in the next couple days for reassessment.  Please return emergency department for any worsening symptoms.

## 2023-04-10 NOTE — ED Provider Notes (Signed)
[facility] Provider Note    Event Date/Time   First MD Initiated Contact with Patient 04/10/23 1920     (approximate)   History   Chest Pain   HPI Sonya [name] is a 42 y.o. female with history of STEMI s/p PCI x 4, HTN, HLD presenting today for body aches.  Patient states 4 days ago she started having headaches and generalized bodyaches.  Started having pain going down the left side of her neck and into the left side of her chest and left arm. Does not have any specific shortness of breath, nausea, diaphoresis, vomiting, abdominal pain.  No leg pain or leg swelling.  No obvious sick contacts.  Chart reviewed with prior admissions for STEMI requiring PCI.     Physical Exam   Triage Vital Signs: ED Triage Vitals  Encounter Vitals Group     BP 04/10/23 1915 124/89     Systolic BP Percentile --      Diastolic BP Percentile --      Pulse Rate 04/10/23 1915 90     Resp 04/10/23 1915 20     Temp 04/10/23 1915 (!) 100.7 F (38.2 C)     Temp Source 04/10/23 1915 Oral     SpO2 04/10/23 1915 94 %     Weight 04/10/23 1913 265 lb (120.2 kg)     Height 04/10/23 1913 5\' 11"  (1.803 m)     Head Circumference --      Peak Flow --      Pain Score 04/10/23 1913 8     Pain Loc --      Pain Education --      Exclude from Growth Chart --     Most recent vital signs: Vitals:   04/10/23 2000 04/10/23 2139  BP: 109/69   Pulse: 78   Resp:    Temp:  98.7 F (37.1 C)  SpO2:     Physical Exam: I have reviewed the vital signs and nursing notes. General: Awake, alert, no acute distress.  Nontoxic appearing. Head:  Atraumatic, normocephalic.   ENT:  EOM intact, PERRL. Oral mucosa is pink and moist with no lesions. Neck: Neck is supple with full range of motion, No meningeal signs. Cardiovascular:  RRR, No murmurs. Peripheral pulses palpable and equal bilaterally. Respiratory:  Symmetrical chest wall expansion.  No rhonchi, rales, or wheezes.  Good air  movement throughout.  No use of accessory muscles.   Musculoskeletal:  No cyanosis or edema. Moving extremities with full ROM Abdomen:  Soft, nontender, nondistended. Neuro:  GCS 15, moving all four extremities, interacting appropriately. Speech clear. Psych:  Calm, appropriate.   Skin:  Warm, dry, no rash.    ED Results / Procedures / Treatments   Labs (all labs ordered are listed, but only abnormal results are displayed) Labs Reviewed  CBC - Abnormal; Notable for the following components:      Result Value   WBC 10.8 (*)    Hemoglobin 11.8 (*)    MCV 75.3 (*)    MCH 24.1 (*)    RDW 17.2 (*)    All other components within normal limits  COMPREHENSIVE METABOLIC PANEL - Abnormal; Notable for the following components:   Sodium 133 (*)    Potassium 3.4 (*)    CO2 20 (*)    Glucose, Bld 104 (*)    Calcium 8.8 (*)    AST 14 (*)    All other components within normal limits  RESP  PANEL BY RT-PCR (RSV, FLU A&B, COVID)  RVPGX2  POC URINE PREG, ED  TROPONIN I (HIGH SENSITIVITY)  TROPONIN I (HIGH SENSITIVITY)     EKG My EKG interpretation: Rate of 92, normal sinus rhythm, normal axis, normal intervals.  No acute ST elevations or depressions.   RADIOLOGY Independently interpreted chest x-ray with no acute findings   PROCEDURES:  Critical Care performed: No  Procedures   MEDICATIONS ORDERED IN ED: Medications  acetaminophen (TYLENOL) tablet 1,000 mg (1,000 mg Oral Given 04/10/23 1917)     IMPRESSION / MDM / ASSESSMENT AND PLAN / ED COURSE  I reviewed the triage vital signs and the nursing notes.                              Differential diagnosis includes, but is not limited to, viral URI, pneumonia, less likely reflux, viral gastroenteritis.  Patient's presentation is most consistent with acute complicated illness / injury requiring diagnostic workup.  Patient is a well-appearing 42 year old female presenting today for headache and bodyaches over the past several  days.  Physical exam is largely unremarkable and vital signs stable other than initial fever at 100.7.  Symptoms seemed most consistent with viral upper respiratory infection given congestion, headache, and bodyaches symptoms.  Does note some chest pain symptoms but is unsure if this is just related to the bodyaches.  Given her significant cardiac history, will evaluate further for potential ACS.  EKG unremarkable without ischemic changes.  Troponins were negative x 2.  CBC with only mild leukocytosis but otherwise unremarkable and CMP largely reassuring.  Patient was negative for COVID, flu, and RSV.  Chest x-ray was negative for acute pathology.  Reassessed patient who states no active pain symptoms at this time and feeling well.  Vital signs stable with no fever after giving Tylenol.  Still believe most likely this is a viral URI.  Patient agrees with this as well and states that she does not want any further workup at this time.  She declined any additional CT imaging for further evaluation and wanted to follow-up with her primary care provider.  Patient was given strict return precautions for any worsening symptoms.  The patient is on the cardiac monitor to evaluate for evidence of arrhythmia and/or significant heart rate changes. Clinical Course as of 04/11/23 0010  Tue Apr 10, 2023  2007 Troponin I (High Sensitivity): 3 [DW]  2007 Comprehensive metabolic panel(!) Largely unremarkable aside from possible slight dehydration [DW]  2146 DG Chest 1 View No acute abnormalities [DW]  2213 Troponin I (High Sensitivity): 3 [DW]    Clinical Course User Index [DW] Janith Lima, MD     FINAL CLINICAL IMPRESSION(S) / ED DIAGNOSES   Final diagnoses:  Generalized body aches  Fever, unspecified fever cause     Rx / DC Orders   ED Discharge Orders     None        Note:  This document was prepared using Dragon voice recognition software and may include unintentional dictation errors.    Janith Lima, MD 04/11/23 [phone]

## 2023-04-10 NOTE — ED Triage Notes (Signed)
Pt presents to ER with c/o chest pain x4 days, but has been worsening over last 2 days.  Pt reports pain has been constant, but has periods where it hurts more than others.  Reports pain is in middle of chest and radiates to left shoulder and left side of neck.  Pt reports MI in 2019, and has 4 stents, with most recent stent placed in April 2023.  Pt endorses some associated SOB, weakness and chills with her chest pain.  Pt is otherwise A&O x4 and in NAD at this time.

## 2023-04-18 ENCOUNTER — Ambulatory Visit: Payer: Medicaid Other

## 2023-05-31 ENCOUNTER — Other Ambulatory Visit: Payer: Self-pay | Admitting: Cardiology

## 2023-06-01 ENCOUNTER — Other Ambulatory Visit: Payer: Self-pay

## 2023-06-04 MED ORDER — FLUCONAZOLE 150 MG PO TABS: 150.0000 mg | ORAL_TABLET | Freq: Once | ORAL | 0 refills | Status: AC

## 2023-06-07 ENCOUNTER — Ambulatory Visit
Admission: RE | Admit: 2023-06-07 | Discharge: 2023-06-07 | Disposition: A | Discharge status: Home / Self Care | Payer: Medicaid Other | Source: Ambulatory Visit | Attending: Physician Assistant | Admitting: Physician Assistant

## 2023-06-07 DIAGNOSIS — Z1231 Encounter for screening mammogram for malignant neoplasm of breast: Secondary | ICD-10-CM | POA: Diagnosis present

## 2023-06-13 ENCOUNTER — Ambulatory Visit: Payer: PRIVATE HEALTH INSURANCE | Admitting: Cardiology

## 2023-06-13 ENCOUNTER — Encounter: Payer: Self-pay | Admitting: Cardiology

## 2023-06-13 VITALS — BP 139/82 | HR 70 | Ht 71.0 in | Wt 280.0 lb

## 2023-06-13 DIAGNOSIS — I10 Essential (primary) hypertension: Secondary | ICD-10-CM

## 2023-06-13 DIAGNOSIS — I2510 Atherosclerotic heart disease of native coronary artery without angina pectoris: Secondary | ICD-10-CM

## 2023-06-13 DIAGNOSIS — R600 Localized edema: Secondary | ICD-10-CM

## 2023-06-13 DIAGNOSIS — E782 Mixed hyperlipidemia: Secondary | ICD-10-CM | POA: Diagnosis not present

## 2023-06-13 NOTE — Assessment & Plan Note (Signed)
Controlled, continue same medications

## 2023-06-13 NOTE — Progress Notes (Signed)
Cardiology Office Note   Date:  06/13/2023   ID:  ALIVEA ARCHULETA, DOB [birth year], MRN [id number]  PCP:  Orson Eva, NP  Cardiologist:  Marisue Ivan, NP      History of Present Illness: Sonya [name] is a 42 y.o. female who presents for  Chief Complaint  Patient presents with   Follow-up    Swelling  Started working 12 hr shift and has noticed swelling in her legs and feet    Patient in office for routine cardiac exam. Denies shortness of breath, dizziness. Patient continues to have occasional chest pain, lower extremity edema. Stress test and echo not done as ordered in August 2024 due to insurance not approving.       Past Medical History:  Diagnosis Date   Coronary artery disease    GERD (gastroesophageal reflux disease)    H/O heart artery stent    Hyperlipidemia    Hypertension    Tobacco abuse      Past Surgical History:  Procedure Laterality Date   CORONARY STENT INTERVENTION N/A 06/10/2018   Procedure: CORONARY STENT INTERVENTION;  Surgeon: Alwyn Pea, MD;  Location: [facility] INVASIVE CV [facility];  Service: Cardiovascular;  Laterality: N/A;   CORONARY STENT INTERVENTION N/A 04/14/2021   Procedure: CORONARY STENT INTERVENTION;  Surgeon: Marcina Millard, MD;  Location: [facility] IN[facility];  Service: Cardiovascular;  Laterality: N/A;   CORONARY STENT INTERVENTION N/A 01/20/2022   Procedure: CORONARY STENT INTERVENTION;  Surgeon: Marcina Millard, MD;  Location: [facility] INV[facility];  Service: Cardiovascular;  Laterality: N/A;   CORONARY/GRAFT ACUTE MI REVASCULARIZATION N/A 10/11/2021   Procedure: Coronary/Graft Acute MI Revascularization;  Surgeon: Yvonne Kendall, MD;  Location: [facility] IN[facility] CV [facility];  Service: Cardiovascular;  Laterality: N/A;   LEFT HEART CATH AND CORONARY ANGIOGRAPHY N/A 06/10/2018   Procedure: With coronary intervention and stenting;  Surgeon: Laurier Nancy, MD;  Location: [facility] INVAS[facility] CV LAB;  Service: Cardiovascular;   Laterality: N/A;   LEFT HEART CATH AND CORONARY ANGIOGRAPHY Left 06/15/2020   Procedure: LEFT HEART CATH AND CORONARY ANGIOGRAPHY;  Surgeon: Laurier Nancy, MD;  Location: [facility] INV[facility] CV [facility];  Service: Cardiovascular;  Laterality: Left;   LEFT HEART CATH AND CORONARY ANGIOGRAPHY N/A 04/14/2021   Procedure: LEFT HEART CATH AND CORONARY ANGIOGRAPHY with intervention;  Surgeon: Laurier Nancy, MD;  Location: [facility];  Service: Cardiovascular;  Laterality: N/A;   LEFT HEART CATH AND CORONARY ANGIOGRAPHY N/A 10/11/2021   Procedure: LEFT HEART CATH AND CORONARY ANGIOGRAPHY;  Surgeon: Yvonne Kendall, MD;  Location: [facility] INV[facility];  Service: Cardiovascular;  Laterality: N/A;   LEFT HEART CATH AND CORONARY ANGIOGRAPHY N/A 01/20/2022   Procedure: LEFT HEART CATH AND CORONARY ANGIOGRAPHY;  Surgeon: Laurier Nancy, MD;  Location: [facility] INV[facility];  Service: Cardiovascular;  Laterality: N/A;   LEFT HEART CATH AND CORONARY ANGIOGRAPHY N/A 04/05/2022   Procedure: LEFT HEART CATH AND CORONARY ANGIOGRAPHY;  Surgeon: Corky Crafts, MD;  Location: [facility] INVASIVE CV LAB;  Service: Cardiovascular;  Laterality: N/A;   TUBAL LIGATION       Current Outpatient Medications  Medication Sig Dispense Refill   amLODipine (NORVASC) 5 MG tablet Take 1 tablet (5 mg total) by mouth daily. 30 tablet 11   aspirin (ASPIRIN CHILDRENS) 81 MG chewable tablet Chew 1 tablet (81 mg total) by mouth daily. 30 tablet 0   atorvastatin (LIPITOR) 80 MG tablet Take 1 tablet (80 mg total) by mouth daily. 30  tablet 11   Bacillus Coagulans-Inulin (PROBIOTIC) 1-250 BILLION-MG CAPS Take 1 capsule by mouth daily. 30 capsule 0   busPIRone (BUSPAR) 10 MG tablet Take 10 mg by mouth 2 (two) times daily as needed.     carvedilol (COREG) 12.5 MG tablet TAKE 1 TABLET BY MOUTH TWICE A DAY 180 tablet 1   carvedilol (COREG) 25 MG tablet Take 1 tablet (25 mg total) by mouth 2 (two) times daily. 60 tablet 11   Cholecalciferol  (VITAMIN D) 125 MCG (5000 UT) CAPS Take 5,000 Units by mouth daily.     Cyclobenzaprine HCl (FLEXERIL PO) Take 5 mg by mouth. prn     Evolocumab 140 MG/ML SOAJ Inject 140 mg into the skin every 14 (fourteen) days. 2 mL 5   isosorbide mononitrate (IMDUR) 120 MG 24 hr tablet TAKE 1 TABLET BY MOUTH TWICE A DAY 180 tablet 1   nitroGLYCERIN (NITROSTAT) 0.4 MG SL tablet Place 1 tablet (0.4 mg total) under the tongue every 5 (five) minutes as needed for chest pain. 30 tablet 12   nystatin cream (MYCOSTATIN) Apply 1 application topically at bedtime as needed (irritation).     pantoprazole (PROTONIX) 40 MG tablet TAKE 1 TABLET BY MOUTH TWICE A DAY 180 tablet 1   prasugrel (EFFIENT) 10 MG TABS tablet TAKE 1 TABLET BY MOUTH EVERY DAY 90 tablet 1   ranolazine (RANEXA) 1000 MG SR tablet Take 1 tablet (1,000 mg total) by mouth 2 (two) times daily. 180 tablet 1   No current facility-administered medications for this visit.    Allergies:   Penicillins, Atorvastatin calcium, Metoprolol, Penicillins, Repatha [evolocumab], and Zetia [ezetimibe]    Social History:   reports that she quit smoking about 2 years ago. Her smoking use included cigarettes. She started smoking about 22 years ago. She has a 10 pack-year smoking history. She has never used smokeless tobacco. She reports current alcohol use. She reports that she does not use drugs.   Family History:  family history includes Heart attack in her father and mother.    ROS:     Review of Systems  Constitutional: Negative.   HENT: Negative.    Eyes: Negative.   Respiratory: Negative.    Cardiovascular:  Positive for chest pain and leg swelling.  Gastrointestinal: Negative.   Genitourinary: Negative.   Musculoskeletal: Negative.   Skin: Negative.   Neurological: Negative.   Endo/Heme/Allergies: Negative.   Psychiatric/Behavioral: Negative.    All other systems reviewed and are negative.     All other systems are reviewed and negative.     PHYSICAL EXAM: VS:  BP 139/82   Pulse 70   Ht 5\' 11"  (1.803 m)   Wt 280 lb (127 kg)   LMP 05/16/2023   SpO2 99%   BMI 39.05 kg/m  , BMI Body mass index is 39.05 kg/m. Last weight:  Wt Readings from Last 3 Encounters:  06/13/23 280 lb (127 kg)  04/10/23 265 lb (120.2 kg)  02/08/23 282 lb (127.9 kg)     Physical Exam Vitals and nursing note reviewed.  Constitutional:      Appearance: Normal appearance. She is normal weight.  HENT:     Head: Normocephalic and atraumatic.     Nose: Nose normal.     Mouth/Throat:     Mouth: Mucous membranes are moist.     Pharynx: Oropharynx is clear.  Eyes:     Conjunctiva/sclera: Conjunctivae normal.     Pupils: Pupils are equal, round, and reactive to light.  Cardiovascular:     Rate and Rhythm: Normal rate and regular rhythm.     Pulses: Normal pulses.     Heart sounds: Normal heart sounds.  Pulmonary:     Effort: Pulmonary effort is normal.     Breath sounds: Normal breath sounds.  Abdominal:     General: Abdomen is flat. Bowel sounds are normal.     Palpations: Abdomen is soft.  Musculoskeletal:        General: Normal range of motion.     Cervical back: Normal range of motion.  Skin:    General: Skin is warm and dry.  Neurological:     General: No focal deficit present.     Mental Status: She is alert and oriented to person, place, and time. Mental status is at baseline.  Psychiatric:        Mood and Affect: Mood normal.        Behavior: Behavior normal.      EKG: none  Recent Labs: 08/17/2022: TSH 0.670 04/10/2023: ALT 12; BUN 8; Creatinine, Ser 0.77; Hemoglobin 11.8; Platelets 298; Potassium 3.4; Sodium 133    Lipid Panel    Component Value Date/Time   CHOL 164 08/17/2022 1148   TRIG 65 08/17/2022 1148   HDL 60 08/17/2022 1148   CHOLHDL 5.0 04/05/2022 0938   VLDL 12 04/05/2022 0938   LDLCALC 91 08/17/2022 1148      Other studies Reviewed: Additional studies/ records that were reviewed today include:   Review of the above records demonstrates:      11/07/2022    3:20 PM  PAD Screen  Previous PAD dx? No  Previous surgical procedure? No  Pain with walking? No  Feet/toe relief with dangling? No  Painful, non-healing ulcers? No  Extremities discolored? No      ASSESSMENT AND PLAN:    ICD-10-CM   1. Coronary artery disease involving native coronary artery of native heart without angina pectoris  I25.10     2. Primary hypertension  I10     3. Mixed hyperlipidemia  E78.2     4. Bilateral lower extremity edema  R60.0        Problem List Items Addressed This Visit       Cardiovascular and Mediastinum   Coronary artery disease - Primary   Occasional chest pain. Insurance would not approved stress test. New insurance starting in January. Will get approval at that time.       HTN (hypertension)   Controlled, continue same medications.         Other   HLD (hyperlipidemia)   Bilateral lower extremity edema   Edema when working on her feet for 12 hours, wearing compression hose. Elevate when possible.         Disposition:   Return in about 4 weeks (around 07/11/2023).    Total time spent: 25 minutes  Signed,  [employer], NP  06/13/2023 9:30 AM    [facility]

## 2023-06-13 NOTE — Assessment & Plan Note (Signed)
Edema when working on her feet for 12 hours, wearing compression hose. Elevate when possible.

## 2023-06-13 NOTE — Assessment & Plan Note (Signed)
Occasional chest pain. Insurance would not approved stress test. New insurance starting in January. Will get approval at that time.

## 2023-06-27 ENCOUNTER — Emergency Department: Payer: Medicaid Other

## 2023-06-27 ENCOUNTER — Other Ambulatory Visit: Payer: Self-pay

## 2023-06-27 ENCOUNTER — Encounter: Payer: Self-pay | Admitting: *Deleted

## 2023-06-27 ENCOUNTER — Emergency Department
Admission: EM | Admit: 2023-06-27 | Discharge: 2023-06-27 | Discharge status: Against Medical Advice | Payer: Medicaid Other | Attending: Emergency Medicine | Admitting: Emergency Medicine

## 2023-06-27 DIAGNOSIS — R079 Chest pain, unspecified: Secondary | ICD-10-CM | POA: Diagnosis present

## 2023-06-27 DIAGNOSIS — Z5321 Procedure and treatment not carried out due to patient leaving prior to being seen by health care provider: Secondary | ICD-10-CM | POA: Insufficient documentation

## 2023-06-27 LAB — BASIC METABOLIC PANEL
Anion gap: 9 (ref 5–15)
BUN: 11 mg/dL (ref 6–20)
CO2: 23 mmol/L (ref 22–32)
Calcium: 8.7 mg/dL — ABNORMAL LOW (ref 8.9–10.3)
Chloride: 105 mmol/L (ref 98–111)
Creatinine, Ser: 0.97 mg/dL (ref 0.44–1.00)
GFR, Estimated: >60 mL/min (ref >60)
Glucose, Bld: 97 mg/dL (ref 70–99)
Potassium: 3.5 mmol/L (ref 3.5–5.1)
Sodium: 137 mmol/L (ref 135–145)

## 2023-06-27 LAB — CBC
HCT: 34.5 % — ABNORMAL LOW (ref 36.0–46.0)
Hemoglobin: 11.1 g/dL — ABNORMAL LOW (ref 12.0–15.0)
MCH: 24.8 pg — ABNORMAL LOW (ref 26.0–34.0)
MCHC: 32.2 g/dL (ref 30.0–36.0)
MCV: 77 fL — ABNORMAL LOW (ref 80.0–100.0)
Platelets: 266 10*3/uL (ref 150–400)
RBC: 4.48 MIL/uL (ref 3.87–5.11)
RDW: 19.4 % — ABNORMAL HIGH (ref 11.5–15.5)
WBC: 6.7 10*3/uL (ref 4.0–10.5)
nRBC: 0 % (ref 0.0–0.2)

## 2023-06-27 LAB — TROPONIN I (HIGH SENSITIVITY): Troponin I (High Sensitivity): 3 ng/L (ref <18)

## 2023-06-27 LAB — PROTIME-INR
INR: 1 (ref 0.8–1.2)
Prothrombin Time: 13.8 s (ref 11.4–15.2)

## 2023-06-27 NOTE — ED Triage Notes (Signed)
 Pt ambulatory to triage.  Pt reports chest pain since 1830.  No sob.  Denies n/v   pt reports pain beginning while at work tonight.  Pt took 1 ntg without relief.  Pt alert.  Speech clear

## 2023-06-28 ENCOUNTER — Ambulatory Visit (INDEPENDENT_AMBULATORY_CARE_PROVIDER_SITE_OTHER): Payer: PRIVATE HEALTH INSURANCE | Admitting: Cardiovascular Disease

## 2023-06-28 ENCOUNTER — Encounter: Payer: Self-pay | Admitting: Cardiovascular Disease

## 2023-06-28 VITALS — BP 131/82 | HR 70 | Resp 98 | Ht 71.0 in | Wt 276.4 lb

## 2023-06-28 DIAGNOSIS — E782 Mixed hyperlipidemia: Secondary | ICD-10-CM

## 2023-06-28 DIAGNOSIS — I10 Essential (primary) hypertension: Secondary | ICD-10-CM | POA: Diagnosis not present

## 2023-06-28 DIAGNOSIS — R0789 Other chest pain: Secondary | ICD-10-CM | POA: Diagnosis not present

## 2023-06-28 DIAGNOSIS — R0602 Shortness of breath: Secondary | ICD-10-CM | POA: Diagnosis not present

## 2023-06-28 DIAGNOSIS — Z720 Tobacco use: Secondary | ICD-10-CM

## 2023-06-28 DIAGNOSIS — I2510 Atherosclerotic heart disease of native coronary artery without angina pectoris: Secondary | ICD-10-CM

## 2023-06-28 DIAGNOSIS — I200 Unstable angina: Secondary | ICD-10-CM

## 2023-06-28 NOTE — Progress Notes (Signed)
 Cardiology Office Note   Date:  06/28/2023   ID:  Sonya [name], DOB 1981/01/09, MRN [id number]  PCP:  Glennon Sand, NP  Cardiologist:  Denyse Bathe, MD      History of Present Illness: Sonya [name] is a 43 y.o. female who presents for  Chief Complaint  Patient presents with   Hospitalization Follow-up    Hospital follow up, chest pains.   Follow-up    Flu shot was given Monday Swelling in feet and legs works 12 hours a day on her feet    Has chest pain and SOB and went to ER last night .Where cxr and blood work was autonation with troponin 3. Sent for stress test.  Chest Pain  This is a new problem. The current episode started today. The problem occurs 2 to 4 times per day. The pain is present in the epigastric region and substernal region. The pain is at a severity of 6/10. The pain is moderate. The quality of the pain is described as heavy. The pain radiates to the left shoulder.      Past Medical History:  Diagnosis Date   Coronary artery disease    GERD (gastroesophageal reflux disease)    H/O heart artery stent    Hyperlipidemia    Hypertension    Tobacco abuse      Past Surgical History:  Procedure Laterality Date   CORONARY STENT INTERVENTION N/A 06/10/2018   Procedure: CORONARY STENT INTERVENTION;  Surgeon: Florencio Cara BIRCH, MD;  Location: [facility];  Service: Cardiovascular;  Laterality: N/A;   CORONARY STENT INTERVENTION N/A 04/14/2021   Procedure: CORONARY STENT INTERVENTION;  Surgeon: Ammon Blunt, MD;  Location: [facility];  Service: Cardiovascular;  Laterality: N/A;   CORONARY STENT INTERVENTION N/A 01/20/2022   Procedure: CORONARY STENT INTERVENTION;  Surgeon: Ammon Blunt, MD;  Location: [facility];  Service: Cardiovascular;  Laterality: N/A;   CORONARY/GRAFT ACUTE MI REVASCULARIZATION N/A 10/11/2021   Procedure: Coronary/Graft Acute MI Revascularization;  Surgeon: Mady Bruckner, MD;   Location: [facility];  Service: Cardiovascular;  Laterality: N/A;   LEFT HEART CATH AND CORONARY ANGIOGRAPHY N/A 06/10/2018   Procedure: With coronary intervention and stenting;  Surgeon: Bathe Denyse LABOR, MD;  Location: [facility];  Service: Cardiovascular;  Laterality: N/A;   LEFT HEART CATH AND CORONARY ANGIOGRAPHY Left 06/15/2020   Procedure: LEFT HEART CATH AND CORONARY ANGIOGRAPHY;  Surgeon: Bathe Denyse LABOR, MD;  Location: [facility];  Service: Cardiovascular;  Laterality: Left;   LEFT HEART CATH AND CORONARY ANGIOGRAPHY N/A 04/14/2021   Procedure: LEFT HEART CATH AND CORONARY ANGIOGRAPHY with intervention;  Surgeon: Bathe Denyse LABOR, MD;  Location: [facility];  Service: Cardiovascular;  Laterality: N/A;   LEFT HEART CATH AND CORONARY ANGIOGRAPHY N/A 10/11/2021   Procedure: LEFT HEART CATH AND CORONARY ANGIOGRAPHY;  Surgeon: Mady Bruckner, MD;  Location: [facility];  Service: Cardiovascular;  Laterality: N/A;   LEFT HEART CATH AND CORONARY ANGIOGRAPHY N/A 01/20/2022   Procedure: LEFT HEART CATH AND CORONARY ANGIOGRAPHY;  Surgeon: Bathe Denyse LABOR, MD;  Location: [facility];  Service: Cardiovascular;  Laterality: N/A;   LEFT HEART CATH AND CORONARY ANGIOGRAPHY N/A 04/05/2022   Procedure: LEFT HEART CATH AND CORONARY ANGIOGRAPHY;  Surgeon: Dann Candyce RAMAN, MD;  Location: [facility] INVASIVE CV [facility];  Service: Cardiovascular;  Laterality: N/A;   TUBAL LIGATION       Current Outpatient Medications  Medication Sig Dispense Refill   amLODipine  (NORVASC ) 5 MG tablet Take 1 tablet (5 mg total) by mouth daily. 30 tablet 11   aspirin  (ASPIRIN  CHILDRENS) 81 MG chewable tablet Chew 1 tablet (81 mg total) by mouth daily. 30 tablet 0   atorvastatin  (LIPITOR ) 80 MG tablet Take 1 tablet (80 mg total) by mouth daily. 30 tablet 11   Bacillus Coagulans-Inulin (PROBIOTIC) 1-250 BILLION-MG CAPS Take 1 capsule by mouth daily. 30 capsule 0   busPIRone   (BUSPAR ) 10 MG tablet Take 10 mg by mouth 2 (two) times daily as needed.     carvedilol  (COREG ) 12.5 MG tablet TAKE 1 TABLET BY MOUTH TWICE A DAY 180 tablet 1   carvedilol  (COREG ) 25 MG tablet Take 1 tablet (25 mg total) by mouth 2 (two) times daily. 60 tablet 11   Cholecalciferol  (VITAMIN D ) 125 MCG (5000 UT) CAPS Take 5,000 Units by mouth daily.     Cyclobenzaprine HCl (FLEXERIL PO) Take 5 mg by mouth. prn     Evolocumab  140 MG/ML SOAJ Inject 140 mg into the skin every 14 (fourteen) days. 2 mL 5   isosorbide  mononitrate (IMDUR ) 120 MG 24 hr tablet TAKE 1 TABLET BY MOUTH TWICE A DAY 180 tablet 1   nitroGLYCERIN  (NITROSTAT ) 0.4 MG SL tablet Place 1 tablet (0.4 mg total) under the tongue every 5 (five) minutes as needed for chest pain. 30 tablet 12   nystatin cream (MYCOSTATIN) Apply 1 application topically at bedtime as needed (irritation).     pantoprazole  (PROTONIX ) 40 MG tablet TAKE 1 TABLET BY MOUTH TWICE A DAY 180 tablet 1   prasugrel  (EFFIENT ) 10 MG TABS tablet TAKE 1 TABLET BY MOUTH EVERY DAY 90 tablet 1   ranolazine  (RANEXA ) 1000 MG SR tablet Take 1 tablet (1,000 mg total) by mouth 2 (two) times daily. 180 tablet 1   No current facility-administered medications for this visit.    Allergies:   Penicillins, Atorvastatin  calcium , Metoprolol , Penicillins, Repatha  [evolocumab ], and Zetia  [ezetimibe ]    Social History:   reports that she quit smoking about 2 years ago. Her smoking use included cigarettes. She started smoking about 22 years ago. She has a 10 pack-year smoking history. She has never used smokeless tobacco. She reports that she does not currently use alcohol. She reports that she does not use drugs.   Family History:  family history includes Heart attack in her father and mother.    ROS:     Review of Systems  Constitutional: Negative.   HENT: Negative.    Eyes: Negative.   Respiratory: Negative.    Cardiovascular:  Positive for chest pain.  Gastrointestinal: Negative.    Genitourinary: Negative.   Musculoskeletal: Negative.   Skin: Negative.   Neurological: Negative.   Endo/Heme/Allergies: Negative.   Psychiatric/Behavioral: Negative.    All other systems reviewed and are negative.     All other systems are reviewed and negative.    PHYSICAL EXAM: VS:  BP 131/82   Pulse 70   Resp (!) 98   Ht 5' 11 (1.803 m)   Wt 276 lb 6.4 oz (125.4 kg)   LMP 06/06/2023   BMI 38.55 kg/m  , BMI Body mass index is 38.55 kg/m. Last weight:  Wt Readings from Last 3 Encounters:  06/28/23 276 lb 6.4 oz (125.4 kg)  06/27/23 280 lb (127 kg)  06/13/23 280 lb (127 kg)     Physical Exam Constitutional:      Appearance: Normal appearance.  Cardiovascular:  Rate and Rhythm: Normal rate and regular rhythm.     Heart sounds: Normal heart sounds.  Pulmonary:     Effort: Pulmonary effort is normal.     Breath sounds: Normal breath sounds.  Musculoskeletal:     Right lower leg: No edema.     Left lower leg: No edema.  Neurological:     Mental Status: She is alert.       EKG: NST old ASWMI, t wave inversion inferiorly  Recent Labs: 08/17/2022: TSH 0.670 04/10/2023: ALT 12 06/27/2023: BUN 11; Creatinine, Ser 0.97; Hemoglobin 11.1; Platelets 266; Potassium 3.5; Sodium 137    Lipid Panel    Component Value Date/Time   CHOL 164 08/17/2022 1148   TRIG 65 08/17/2022 1148   HDL 60 08/17/2022 1148   CHOLHDL 5.0 04/05/2022 0938   VLDL 12 04/05/2022 0938   LDLCALC 91 08/17/2022 1148      Other studies Reviewed: Additional studies/ records that were reviewed today include:  Review of the above records demonstrates:      11/07/2022    3:20 PM  PAD Screen  Previous PAD dx? No  Previous surgical procedure? No  Pain with walking? No  Feet/toe relief with dangling? No  Painful, non-healing ulcers? No  Extremities discolored? No      ASSESSMENT AND PLAN:    ICD-10-CM   1. Other chest pain  R07.89 MYOCARDIAL PERFUSION IMAGING   had denial of  stress test in august and continues to have chest pain and went to E,ergency room and r/o for MI. Needs stress test ASAP    2. SOB (shortness of breath)  R06.02 MYOCARDIAL PERFUSION IMAGING    3. Coronary artery disease involving native coronary artery of native heart without angina pectoris  I25.10 MYOCARDIAL PERFUSION IMAGING    4. Primary hypertension  I10 MYOCARDIAL PERFUSION IMAGING    5. Mixed hyperlipidemia  E78.2 MYOCARDIAL PERFUSION IMAGING    6. Tobacco abuse  Z72.0 MYOCARDIAL PERFUSION IMAGING    7. Unstable angina pectoris (HCC)  I20.0 MYOCARDIAL PERFUSION IMAGING       Problem List Items Addressed This Visit       Cardiovascular and Mediastinum   Coronary artery disease   Relevant Orders   MYOCARDIAL PERFUSION IMAGING   Unstable angina pectoris (HCC)   Relevant Orders   MYOCARDIAL PERFUSION IMAGING   HTN (hypertension)   Relevant Orders   MYOCARDIAL PERFUSION IMAGING     Other   Tobacco abuse   Relevant Orders   MYOCARDIAL PERFUSION IMAGING   HLD (hyperlipidemia)   Relevant Orders   MYOCARDIAL PERFUSION IMAGING   Chest pain - Primary   Relevant Orders   MYOCARDIAL PERFUSION IMAGING   MYOCARDIAL PERFUSION IMAGING   Other Visit Diagnoses       SOB (shortness of breath)       Relevant Orders   MYOCARDIAL PERFUSION IMAGING          Disposition:   Return in about 2 weeks (around 07/12/2023) for stress test next monday as wass denied by [employer] before but went to ER with chest pain .    Total time spent: 30 minutes  Signed,  Denyse Bathe, MD  06/28/2023 9:33 AM    [facility]

## 2023-07-10 ENCOUNTER — Ambulatory Visit (INDEPENDENT_AMBULATORY_CARE_PROVIDER_SITE_OTHER): Payer: PRIVATE HEALTH INSURANCE

## 2023-07-10 DIAGNOSIS — I10 Essential (primary) hypertension: Secondary | ICD-10-CM

## 2023-07-10 DIAGNOSIS — I2510 Atherosclerotic heart disease of native coronary artery without angina pectoris: Secondary | ICD-10-CM | POA: Diagnosis not present

## 2023-07-10 DIAGNOSIS — E782 Mixed hyperlipidemia: Secondary | ICD-10-CM

## 2023-07-10 DIAGNOSIS — Z720 Tobacco use: Secondary | ICD-10-CM

## 2023-07-10 DIAGNOSIS — R0789 Other chest pain: Secondary | ICD-10-CM

## 2023-07-10 DIAGNOSIS — R0602 Shortness of breath: Secondary | ICD-10-CM

## 2023-07-10 DIAGNOSIS — I200 Unstable angina: Secondary | ICD-10-CM

## 2023-07-11 ENCOUNTER — Ambulatory Visit: Payer: PRIVATE HEALTH INSURANCE | Admitting: Cardiology

## 2023-07-11 MED ORDER — TECHNETIUM TC 99M SESTAMIBI GENERIC - CARDIOLITE
32.4000 | Freq: Once | INTRAVENOUS | Status: AC | PRN
  Administered 2023-07-10: 32.4 via INTRAVENOUS

## 2023-07-11 MED ORDER — TECHNETIUM TC 99M SESTAMIBI GENERIC - CARDIOLITE
10.8900 | Freq: Once | INTRAVENOUS | Status: AC | PRN
  Administered 2023-07-11: 10.89 via INTRAVENOUS

## 2023-07-18 ENCOUNTER — Ambulatory Visit: Payer: PRIVATE HEALTH INSURANCE | Admitting: Cardiology

## 2023-07-25 ENCOUNTER — Ambulatory Visit: Payer: PRIVATE HEALTH INSURANCE | Admitting: Cardiology

## 2023-07-26 ENCOUNTER — Ambulatory Visit (INDEPENDENT_AMBULATORY_CARE_PROVIDER_SITE_OTHER): Payer: Medicaid Other | Admitting: Cardiovascular Disease

## 2023-07-26 ENCOUNTER — Encounter: Payer: Self-pay | Admitting: Cardiovascular Disease

## 2023-07-26 VITALS — BP 128/72 | HR 73 | Ht 71.0 in | Wt 274.0 lb

## 2023-07-26 DIAGNOSIS — R079 Chest pain, unspecified: Secondary | ICD-10-CM

## 2023-07-26 DIAGNOSIS — R600 Localized edema: Secondary | ICD-10-CM | POA: Diagnosis not present

## 2023-07-26 DIAGNOSIS — I10 Essential (primary) hypertension: Secondary | ICD-10-CM | POA: Diagnosis not present

## 2023-07-26 DIAGNOSIS — I2510 Atherosclerotic heart disease of native coronary artery without angina pectoris: Secondary | ICD-10-CM

## 2023-07-26 NOTE — Progress Notes (Signed)
Cardiology Office Note   Date:  07/26/2023   ID:  Sonya [name], DOB [birth year], MRN [id number]  PCP:  Marisue Ivan, NP  Cardiologist:  Adrian Blackwater, MD      History of Present Illness: Sonya [name] is a 43 y.o. female who presents for  Chief Complaint  Patient presents with   Follow-up    4 week follow up  Nst resutls    Doing  better, infrequent chest pains      Past Medical History:  Diagnosis Date   Coronary artery disease    GERD (gastroesophageal reflux disease)    H/O heart artery stent    Hyperlipidemia    Hypertension    Tobacco abuse      Past Surgical History:  Procedure Laterality Date   CORONARY STENT INTERVENTION N/A 06/10/2018   Procedure: CORONARY STENT INTERVENTION;  Surgeon: Alwyn Pea, MD;  Location: [facility];  Service: Cardiovascular;  Laterality: N/A;   CORONARY STENT INTERVENTION N/A 04/14/2021   Procedure: CORONARY STENT INTERVENTION;  Surgeon: Marcina Millard, MD;  Location: [facility];  Service: Cardiovascular;  Laterality: N/A;   CORONARY STENT INTERVENTION N/A 01/20/2022   Procedure: CORONARY STENT INTERVENTION;  Surgeon: Marcina Millard, MD;  Location: [facility];  Service: Cardiovascular;  Laterality: N/A;   CORONARY/GRAFT ACUTE MI REVASCULARIZATION N/A 10/11/2021   Procedure: Coronary/Graft Acute MI Revascularization;  Surgeon: Yvonne Kendall, MD;  Location: [facility];  Service: Cardiovascular;  Laterality: N/A;   LEFT HEART CATH AND CORONARY ANGIOGRAPHY N/A 06/10/2018   Procedure: With coronary intervention and stenting;  Surgeon: Laurier Nancy, MD;  Location: [facility];  Service: Cardiovascular;  Laterality: N/A;   LEFT HEART CATH AND CORONARY ANGIOGRAPHY Left 06/15/2020   Procedure: LEFT HEART CATH AND CORONARY ANGIOGRAPHY;  Surgeon: Laurier Nancy, MD;  Location: [facility];  Service: Cardiovascular;  Laterality: Left;   LEFT HEART CATH  AND CORONARY ANGIOGRAPHY N/A 04/14/2021   Procedure: LEFT HEART CATH AND CORONARY ANGIOGRAPHY with intervention;  Surgeon: Laurier Nancy, MD;  Location: [facility];  Service: Cardiovascular;  Laterality: N/A;   LEFT HEART CATH AND CORONARY ANGIOGRAPHY N/A 10/11/2021   Procedure: LEFT HEART CATH AND CORONARY ANGIOGRAPHY;  Surgeon: Yvonne Kendall, MD;  Location: [facility];  Service: Cardiovascular;  Laterality: N/A;   LEFT HEART CATH AND CORONARY ANGIOGRAPHY N/A 01/20/2022   Procedure: LEFT HEART CATH AND CORONARY ANGIOGRAPHY;  Surgeon: Laurier Nancy, MD;  Location: [facility];  Service: Cardiovascular;  Laterality: N/A;   LEFT HEART CATH AND CORONARY ANGIOGRAPHY N/A 04/05/2022   Procedure: LEFT HEART CATH AND CORONARY ANGIOGRAPHY;  Surgeon: Corky Crafts, MD;  Location: [facility];  Service: Cardiovascular;  Laterality: N/A;   TUBAL LIGATION       Current Outpatient Medications  Medication Sig Dispense Refill   amLODipine (NORVASC) 5 MG tablet Take 1 tablet (5 mg total) by mouth daily. 30 tablet 11   aspirin (ASPIRIN CHILDRENS) 81 MG chewable tablet Chew 1 tablet (81 mg total) by mouth daily. 30 tablet 0   atorvastatin (LIPITOR) 80 MG tablet Take 1 tablet (80 mg total) by mouth daily. 30 tablet 11   Bacillus Coagulans-Inulin (PROBIOTIC) 1-250 BILLION-MG CAPS Take 1 capsule by mouth daily. 30 capsule 0   busPIRone (BUSPAR) 10 MG tablet Take 10 mg by mouth 2 (two) times daily as needed.     carvedilol (  COREG) 12.5 MG tablet TAKE 1 TABLET BY MOUTH TWICE A DAY 180 tablet 1   carvedilol (COREG) 25 MG tablet Take 1 tablet (25 mg total) by mouth 2 (two) times daily. 60 tablet 11   Cholecalciferol (VITAMIN D) 125 MCG (5000 UT) CAPS Take 5,000 Units by mouth daily.     Cyclobenzaprine HCl (FLEXERIL PO) Take 5 mg by mouth. prn     Evolocumab 140 MG/ML SOAJ Inject 140 mg into the skin every 14 (fourteen) days. 2 mL 5   isosorbide mononitrate (IMDUR) 120  MG 24 hr tablet TAKE 1 TABLET BY MOUTH TWICE A DAY 180 tablet 1   nitroGLYCERIN (NITROSTAT) 0.4 MG SL tablet Place 1 tablet (0.4 mg total) under the tongue every 5 (five) minutes as needed for chest pain. 30 tablet 12   nystatin cream (MYCOSTATIN) Apply 1 application topically at bedtime as needed (irritation).     pantoprazole (PROTONIX) 40 MG tablet TAKE 1 TABLET BY MOUTH TWICE A DAY 180 tablet 1   prasugrel (EFFIENT) 10 MG TABS tablet TAKE 1 TABLET BY MOUTH EVERY DAY 90 tablet 1   ranolazine (RANEXA) 1000 MG SR tablet Take 1 tablet (1,000 mg total) by mouth 2 (two) times daily. 180 tablet 1   No current facility-administered medications for this visit.    Allergies:   Penicillins, Atorvastatin calcium, Metoprolol, Penicillins, Repatha [evolocumab], and Zetia [ezetimibe]    Social History:   reports that she quit smoking about 2 years ago. Her smoking use included cigarettes. She started smoking about 22 years ago. She has a 10 pack-year smoking history. She has never used smokeless tobacco. She reports that she does not currently use alcohol. She reports that she does not use drugs.   Family History:  family history includes Heart attack in her father and mother.    ROS:     Review of Systems  Constitutional: Negative.   HENT: Negative.    Eyes: Negative.   Respiratory: Negative.    Gastrointestinal: Negative.   Genitourinary: Negative.   Musculoskeletal: Negative.   Skin: Negative.   Neurological: Negative.   Endo/Heme/Allergies: Negative.   Psychiatric/Behavioral: Negative.    All other systems reviewed and are negative.     All other systems are reviewed and negative.    PHYSICAL EXAM: VS:  BP 128/72   Pulse 73   Ht 5\' 11"  (1.803 m)   Wt 274 lb (124.3 kg)   LMP 06/06/2023   SpO2 98%   BMI 38.22 kg/m  , BMI Body mass index is 38.22 kg/m. Last weight:  Wt Readings from Last 3 Encounters:  07/26/23 274 lb (124.3 kg)  06/28/23 276 lb 6.4 oz (125.4 kg)  06/27/23  280 lb (127 kg)     Physical Exam Constitutional:      Appearance: Normal appearance.  Cardiovascular:     Rate and Rhythm: Normal rate and regular rhythm.     Heart sounds: Normal heart sounds.  Pulmonary:     Effort: Pulmonary effort is normal.     Breath sounds: Normal breath sounds.  Musculoskeletal:     Right lower leg: No edema.     Left lower leg: No edema.  Neurological:     Mental Status: She is alert.       EKG:   Recent Labs: 08/17/2022: TSH 0.670 04/10/2023: ALT 12 06/27/2023: BUN 11; Creatinine, Ser 0.97; Hemoglobin 11.1; Platelets 266; Potassium 3.5; Sodium 137    Lipid Panel    Component Value Date/Time  CHOL 164 08/17/2022 1148   TRIG 65 08/17/2022 1148   HDL 60 08/17/2022 1148   CHOLHDL 5.0 04/05/2022 0938   VLDL 12 04/05/2022 0938   LDLCALC 91 08/17/2022 1148      Other studies Reviewed: Additional studies/ records that were reviewed today include:  Review of the above records demonstrates:      11/07/2022    3:20 PM  PAD Screen  Previous PAD dx? No  Previous surgical procedure? No  Pain with walking? No  Feet/toe relief with dangling? No  Painful, non-healing ulcers? No  Extremities discolored? No      ASSESSMENT AND PLAN:    ICD-10-CM   1. Coronary artery disease involving native coronary artery of native heart without angina pectoris  I25.10     2. Primary hypertension  I10     3. Bilateral lower extremity edema  R60.0     4. Chest pain, rule out acute myocardial infarction  R07.9    stress  test was normal, doing better       Problem List Items Addressed This Visit       Cardiovascular and Mediastinum   Coronary artery disease - Primary   HTN (hypertension)     Other   Chest pain, rule out acute myocardial infarction   Bilateral lower extremity edema       Disposition:   Return in about 3 months (around 10/24/2023).    Total time spent: 30 minutes  Signed,  Adrian Blackwater, MD  07/26/2023 9:30 AM     [facility]

## 2023-08-13 ENCOUNTER — Other Ambulatory Visit: Payer: Self-pay | Admitting: Cardiovascular Disease

## 2023-09-15 ENCOUNTER — Other Ambulatory Visit: Payer: Self-pay | Admitting: Cardiovascular Disease

## 2023-10-24 ENCOUNTER — Ambulatory Visit: Payer: PRIVATE HEALTH INSURANCE | Admitting: Cardiology

## 2023-11-05 ENCOUNTER — Other Ambulatory Visit: Payer: Self-pay | Admitting: Cardiovascular Disease

## 2023-11-07 ENCOUNTER — Ambulatory Visit: Payer: PRIVATE HEALTH INSURANCE | Admitting: Cardiology

## 2023-11-28 ENCOUNTER — Ambulatory Visit: Admitting: Cardiology

## 2023-12-12 ENCOUNTER — Ambulatory Visit: Admitting: Cardiology

## 2023-12-14 ENCOUNTER — Other Ambulatory Visit: Payer: Self-pay | Admitting: Cardiology

## 2023-12-19 ENCOUNTER — Other Ambulatory Visit: Payer: Self-pay | Admitting: Cardiology

## 2023-12-19 ENCOUNTER — Ambulatory Visit (INDEPENDENT_AMBULATORY_CARE_PROVIDER_SITE_OTHER): Payer: Self-pay | Admitting: Cardiology

## 2023-12-19 ENCOUNTER — Encounter: Payer: Self-pay | Admitting: Cardiology

## 2023-12-19 VITALS — BP 125/79 | HR 78 | Ht 71.0 in | Wt 283.0 lb

## 2023-12-19 DIAGNOSIS — E669 Obesity, unspecified: Secondary | ICD-10-CM

## 2023-12-19 DIAGNOSIS — I25110 Atherosclerotic heart disease of native coronary artery with unstable angina pectoris: Secondary | ICD-10-CM

## 2023-12-19 DIAGNOSIS — I2510 Atherosclerotic heart disease of native coronary artery without angina pectoris: Secondary | ICD-10-CM

## 2023-12-19 DIAGNOSIS — R7303 Prediabetes: Secondary | ICD-10-CM | POA: Diagnosis not present

## 2023-12-19 DIAGNOSIS — Z1329 Encounter for screening for other suspected endocrine disorder: Secondary | ICD-10-CM

## 2023-12-19 DIAGNOSIS — I10 Essential (primary) hypertension: Secondary | ICD-10-CM

## 2023-12-19 DIAGNOSIS — E782 Mixed hyperlipidemia: Secondary | ICD-10-CM | POA: Diagnosis not present

## 2023-12-19 DIAGNOSIS — I200 Unstable angina: Secondary | ICD-10-CM

## 2023-12-19 MED ORDER — FAMOTIDINE 20 MG PO TABS: 20.0000 mg | ORAL_TABLET | Freq: Two times a day (BID) | ORAL | 4 refills | Status: AC

## 2023-12-19 MED ORDER — WEGOVY 0.25 MG/0.5ML ~~LOC~~ SOAJ
0.2500 mg | SUBCUTANEOUS | 3 refills | Status: DC
Start: 2023-12-19 — End: 2024-04-24

## 2023-12-19 NOTE — Progress Notes (Unsigned)
 Cardiology Office Note   Date:  12/20/2023   ID:  Sonya [name], DOB Feb 04, 1981, MRN [id number]  PCP:  Carin Gauze, NP  Cardiologist:  Gauze Carin, NP      History of Present Illness: Sonya [name] is a 43 y.o. female who presents for  Chief Complaint  Patient presents with   Acute Visit    Patieyn in office for an acute visit, complaining of chest discomfort. Patient reports taking one nitroglycerin  with relief. EKG today shows no acute changes.  Patient admits to not taking pantoprazole  due to feeling forgetful while taking it. Will add Pepcid . Patient requesting a GLP1 for weight loss.  Patient is obese and pre diabetic. Patient also has a history of heart disease with previous coronary stenting.  Patient fasting, will get lab work today.       Past Medical History:  Diagnosis Date   Coronary artery disease    GERD (gastroesophageal reflux disease)    H/O heart artery stent    Hyperlipidemia    Hypertension    Tobacco abuse      Past Surgical History:  Procedure Laterality Date   CORONARY STENT INTERVENTION N/A 06/10/2018   Procedure: CORONARY STENT INTERVENTION;  Surgeon: Florencio Cara BIRCH, MD;  Location: [facility] INVASIVE CV [facility];  Service: Cardiovascular;  Laterality: N/A;   CORONARY STENT INTERVENTION N/A 04/14/2021   Procedure: CORONARY STENT INTERVENTION;  Surgeon: Ammon Blunt, MD;  Location: [facility];  Service: Cardiovascular;  Laterality: N/A;   CORONARY STENT INTERVENTION N/A 01/20/2022   Procedure: CORONARY STENT INTERVENTION;  Surgeon: Ammon Blunt, MD;  Location: [facility];  Service: Cardiovascular;  Laterality: N/A;   CORONARY/GRAFT ACUTE MI REVASCULARIZATION N/A 10/11/2021   Procedure: Coronary/Graft Acute MI Revascularization;  Surgeon: Mady Bruckner, MD;  Location: [facility];  Service: Cardiovascular;  Laterality: N/A;   LEFT HEART CATH AND CORONARY ANGIOGRAPHY N/A 06/10/2018   Procedure:  With coronary intervention and stenting;  Surgeon: Fernand Denyse LABOR, MD;  Location: [facility] CV [facility];  Service: Cardiovascular;  Laterality: N/A;   LEFT HEART CATH AND CORONARY ANGIOGRAPHY Left 06/15/2020   Procedure: LEFT HEART CATH AND CORONARY ANGIOGRAPHY;  Surgeon: Fernand Denyse LABOR, MD;  Location: [facility];  Service: Cardiovascular;  Laterality: Left;   LEFT HEART CATH AND CORONARY ANGIOGRAPHY N/A 04/14/2021   Procedure: LEFT HEART CATH AND CORONARY ANGIOGRAPHY with intervention;  Surgeon: Fernand Denyse LABOR, MD;  Location: [facility];  Service: Cardiovascular;  Laterality: N/A;   LEFT HEART CATH AND CORONARY ANGIOGRAPHY N/A 10/11/2021   Procedure: LEFT HEART CATH AND CORONARY ANGIOGRAPHY;  Surgeon: Mady Bruckner, MD;  Location: [facility];  Service: Cardiovascular;  Laterality: N/A;   LEFT HEART CATH AND CORONARY ANGIOGRAPHY N/A 01/20/2022   Procedure: LEFT HEART CATH AND CORONARY ANGIOGRAPHY;  Surgeon: Fernand Denyse LABOR, MD;  Location: [facility];  Service: Cardiovascular;  Laterality: N/A;   LEFT HEART CATH AND CORONARY ANGIOGRAPHY N/A 04/05/2022   Procedure: LEFT HEART CATH AND CORONARY ANGIOGRAPHY;  Surgeon: Dann Candyce RAMAN, MD;  Location: [facility] INVASIVE CV LAB;  Service: Cardiovascular;  Laterality: N/A;   TUBAL LIGATION       Current Outpatient Medications  Medication Sig Dispense Refill   amLODipine  (NORVASC ) 5 MG tablet TAKE 1 TABLET (5 MG TOTAL) BY MOUTH DAILY. 90 tablet 3   aspirin  (ASPIRIN  CHILDRENS) 81 MG chewable tablet Chew 1 tablet (81 mg total) by mouth daily. 30  tablet 0   atorvastatin  (LIPITOR ) 80 MG tablet Take 1 tablet (80 mg total) by mouth daily. 30 tablet 11   Bacillus Coagulans-Inulin (PROBIOTIC) 1-250 BILLION-MG CAPS Take 1 capsule by mouth daily. 30 capsule 0   carvedilol  (COREG ) 12.5 MG tablet TAKE 1 TABLET BY MOUTH TWICE A DAY 180 tablet 1   Cholecalciferol  (VITAMIN D ) 125 MCG (5000 UT) CAPS Take 5,000 Units by mouth  daily.     Cyclobenzaprine HCl (FLEXERIL PO) Take 5 mg by mouth. prn     Evolocumab  140 MG/ML SOAJ Inject 140 mg into the skin every 14 (fourteen) days. 2 mL 5   famotidine  (PEPCID ) 20 MG tablet Take 1 tablet (20 mg total) by mouth 2 (two) times daily. 60 tablet 4   isosorbide  mononitrate (IMDUR ) 120 MG 24 hr tablet TAKE 1 TABLET BY MOUTH TWICE A DAY 180 tablet 1   nitroGLYCERIN  (NITROSTAT ) 0.4 MG SL tablet Place 1 tablet (0.4 mg total) under the tongue every 5 (five) minutes as needed for chest pain. 30 tablet 12   nystatin cream (MYCOSTATIN) Apply 1 application topically at bedtime as needed (irritation).     prasugrel  (EFFIENT ) 10 MG TABS tablet TAKE 1 TABLET BY MOUTH EVERY DAY 90 tablet 1   ranolazine  (RANEXA ) 1000 MG SR tablet TAKE 1 TABLET BY MOUTH TWICE A DAY 180 tablet 1   Semaglutide-Weight Management (WEGOVY) 0.25 MG/0.5ML SOAJ Inject 0.25 mg into the skin once a week. 2 mL 3   busPIRone  (BUSPAR ) 10 MG tablet Take 10 mg by mouth 2 (two) times daily as needed. (Patient not taking: Reported on 12/19/2023)     carvedilol  (COREG ) 25 MG tablet Take 1 tablet (25 mg total) by mouth 2 (two) times daily. (Patient not taking: Reported on 12/19/2023) 60 tablet 11   No current facility-administered medications for this visit.    Allergies:   Penicillins, Atorvastatin  calcium , Metoprolol , Penicillins, Repatha  [evolocumab ], and Zetia  [ezetimibe ]    Social History:   reports that she quit smoking about 2 years ago. Her smoking use included cigarettes. She started smoking about 22 years ago. She has a 10 pack-year smoking history. She has never used smokeless tobacco. She reports that she does not currently use alcohol. She reports that she does not use drugs.   Family History:  family history includes Heart attack in her father and mother.    ROS:     Review of Systems  Constitutional: Negative.   HENT: Negative.    Eyes: Negative.   Respiratory: Negative.    Cardiovascular:  Positive for  chest pain.  Gastrointestinal: Negative.   Genitourinary: Negative.   Musculoskeletal: Negative.   Skin: Negative.   Neurological: Negative.   Endo/Heme/Allergies: Negative.   Psychiatric/Behavioral: Negative.    All other systems reviewed and are negative.     All other systems are reviewed and negative.    PHYSICAL EXAM: VS:  BP 125/79   Pulse 78   Ht 5' 11 (1.803 m)   Wt 283 lb (128.4 kg)   SpO2 99%   BMI 39.47 kg/m  , BMI Body mass index is 39.47 kg/m. Last weight:  Wt Readings from Last 3 Encounters:  12/19/23 283 lb (128.4 kg)  07/26/23 274 lb (124.3 kg)  06/28/23 276 lb 6.4 oz (125.4 kg)     Physical Exam Vitals and nursing note reviewed.  Constitutional:      Appearance: Normal appearance. She is normal weight.  HENT:     Head: Normocephalic and atraumatic.  Nose: Nose normal.     Mouth/Throat:     Mouth: Mucous membranes are moist.     Pharynx: Oropharynx is clear.   Eyes:     Conjunctiva/sclera: Conjunctivae normal.     Pupils: Pupils are equal, round, and reactive to light.    Cardiovascular:     Rate and Rhythm: Normal rate and regular rhythm.     Pulses: Normal pulses.     Heart sounds: Normal heart sounds.  Pulmonary:     Effort: Pulmonary effort is normal.     Breath sounds: Normal breath sounds.  Abdominal:     General: Abdomen is flat. Bowel sounds are normal.     Palpations: Abdomen is soft.   Musculoskeletal:        General: Normal range of motion.     Cervical back: Normal range of motion.   Skin:    General: Skin is warm and dry.   Neurological:     General: No focal deficit present.     Mental Status: She is alert and oriented to person, place, and time. Mental status is at baseline.   Psychiatric:        Mood and Affect: Mood normal.        Behavior: Behavior normal.      EKG: NSR, HR 72 bpm, no acute changes  Recent Labs: 12/19/2023: ALT 13; BUN 8; Creatinine, Ser 0.90; Hemoglobin 13.9; Platelets 236; Potassium  3.7; Sodium 139; TSH 0.865    Lipid Panel    Component Value Date/Time   CHOL 171 12/19/2023 0958   TRIG 95 12/19/2023 0958   HDL 51 12/19/2023 0958   CHOLHDL 3.4 12/19/2023 0958   CHOLHDL 5.0 04/05/2022 0938   VLDL 12 04/05/2022 0938   LDLCALC 103 (H) 12/19/2023 0958      ASSESSMENT AND PLAN:    ICD-10-CM   1. Unstable angina (HCC)  I20.0 CBC with Differential/Platelet    2. Coronary artery disease involving native coronary artery of native heart without angina pectoris  I25.10 Semaglutide-Weight Management (WEGOVY) 0.25 MG/0.5ML SOAJ    3. Primary hypertension  I10 CMP14+EGFR    4. Mixed hyperlipidemia  E78.2 Lipid Profile    5. Prediabetes  R73.03 Hemoglobin A1c    Semaglutide-Weight Management (WEGOVY) 0.25 MG/0.5ML SOAJ    6. Thyroid  disorder screening  Z13.29 TSH    7. Obesity (BMI 35.0-39.9 without comorbidity)  E66.9 Semaglutide-Weight Management (WEGOVY) 0.25 MG/0.5ML SOAJ       Problem List Items Addressed This Visit       Cardiovascular and Mediastinum   Unstable angina (HCC) - Primary   Relevant Orders   CBC with Differential/Platelet (Completed)   Coronary artery disease   Occasional chest pain. Stopped pantoprazole  due to forgetfulness. Will add Pepcid . Will add Wegovy for weight loss and history of CAD.       Relevant Medications   Semaglutide-Weight Management (WEGOVY) 0.25 MG/0.5ML SOAJ   HTN (hypertension)   Controlled, continue same medications.       Relevant Orders   CMP14+EGFR (Completed)     Other   HLD (hyperlipidemia)   Recheck today. Will call with the results.       Relevant Orders   Lipid Profile (Completed)   Prediabetes   Relevant Medications   Semaglutide-Weight Management (WEGOVY) 0.25 MG/0.5ML SOAJ   Other Relevant Orders   Hemoglobin A1c (Completed)   Obesity (BMI 35.0-39.9 without comorbidity)   The patient is asked to make an attempt to improve diet and exercise  patterns to aid in medical management of this  problem.       Relevant Medications   Semaglutide-Weight Management (WEGOVY) 0.25 MG/0.5ML SOAJ   Other Visit Diagnoses       Thyroid  disorder screening       Relevant Orders   TSH (Completed)        Disposition:   Return in about 2 weeks (around 01/02/2024).    Total time spent: 25 minutes  Signed,  Jeoffrey Pollen, NP  12/20/2023 12:16 PM    [facility]

## 2023-12-20 ENCOUNTER — Ambulatory Visit: Payer: Self-pay | Admitting: Cardiology

## 2023-12-20 LAB — CBC WITH DIFFERENTIAL/PLATELET
Basophils Absolute: 0 10*3/uL (ref 0.0–0.2)
Basos: 0 %
EOS (ABSOLUTE): 0.1 10*3/uL (ref 0.0–0.4)
Eos: 1 %
Hematocrit: 45.9 % (ref 34.0–46.6)
Hemoglobin: 13.9 g/dL (ref 11.1–15.9)
Immature Grans (Abs): 0 10*3/uL (ref 0.0–0.1)
Immature Granulocytes: 0 %
Lymphocytes Absolute: 3.3 10*3/uL — ABNORMAL HIGH (ref 0.7–3.1)
Lymphs: 34 %
MCH: 23.5 pg — ABNORMAL LOW (ref 26.6–33.0)
MCHC: 30.3 g/dL — ABNORMAL LOW (ref 31.5–35.7)
MCV: 78 fL — ABNORMAL LOW (ref 79–97)
Monocytes Absolute: 0.7 10*3/uL (ref 0.1–0.9)
Monocytes: 8 %
Neutrophils Absolute: 5.4 10*3/uL (ref 1.4–7.0)
Neutrophils: 57 %
Platelets: 236 10*3/uL (ref 150–450)
RBC: 5.92 x10E6/uL — ABNORMAL HIGH (ref 3.77–5.28)
RDW: 17.6 % — ABNORMAL HIGH (ref 11.7–15.4)
WBC: 9.5 10*3/uL (ref 3.4–10.8)

## 2023-12-20 LAB — CMP14+EGFR
ALT: 13 IU/L (ref 0–32)
AST: 15 IU/L (ref 0–40)
Albumin: 3.8 g/dL — ABNORMAL LOW (ref 3.9–4.9)
Alkaline Phosphatase: 68 IU/L (ref 44–121)
BUN/Creatinine Ratio: 9 (ref 9–23)
BUN: 8 mg/dL (ref 6–24)
Bilirubin Total: 0.2 mg/dL (ref 0.0–1.2)
CO2: 18 mmol/L — ABNORMAL LOW (ref 20–29)
Calcium: 8.8 mg/dL (ref 8.7–10.2)
Chloride: 107 mmol/L — ABNORMAL HIGH (ref 96–106)
Creatinine, Ser: 0.9 mg/dL (ref 0.57–1.00)
Globulin, Total: 2.4 g/dL (ref 1.5–4.5)
Glucose: 114 mg/dL — ABNORMAL HIGH (ref 70–99)
Potassium: 3.7 mmol/L (ref 3.5–5.2)
Sodium: 139 mmol/L (ref 134–144)
Total Protein: 6.2 g/dL (ref 6.0–8.5)
eGFR: 81 mL/min/{1.73_m2} (ref >59)

## 2023-12-20 LAB — HEMOGLOBIN A1C
Est. average glucose Bld gHb Est-mCnc: 126 mg/dL
Hgb A1c MFr Bld: 6 % — ABNORMAL HIGH (ref 4.8–5.6)

## 2023-12-20 LAB — LIPID PANEL
Chol/HDL Ratio: 3.4 ratio (ref 0.0–4.4)
Cholesterol, Total: 171 mg/dL (ref 100–199)
HDL: 51 mg/dL (ref >39)
LDL Chol Calc (NIH): 103 mg/dL — ABNORMAL HIGH (ref 0–99)
Triglycerides: 95 mg/dL (ref 0–149)
VLDL Cholesterol Cal: 17 mg/dL (ref 5–40)

## 2023-12-20 LAB — TSH: TSH: 0.865 u[IU]/mL (ref 0.450–4.500)

## 2023-12-20 NOTE — Assessment & Plan Note (Signed)
 Recheck today. Will call with the results.

## 2023-12-20 NOTE — Assessment & Plan Note (Signed)
Controlled, continue same medications

## 2023-12-20 NOTE — Assessment & Plan Note (Signed)
 The patient is asked to make an attempt to improve diet and exercise patterns to aid in medical management of this problem.

## 2023-12-20 NOTE — Assessment & Plan Note (Signed)
 Occasional chest pain. Stopped pantoprazole  due to forgetfulness. Will add Pepcid . Will add Wegovy for weight loss and history of CAD.

## 2023-12-21 ENCOUNTER — Encounter: Payer: Self-pay | Admitting: Cardiovascular Disease

## 2023-12-26 ENCOUNTER — Ambulatory Visit: Admitting: Cardiology

## 2024-01-02 ENCOUNTER — Encounter: Payer: Self-pay | Admitting: Cardiology

## 2024-01-02 ENCOUNTER — Ambulatory Visit: Admitting: Cardiology

## 2024-01-02 VITALS — BP 119/85 | HR 75 | Ht 71.0 in | Wt 281.0 lb

## 2024-01-02 DIAGNOSIS — I2510 Atherosclerotic heart disease of native coronary artery without angina pectoris: Secondary | ICD-10-CM

## 2024-01-02 MED ORDER — ATORVASTATIN CALCIUM 80 MG PO TABS: 80.0000 mg | ORAL_TABLET | Freq: Every day | ORAL | 11 refills | Status: AC

## 2024-01-02 NOTE — Assessment & Plan Note (Signed)
 Patient returns today for 2 week follow up. Patient reports cheat discomfort is gone, no further pain. Patient does not currently have medication insurance, was unable to get Wegovy . Patient will notify office when Medicaid is re-instated.

## 2024-01-02 NOTE — Progress Notes (Signed)
 Cardiology Office Note   Date:  01/02/2024   ID:  GEANINE VANDEKAMP, DOB June 19, 1981, MRN [id number]  PCP:  Carin Gauze, NP  Cardiologist:  Gauze Carin, NP      History of Present Illness: Sonya [name] is a 43 y.o. female who presents for  Chief Complaint  Patient presents with   Follow-up    3 month follow up    Patient in office for 2 week follow up. Patient reports no further chest discomfort.      Past Medical History:  Diagnosis Date   Coronary artery disease    GERD (gastroesophageal reflux disease)    H/O heart artery stent    Hyperlipidemia    Hypertension    Tobacco abuse      Past Surgical History:  Procedure Laterality Date   CORONARY STENT INTERVENTION N/A 06/10/2018   Procedure: CORONARY STENT INTERVENTION;  Surgeon: Florencio Cara BIRCH, MD;  Location: [facility];  Service: Cardiovascular;  Laterality: N/A;   CORONARY STENT INTERVENTION N/A 04/14/2021   Procedure: CORONARY STENT INTERVENTION;  Surgeon: Ammon Blunt, MD;  Location: [facility];  Service: Cardiovascular;  Laterality: N/A;   CORONARY STENT INTERVENTION N/A 01/20/2022   Procedure: CORONARY STENT INTERVENTION;  Surgeon: Ammon Blunt, MD;  Location: [facility];  Service: Cardiovascular;  Laterality: N/A;   CORONARY/GRAFT ACUTE MI REVASCULARIZATION N/A 10/11/2021   Procedure: Coronary/Graft Acute MI Revascularization;  Surgeon: Mady Bruckner, MD;  Location: [facility];  Service: Cardiovascular;  Laterality: N/A;   LEFT HEART CATH AND CORONARY ANGIOGRAPHY N/A 06/10/2018   Procedure: With coronary intervention and stenting;  Surgeon: Fernand Denyse LABOR, MD;  Location: [facility] INVASIVE CV LAB;  Service: Cardiovascular;  Laterality: N/A;   LEFT HEART CATH AND CORONARY ANGIOGRAPHY Left 06/15/2020   Procedure: LEFT HEART CATH AND CORONARY ANGIOGRAPHY;  Surgeon: Fernand Denyse LABOR, MD;  Location: [facility];  Service: Cardiovascular;   Laterality: Left;   LEFT HEART CATH AND CORONARY ANGIOGRAPHY N/A 04/14/2021   Procedure: LEFT HEART CATH AND CORONARY ANGIOGRAPHY with intervention;  Surgeon: Fernand Denyse LABOR, MD;  Location: [facility];  Service: Cardiovascular;  Laterality: N/A;   LEFT HEART CATH AND CORONARY ANGIOGRAPHY N/A 10/11/2021   Procedure: LEFT HEART CATH AND CORONARY ANGIOGRAPHY;  Surgeon: Mady Bruckner, MD;  Location: [facility];  Service: Cardiovascular;  Laterality: N/A;   LEFT HEART CATH AND CORONARY ANGIOGRAPHY N/A 01/20/2022   Procedure: LEFT HEART CATH AND CORONARY ANGIOGRAPHY;  Surgeon: Fernand Denyse LABOR, MD;  Location: [facility];  Service: Cardiovascular;  Laterality: N/A;   LEFT HEART CATH AND CORONARY ANGIOGRAPHY N/A 04/05/2022   Procedure: LEFT HEART CATH AND CORONARY ANGIOGRAPHY;  Surgeon: Dann Candyce RAMAN, MD;  Location: [facility] INVASIVE CV LAB;  Service: Cardiovascular;  Laterality: N/A;   TUBAL LIGATION       Current Outpatient Medications  Medication Sig Dispense Refill   aspirin  (ASPIRIN  CHILDRENS) 81 MG chewable tablet Chew 1 tablet (81 mg total) by mouth daily. 30 tablet 0   atorvastatin  (LIPITOR ) 80 MG tablet Take 1 tablet (80 mg total) by mouth daily. 30 tablet 11   Bacillus Coagulans-Inulin (PROBIOTIC) 1-250 BILLION-MG CAPS Take 1 capsule by mouth daily. 30 capsule 0   carvedilol  (COREG ) 12.5 MG tablet TAKE 1 TABLET BY MOUTH TWICE A DAY 180 tablet 1   Cholecalciferol  (VITAMIN D ) 125 MCG (5000 UT) CAPS Take 5,000 Units by mouth daily.  Cyclobenzaprine HCl (FLEXERIL PO) Take 5 mg by mouth. prn     Evolocumab  140 MG/ML SOAJ Inject 140 mg into the skin every 14 (fourteen) days. 2 mL 5   famotidine  (PEPCID ) 20 MG tablet Take 1 tablet (20 mg total) by mouth 2 (two) times daily. 60 tablet 4   isosorbide  mononitrate (IMDUR ) 120 MG 24 hr tablet TAKE 1 TABLET BY MOUTH TWICE A DAY (Patient taking differently: Take 120 mg by mouth 2 (two) times daily. Takes half a pill  twice a day) 180 tablet 1   nitroGLYCERIN  (NITROSTAT ) 0.4 MG SL tablet Place 1 tablet (0.4 mg total) under the tongue every 5 (five) minutes as needed for chest pain. 30 tablet 12   nystatin cream (MYCOSTATIN) Apply 1 application topically at bedtime as needed (irritation).     prasugrel  (EFFIENT ) 10 MG TABS tablet TAKE 1 TABLET BY MOUTH EVERY DAY 90 tablet 1   ranolazine  (RANEXA ) 1000 MG SR tablet TAKE 1 TABLET BY MOUTH TWICE A DAY 180 tablet 1   amLODipine  (NORVASC ) 5 MG tablet TAKE 1 TABLET (5 MG TOTAL) BY MOUTH DAILY. 90 tablet 3   busPIRone  (BUSPAR ) 10 MG tablet Take 10 mg by mouth 2 (two) times daily as needed. (Patient not taking: Reported on 01/02/2024)     carvedilol  (COREG ) 25 MG tablet Take 1 tablet (25 mg total) by mouth 2 (two) times daily. (Patient not taking: Reported on 01/02/2024) 60 tablet 11   Semaglutide -Weight Management (WEGOVY ) 0.25 MG/0.5ML SOAJ Inject 0.25 mg into the skin once a week. (Patient not taking: Reported on 01/02/2024) 2 mL 3   No current facility-administered medications for this visit.    Allergies:   Penicillins, Atorvastatin  calcium , Metoprolol , Penicillins, Repatha  [evolocumab ], and Zetia  [ezetimibe ]    Social History:   reports that she quit smoking about 2 years ago. Her smoking use included cigarettes. She started smoking about 22 years ago. She has a 10 pack-year smoking history. She has never used smokeless tobacco. She reports that she does not currently use alcohol. She reports that she does not use drugs.   Family History:  family history includes Heart attack in her father and mother.    ROS:     Review of Systems  Constitutional: Negative.   HENT: Negative.    Eyes: Negative.   Respiratory: Negative.  Negative for shortness of breath.   Cardiovascular: Negative.  Negative for chest pain.  Gastrointestinal: Negative.  Negative for abdominal pain, constipation and diarrhea.  Genitourinary: Negative.   Musculoskeletal:  Negative for joint pain  and myalgias.  Skin: Negative.   Neurological: Negative.  Negative for dizziness and headaches.  Endo/Heme/Allergies: Negative.   All other systems reviewed and are negative.     All other systems are reviewed and negative.    PHYSICAL EXAM: VS:  BP 119/85   Pulse 75   Ht 5' 11 (1.803 m)   Wt 281 lb (127.5 kg)   SpO2 98%   BMI 39.19 kg/m  , BMI Body mass index is 39.19 kg/m. Last weight:  Wt Readings from Last 3 Encounters:  01/02/24 281 lb (127.5 kg)  12/19/23 283 lb (128.4 kg)  07/26/23 274 lb (124.3 kg)     Physical Exam Vitals and nursing note reviewed.  Constitutional:      Appearance: Normal appearance. She is normal weight.  HENT:     Head: Normocephalic and atraumatic.     Nose: Nose normal.     Mouth/Throat:     Mouth:  Mucous membranes are moist.     Pharynx: Oropharynx is clear.  Eyes:     Conjunctiva/sclera: Conjunctivae normal.     Pupils: Pupils are equal, round, and reactive to light.  Cardiovascular:     Rate and Rhythm: Normal rate and regular rhythm.     Pulses: Normal pulses.     Heart sounds: Normal heart sounds.  Pulmonary:     Effort: Pulmonary effort is normal.     Breath sounds: Normal breath sounds.  Abdominal:     General: Abdomen is flat. Bowel sounds are normal.     Palpations: Abdomen is soft.  Musculoskeletal:        General: Normal range of motion.     Cervical back: Normal range of motion.  Skin:    General: Skin is warm and dry.  Neurological:     General: No focal deficit present.     Mental Status: She is alert and oriented to person, place, and time. Mental status is at baseline.  Psychiatric:        Mood and Affect: Mood normal.        Behavior: Behavior normal.     EKG: none today  Recent Labs: 12/19/2023: ALT 13; BUN 8; Creatinine, Ser 0.90; Hemoglobin 13.9; Platelets 236; Potassium 3.7; Sodium 139; TSH 0.865    Lipid Panel    Component Value Date/Time   CHOL 171 12/19/2023 0958   TRIG 95 12/19/2023 0958    HDL 51 12/19/2023 0958   CHOLHDL 3.4 12/19/2023 0958   CHOLHDL 5.0 04/05/2022 0938   VLDL 12 04/05/2022 0938   LDLCALC 103 (H) 12/19/2023 0958      Other studies Reviewed: none   ASSESSMENT AND PLAN:    ICD-10-CM   1. Coronary artery disease involving native coronary artery of native heart without angina pectoris  I25.10        Problem List Items Addressed This Visit       Cardiovascular and Mediastinum   Coronary artery disease - Primary   Patient returns today for 2 week follow up. Patient reports cheat discomfort is gone, no further pain. Patient does not currently have medication insurance, was unable to get Wegovy . Patient will notify office when Medicaid is re-instated.       Relevant Medications   atorvastatin  (LIPITOR ) 80 MG tablet     Disposition:   Return in about 4 weeks (around 01/30/2024).    Total time spent: 25 minutes  Signed,  [employer], NP  01/02/2024 10:12 AM    [facility]

## 2024-01-09 ENCOUNTER — Other Ambulatory Visit: Payer: Self-pay

## 2024-01-09 ENCOUNTER — Emergency Department: Payer: Self-pay

## 2024-01-09 DIAGNOSIS — I2510 Atherosclerotic heart disease of native coronary artery without angina pectoris: Secondary | ICD-10-CM | POA: Diagnosis present

## 2024-01-09 DIAGNOSIS — Z9851 Tubal ligation status: Secondary | ICD-10-CM

## 2024-01-09 DIAGNOSIS — Z955 Presence of coronary angioplasty implant and graft: Secondary | ICD-10-CM

## 2024-01-09 DIAGNOSIS — Z87891 Personal history of nicotine dependence: Secondary | ICD-10-CM

## 2024-01-09 DIAGNOSIS — K353 Acute appendicitis with localized peritonitis, without perforation or gangrene: Principal | ICD-10-CM | POA: Diagnosis present

## 2024-01-09 DIAGNOSIS — I252 Old myocardial infarction: Secondary | ICD-10-CM

## 2024-01-09 DIAGNOSIS — Z888 Allergy status to other drugs, medicaments and biological substances status: Secondary | ICD-10-CM

## 2024-01-09 DIAGNOSIS — Z8249 Family history of ischemic heart disease and other diseases of the circulatory system: Secondary | ICD-10-CM

## 2024-01-09 DIAGNOSIS — Z880 Allergy status to penicillin: Secondary | ICD-10-CM

## 2024-01-09 DIAGNOSIS — R7303 Prediabetes: Secondary | ICD-10-CM | POA: Diagnosis present

## 2024-01-09 DIAGNOSIS — K219 Gastro-esophageal reflux disease without esophagitis: Secondary | ICD-10-CM | POA: Diagnosis present

## 2024-01-09 DIAGNOSIS — E785 Hyperlipidemia, unspecified: Secondary | ICD-10-CM | POA: Diagnosis present

## 2024-01-09 DIAGNOSIS — Z7985 Long-term (current) use of injectable non-insulin antidiabetic drugs: Secondary | ICD-10-CM

## 2024-01-09 DIAGNOSIS — E669 Obesity, unspecified: Secondary | ICD-10-CM | POA: Diagnosis present

## 2024-01-09 DIAGNOSIS — Z7982 Long term (current) use of aspirin: Secondary | ICD-10-CM

## 2024-01-09 DIAGNOSIS — Z7902 Long term (current) use of antithrombotics/antiplatelets: Secondary | ICD-10-CM

## 2024-01-09 DIAGNOSIS — Z79899 Other long term (current) drug therapy: Secondary | ICD-10-CM

## 2024-01-09 DIAGNOSIS — I10 Essential (primary) hypertension: Secondary | ICD-10-CM | POA: Diagnosis present

## 2024-01-09 DIAGNOSIS — R1032 Left lower quadrant pain: Secondary | ICD-10-CM | POA: Diagnosis not present

## 2024-01-09 LAB — BASIC METABOLIC PANEL WITH GFR
Anion gap: 10 (ref 5–15)
BUN: 14 mg/dL (ref 6–20)
CO2: 22 mmol/L (ref 22–32)
Calcium: 9 mg/dL (ref 8.9–10.3)
Chloride: 105 mmol/L (ref 98–111)
Creatinine, Ser: 1 mg/dL (ref 0.44–1.00)
GFR, Estimated: >60 mL/min (ref >60)
Glucose, Bld: 120 mg/dL — ABNORMAL HIGH (ref 70–99)
Potassium: 3.8 mmol/L (ref 3.5–5.1)
Sodium: 137 mmol/L (ref 135–145)

## 2024-01-09 LAB — CBC
HCT: 34.4 % — ABNORMAL LOW (ref 36.0–46.0)
Hemoglobin: 11 g/dL — ABNORMAL LOW (ref 12.0–15.0)
MCH: 23.9 pg — ABNORMAL LOW (ref 26.0–34.0)
MCHC: 32 g/dL (ref 30.0–36.0)
MCV: 74.6 fL — ABNORMAL LOW (ref 80.0–100.0)
Platelets: 280 K/uL (ref 150–400)
RBC: 4.61 MIL/uL (ref 3.87–5.11)
RDW: 17.3 % — ABNORMAL HIGH (ref 11.5–15.5)
WBC: 12.4 K/uL — ABNORMAL HIGH (ref 4.0–10.5)
nRBC: 0 % (ref 0.0–0.2)

## 2024-01-09 LAB — TROPONIN I (HIGH SENSITIVITY): Troponin I (High Sensitivity): <2 ng/L (ref <18)

## 2024-01-09 NOTE — ED Triage Notes (Addendum)
 Pt reports she developed chest pain that radiates to her left shoulder and lower abd pain, pt reports she also feels nauseous. Pt denies vaginal bleeding. Pt reports she had MI in 2019 and has 4 cardiac stents, pt takes blood thinner

## 2024-01-10 ENCOUNTER — Inpatient Hospital Stay
Admission: EM | Admit: 2024-01-10 | Discharge: 2024-01-11 | DRG: 373 | Disposition: A | Discharge status: Home / Self Care | Payer: Self-pay | Attending: Surgery | Admitting: Surgery

## 2024-01-10 ENCOUNTER — Emergency Department

## 2024-01-10 DIAGNOSIS — Z7982 Long term (current) use of aspirin: Secondary | ICD-10-CM | POA: Diagnosis not present

## 2024-01-10 DIAGNOSIS — I252 Old myocardial infarction: Secondary | ICD-10-CM | POA: Diagnosis not present

## 2024-01-10 DIAGNOSIS — Z880 Allergy status to penicillin: Secondary | ICD-10-CM | POA: Diagnosis not present

## 2024-01-10 DIAGNOSIS — Z79899 Other long term (current) drug therapy: Secondary | ICD-10-CM | POA: Diagnosis not present

## 2024-01-10 DIAGNOSIS — I10 Essential (primary) hypertension: Secondary | ICD-10-CM | POA: Diagnosis present

## 2024-01-10 DIAGNOSIS — E785 Hyperlipidemia, unspecified: Secondary | ICD-10-CM | POA: Diagnosis present

## 2024-01-10 DIAGNOSIS — Z9851 Tubal ligation status: Secondary | ICD-10-CM | POA: Diagnosis not present

## 2024-01-10 DIAGNOSIS — R1032 Left lower quadrant pain: Secondary | ICD-10-CM | POA: Diagnosis present

## 2024-01-10 DIAGNOSIS — R7303 Prediabetes: Secondary | ICD-10-CM | POA: Diagnosis present

## 2024-01-10 DIAGNOSIS — K219 Gastro-esophageal reflux disease without esophagitis: Secondary | ICD-10-CM | POA: Diagnosis present

## 2024-01-10 DIAGNOSIS — Z7985 Long-term (current) use of injectable non-insulin antidiabetic drugs: Secondary | ICD-10-CM | POA: Diagnosis not present

## 2024-01-10 DIAGNOSIS — Z87891 Personal history of nicotine dependence: Secondary | ICD-10-CM | POA: Diagnosis not present

## 2024-01-10 DIAGNOSIS — Z888 Allergy status to other drugs, medicaments and biological substances status: Secondary | ICD-10-CM | POA: Diagnosis not present

## 2024-01-10 DIAGNOSIS — K353 Acute appendicitis with localized peritonitis, without perforation or gangrene: Secondary | ICD-10-CM | POA: Diagnosis present

## 2024-01-10 DIAGNOSIS — Z8249 Family history of ischemic heart disease and other diseases of the circulatory system: Secondary | ICD-10-CM | POA: Diagnosis not present

## 2024-01-10 DIAGNOSIS — K3580 Unspecified acute appendicitis: Principal | ICD-10-CM

## 2024-01-10 DIAGNOSIS — I2510 Atherosclerotic heart disease of native coronary artery without angina pectoris: Secondary | ICD-10-CM | POA: Diagnosis present

## 2024-01-10 DIAGNOSIS — R109 Unspecified abdominal pain: Secondary | ICD-10-CM

## 2024-01-10 DIAGNOSIS — E669 Obesity, unspecified: Secondary | ICD-10-CM | POA: Diagnosis present

## 2024-01-10 DIAGNOSIS — Z7902 Long term (current) use of antithrombotics/antiplatelets: Secondary | ICD-10-CM | POA: Diagnosis not present

## 2024-01-10 DIAGNOSIS — Z955 Presence of coronary angioplasty implant and graft: Secondary | ICD-10-CM | POA: Diagnosis not present

## 2024-01-10 HISTORY — DX: Acute myocardial infarction, unspecified: I21.9

## 2024-01-10 LAB — URINALYSIS, W/ REFLEX TO CULTURE (INFECTION SUSPECTED)
Bilirubin Urine: NEGATIVE
Glucose, UA: NEGATIVE mg/dL
Hgb urine dipstick: NEGATIVE
Ketones, ur: NEGATIVE mg/dL
Leukocytes,Ua: NEGATIVE
Nitrite: NEGATIVE
Protein, ur: NEGATIVE mg/dL
RBC / HPF: 0 RBC/hpf (ref 0–5)
Specific Gravity, Urine: 1.01 (ref 1.005–1.030)
pH: 5 (ref 5.0–8.0)

## 2024-01-10 LAB — HCG, QUANTITATIVE, PREGNANCY: hCG, Beta Chain, Quant, S: 1 m[IU]/mL (ref <5)

## 2024-01-10 LAB — TROPONIN I (HIGH SENSITIVITY): Troponin I (High Sensitivity): 2 ng/L (ref <18)

## 2024-01-10 MED ORDER — ENOXAPARIN SODIUM 80 MG/0.8ML IJ SOSY
0.5000 mg/kg | PREFILLED_SYRINGE | INTRAMUSCULAR | Status: DC
  Administered 2024-01-10: 65 mg via SUBCUTANEOUS
  Filled 2024-01-10: qty 0.65

## 2024-01-10 MED ORDER — HYDROCODONE-ACETAMINOPHEN 5-325 MG PO TABS
1.0000 | ORAL_TABLET | Freq: Three times a day (TID) | ORAL | Status: DC | PRN
  Administered 2024-01-10 – 2024-01-11 (×3): 1 via ORAL
  Filled 2024-01-10 (×3): qty 1

## 2024-01-10 MED ORDER — NITROGLYCERIN 0.4 MG SL SUBL: 0.4000 mg | SUBLINGUAL_TABLET | SUBLINGUAL | Status: DC | PRN

## 2024-01-10 MED ORDER — OXYCODONE HCL 5 MG PO TABS
5.0000 mg | ORAL_TABLET | Freq: Once | ORAL | Status: AC
  Administered 2024-01-10: 5 mg via ORAL
  Filled 2024-01-10: qty 1

## 2024-01-10 MED ORDER — CARVEDILOL 12.5 MG PO TABS
12.5000 mg | ORAL_TABLET | Freq: Two times a day (BID) | ORAL | Status: DC
  Administered 2024-01-10 (×2): 12.5 mg via ORAL
  Filled 2024-01-10 (×2): qty 1
  Filled 2024-01-10: qty 2

## 2024-01-10 MED ORDER — ASPIRIN 81 MG PO CHEW
81.0000 mg | CHEWABLE_TABLET | Freq: Every day | ORAL | Status: DC
  Administered 2024-01-10 – 2024-01-11 (×2): 81 mg via ORAL
  Filled 2024-01-10 (×2): qty 1

## 2024-01-10 MED ORDER — SODIUM CHLORIDE 0.9 % IV BOLUS
1000.0000 mL | Freq: Once | INTRAVENOUS | Status: AC
  Administered 2024-01-10: 1000 mL via INTRAVENOUS

## 2024-01-10 MED ORDER — ONDANSETRON 4 MG PO TBDP
4.0000 mg | ORAL_TABLET | Freq: Once | ORAL | Status: AC
  Administered 2024-01-10: 4 mg via ORAL
  Filled 2024-01-10: qty 1

## 2024-01-10 MED ORDER — BUSPIRONE HCL 10 MG PO TABS: 10.0000 mg | ORAL_TABLET | Freq: Two times a day (BID) | ORAL | Status: DC | PRN

## 2024-01-10 MED ORDER — ISOSORBIDE MONONITRATE ER 30 MG PO TB24
120.0000 mg | ORAL_TABLET | Freq: Two times a day (BID) | ORAL | Status: DC
  Administered 2024-01-10 (×2): 120 mg via ORAL
  Filled 2024-01-10 (×2): qty 4
  Filled 2024-01-10: qty 2

## 2024-01-10 MED ORDER — MORPHINE SULFATE (PF) 2 MG/ML IV SOLN
2.0000 mg | INTRAVENOUS | Status: DC | PRN
  Administered 2024-01-10: 2 mg via INTRAVENOUS
  Filled 2024-01-10 (×2): qty 1

## 2024-01-10 MED ORDER — RANOLAZINE ER 500 MG PO TB12
1000.0000 mg | ORAL_TABLET | Freq: Two times a day (BID) | ORAL | Status: DC
  Administered 2024-01-10 – 2024-01-11 (×3): 1000 mg via ORAL
  Filled 2024-01-10 (×3): qty 2

## 2024-01-10 MED ORDER — METRONIDAZOLE 500 MG/100ML IV SOLN
500.0000 mg | Freq: Two times a day (BID) | INTRAVENOUS | Status: DC
  Administered 2024-01-10 – 2024-01-11 (×2): 500 mg via INTRAVENOUS
  Filled 2024-01-10 (×2): qty 100

## 2024-01-10 MED ORDER — DOCUSATE SODIUM 100 MG PO CAPS: 100.0000 mg | ORAL_CAPSULE | Freq: Two times a day (BID) | ORAL | Status: DC | PRN

## 2024-01-10 MED ORDER — FAMOTIDINE 20 MG PO TABS
20.0000 mg | ORAL_TABLET | Freq: Two times a day (BID) | ORAL | Status: DC
  Administered 2024-01-10 – 2024-01-11 (×3): 20 mg via ORAL
  Filled 2024-01-10 (×3): qty 1

## 2024-01-10 MED ORDER — SODIUM CHLORIDE 0.9 % IV SOLN
2.0000 g | INTRAVENOUS | Status: DC
  Administered 2024-01-11: 2 g via INTRAVENOUS
  Filled 2024-01-10: qty 20

## 2024-01-10 MED ORDER — ONDANSETRON 4 MG PO TBDP: 4.0000 mg | ORAL_TABLET | Freq: Four times a day (QID) | ORAL | Status: DC | PRN

## 2024-01-10 MED ORDER — TRAMADOL HCL 50 MG PO TABS
50.0000 mg | ORAL_TABLET | Freq: Four times a day (QID) | ORAL | Status: DC | PRN
  Administered 2024-01-10: 50 mg via ORAL
  Filled 2024-01-10: qty 1

## 2024-01-10 MED ORDER — ATORVASTATIN CALCIUM 20 MG PO TABS
80.0000 mg | ORAL_TABLET | Freq: Every day | ORAL | Status: DC
  Administered 2024-01-10 – 2024-01-11 (×2): 80 mg via ORAL
  Filled 2024-01-10 (×2): qty 4

## 2024-01-10 MED ORDER — ACETAMINOPHEN 500 MG PO TABS
1000.0000 mg | ORAL_TABLET | Freq: Once | ORAL | Status: AC
  Administered 2024-01-10: 1000 mg via ORAL
  Filled 2024-01-10: qty 2

## 2024-01-10 MED ORDER — SODIUM CHLORIDE 0.9 % IV SOLN
1.0000 g | Freq: Once | INTRAVENOUS | Status: AC
  Administered 2024-01-10: 1 g via INTRAVENOUS
  Filled 2024-01-10: qty 10

## 2024-01-10 MED ORDER — IBUPROFEN 600 MG PO TABS
600.0000 mg | ORAL_TABLET | Freq: Once | ORAL | Status: AC
  Administered 2024-01-10: 600 mg via ORAL
  Filled 2024-01-10: qty 1

## 2024-01-10 MED ORDER — ONDANSETRON HCL 4 MG/2ML IJ SOLN: 4.0000 mg | Freq: Four times a day (QID) | INTRAMUSCULAR | Status: DC | PRN

## 2024-01-10 MED ORDER — ACETAMINOPHEN 325 MG PO TABS
650.0000 mg | ORAL_TABLET | Freq: Three times a day (TID) | ORAL | Status: DC | PRN
Start: 2024-01-10 — End: 2024-01-11

## 2024-01-10 MED ORDER — AMLODIPINE BESYLATE 5 MG PO TABS
5.0000 mg | ORAL_TABLET | Freq: Every day | ORAL | Status: DC
  Administered 2024-01-10: 5 mg via ORAL
  Filled 2024-01-10 (×2): qty 1

## 2024-01-10 MED ORDER — METRONIDAZOLE 500 MG/100ML IV SOLN
500.0000 mg | Freq: Once | INTRAVENOUS | Status: AC
  Administered 2024-01-10: 500 mg via INTRAVENOUS
  Filled 2024-01-10: qty 100

## 2024-01-10 NOTE — ED Notes (Signed)
 Pt back from CT

## 2024-01-10 NOTE — H&P (View-Only) (Signed)
 [facility]- General Surgery SURGICAL HISTORY & PHYSICAL   HISTORY OF PRESENT ILLNESS (HPI):  43 y.o. female presented to [facility] ED last night for abdominal pain. States the pain has been intermittent for several months but pain intensified these last few days and has been more persistent. Pain starts on left flank and radiates to lower abdomen. Has not noticed any aggravating factors. Has not tried anything at home to alleviate symptoms. Denies any nausea or vomiting associated with pain. Has a prior surgical history of tubal ligation. No other abdominal surgeries.   In the ED, patient was hypertensive with a BP of 141/46, HR of 89, afebrile, and RR of 18. Labs showed leukocytosis with WBC of 12.4 and Hbg of 11.0  BMP was ordered. Creatinine levels were stable at 1.0 and no electrolytes abnormalities were shown. With left flank pain, the ED wanted to rule out renal colic and a CT renal stone study was ordered. Imaging showed periappendiceal inflammatory stranding consistent with acute appendicitis. No extraluminal gas or abscess.   During our encounter, patient was stable, alert and orientated stating abdominal/flank pain was controlled with pain medication.   PAST MEDICAL HISTORY (PMH):  Past Medical History:  Diagnosis Date   Coronary artery disease    GERD (gastroesophageal reflux disease)    H/O heart artery stent    Hyperlipidemia    Hypertension    Myocardial infarction (HCC)    Tobacco abuse     Reviewed. Otherwise negative.   PAST SURGICAL HISTORY (PSH):  Past Surgical History:  Procedure Laterality Date   CORONARY STENT INTERVENTION N/A 06/10/2018   Procedure: CORONARY STENT INTERVENTION;  Surgeon: Florencio Cara BIRCH, MD;  Location: [facility] INVASIVE CV LAB;  Service: Cardiovascular;  Laterality: N/A;   CORONARY STENT INTERVENTION N/A 04/14/2021   Procedure: CORONARY STENT INTERVENTION;  Surgeon: Ammon Blunt, MD;  Location: [facility] INVASIVE CV LAB;  Service: Cardiovascular;   Laterality: N/A;   CORONARY STENT INTERVENTION N/A 01/20/2022   Procedure: CORONARY STENT INTERVENTION;  Surgeon: Ammon Blunt, MD;  Location: [facility] INVASIVE CV LAB;  Service: Cardiovascular;  Laterality: N/A;   CORONARY/GRAFT ACUTE MI REVASCULARIZATION N/A 10/11/2021   Procedure: Coronary/Graft Acute MI Revascularization;  Surgeon: Mady Bruckner, MD;  Location: [facility] INVASIVE CV LAB;  Service: Cardiovascular;  Laterality: N/A;   LEFT HEART CATH AND CORONARY ANGIOGRAPHY N/A 06/10/2018   Procedure: With coronary intervention and stenting;  Surgeon: Fernand Denyse LABOR, MD;  Location: [facility] INVASIVE CV LAB;  Service: Cardiovascular;  Laterality: N/A;   LEFT HEART CATH AND CORONARY ANGIOGRAPHY Left 06/15/2020   Procedure: LEFT HEART CATH AND CORONARY ANGIOGRAPHY;  Surgeon: Fernand Denyse LABOR, MD;  Location: [facility] INVASIVE CV LAB;  Service: Cardiovascular;  Laterality: Left;   LEFT HEART CATH AND CORONARY ANGIOGRAPHY N/A 04/14/2021   Procedure: LEFT HEART CATH AND CORONARY ANGIOGRAPHY with intervention;  Surgeon: Fernand Denyse LABOR, MD;  Location: [facility] INVASIVE CV LAB;  Service: Cardiovascular;  Laterality: N/A;   LEFT HEART CATH AND CORONARY ANGIOGRAPHY N/A 10/11/2021   Procedure: LEFT HEART CATH AND CORONARY ANGIOGRAPHY;  Surgeon: Mady Bruckner, MD;  Location: [facility] INVASIVE CV LAB;  Service: Cardiovascular;  Laterality: N/A;   LEFT HEART CATH AND CORONARY ANGIOGRAPHY N/A 01/20/2022   Procedure: LEFT HEART CATH AND CORONARY ANGIOGRAPHY;  Surgeon: Fernand Denyse LABOR, MD;  Location: [facility] INVASIVE CV LAB;  Service: Cardiovascular;  Laterality: N/A;   LEFT HEART CATH AND CORONARY ANGIOGRAPHY N/A 04/05/2022   Procedure: LEFT HEART CATH AND CORONARY ANGIOGRAPHY;  Surgeon: Dann Candyce RAMAN,  MD;  Location: [facility] INVASIVE CV LAB;  Service: Cardiovascular;  Laterality: N/A;   TUBAL LIGATION      Reviewed. Otherwise negative.   MEDICATIONS:  Prior to Admission medications   Medication Sig Start Date End Date  Taking? Authorizing Provider  amLODipine  (NORVASC ) 5 MG tablet TAKE 1 TABLET (5 MG TOTAL) BY MOUTH DAILY. 12/17/23 12/16/24 Yes Scoggins, Amber, NP  aspirin  (ASPIRIN  CHILDRENS) 81 MG chewable tablet Chew 1 tablet (81 mg total) by mouth daily. 06/11/18  Yes Wieting, Richard, MD  atorvastatin  (LIPITOR ) 80 MG tablet Take 1 tablet (80 mg total) by mouth daily. 01/02/24 01/01/25 Yes Scoggins, Amber, NP  Bacillus Coagulans-Inulin (PROBIOTIC) 1-250 BILLION-MG CAPS Take 1 capsule by mouth daily. 11/05/20  Yes Ward, Josette SAILOR, DO  busPIRone  (BUSPAR ) 10 MG tablet Take 10 mg by mouth 2 (two) times daily as needed. 11/18/21  Yes [provider]  carvedilol  (COREG ) 12.5 MG tablet TAKE 1 TABLET BY MOUTH TWICE A DAY 08/13/23  Yes Fernand Denyse LABOR, MD  Cholecalciferol  (VITAMIN D ) 125 MCG (5000 UT) CAPS Take 5,000 Units by mouth daily.   Yes [provider]  Cyclobenzaprine HCl (FLEXERIL PO) Take 5 mg by mouth. prn   Yes [provider]  famotidine  (PEPCID ) 20 MG tablet Take 1 tablet (20 mg total) by mouth 2 (two) times daily. 12/19/23  Yes Scoggins, Hospital doctor, NP  isosorbide  mononitrate (IMDUR ) 120 MG 24 hr tablet TAKE 1 TABLET BY MOUTH TWICE A DAY Patient taking differently: Take 120 mg by mouth 2 (two) times daily. Takes half a pill twice a day 10/16/22  Yes Fernand Denyse A, MD  nitroGLYCERIN  (NITROSTAT ) 0.4 MG SL tablet Place 1 tablet (0.4 mg total) under the tongue every 5 (five) minutes as needed for chest pain. 04/15/21  Yes Patel, Sona, MD  nystatin cream (MYCOSTATIN) Apply 1 application topically at bedtime as needed (irritation). 04/01/21  Yes [provider]  prasugrel  (EFFIENT ) 10 MG TABS tablet TAKE 1 TABLET BY MOUTH EVERY DAY 09/15/23  Yes Fernand Denyse LABOR, MD  ranolazine  (RANEXA ) 1000 MG SR tablet TAKE 1 TABLET BY MOUTH TWICE A DAY 11/05/23  Yes Scoggins, Amber, NP  carvedilol  (COREG ) 25 MG tablet Take 1 tablet (25 mg total) by mouth 2 (two) times daily. Patient not taking: No sig  reported 02/08/23 02/08/24  Fernand Denyse LABOR, MD  Evolocumab  140 MG/ML SOAJ Inject 140 mg into the skin every 14 (fourteen) days. 12/13/22   Scoggins, Amber, NP  Semaglutide -Weight Management (WEGOVY ) 0.25 MG/0.5ML SOAJ Inject 0.25 mg into the skin once a week. Patient not taking: No sig reported 12/19/23   Scoggins, [facility], NP     ALLERGIES:  Allergies  Allergen Reactions   Penicillins Hives   Atorvastatin  Calcium  Other (See Comments)    myalgias   Metoprolol  Hives   Penicillins Other (See Comments)   Repatha  [Evolocumab ]    Zetia  [Ezetimibe ] Rash     SOCIAL HISTORY:  Social History   Socioeconomic History   Marital status: Single    Spouse name: Not on file   Number of children: 4   Years of education: Not on file   Highest education level: Not on file  Occupational History   Not on file  Tobacco Use   Smoking status: Former    Current packs/day: 0.00    Average packs/day: 0.5 packs/day for 20.0 years (10.0 ttl pk-yrs)    Types: Cigarettes    Start date: 03/2001    Quit date: 03/2021  Years since quitting: 2.7   Smokeless tobacco: Never  Vaping Use   Vaping status: Former  Substance and Sexual Activity   Alcohol use: Not Currently    Comment: occasionally   Drug use: Never   Sexual activity: Yes  Other Topics Concern   Not on file  Social History Narrative   ** Merged History Encounter **       Lives at home with all her children   Social Drivers of Corporate investment banker Strain: Not on file  Food Insecurity: No Food Insecurity (01/10/2024)   Hunger Vital Sign    Worried About Running Out of Food in the Last Year: Never true    Ran Out of Food in the Last Year: Never true  Transportation Needs: No Transportation Needs (01/10/2024)   PRAPARE - Administrator, Civil Service (Medical): No    Lack of Transportation (Non-Medical): No  Physical Activity: Not on file  Stress: Not on file  Social Connections: Not on file  Intimate Partner  Violence: Unknown (01/10/2024)   Humiliation, Afraid, Rape, and Kick questionnaire    Fear of Current or Ex-Partner: No    Emotionally Abused: No    Physically Abused: No    Sexually Abused: Not on file     FAMILY HISTORY:  Family History  Problem Relation Age of Onset   Heart attack Mother    Heart attack Father     Otherwise negative.   REVIEW OF SYSTEMS:  Review of Systems  Constitutional:  Negative for chills and fever.  Respiratory:  Negative for shortness of breath and wheezing.   Cardiovascular:  Negative for chest pain and palpitations.  Gastrointestinal:  Positive for abdominal pain. Negative for blood in stool, melena, nausea and vomiting.  Genitourinary:  Positive for flank pain.    VITAL SIGNS:  Temp:  [97.5 F (36.4 C)-98.7 F (37.1 C)] 97.5 F (36.4 C) (07/17 1419) Pulse Rate:  [58-89] 68 (07/17 1419) Resp:  [10-22] 18 (07/17 1419) BP: (104-146)/(51-96) 113/72 (07/17 1419) SpO2:  [97 %-100 %] 100 % (07/17 1419) Weight:  [127.5 kg] 127.5 kg (07/17 1100)     Height: 5' 10.98 (180.3 cm) Weight: 127.5 kg BMI (Calculated): 39.22   PHYSICAL EXAM:  Physical Exam Constitutional:      Appearance: She is well-developed.  Eyes:     Extraocular Movements: Extraocular movements intact.     Pupils: Pupils are equal, round, and reactive to light.  Cardiovascular:     Rate and Rhythm: Normal rate and regular rhythm.     Heart sounds: Normal heart sounds.  Pulmonary:     Effort: Pulmonary effort is normal.     Breath sounds: Normal breath sounds.  Abdominal:     Palpations: Abdomen is soft.     Tenderness: There is abdominal tenderness. There is no guarding or rebound.  Neurological:     Mental Status: She is alert.     INTAKE/OUTPUT:  This shift: Total I/O In: 1197.1 [IV Piggyback:1197.1] Out: -   Last 2 shifts: @IOLAST2SHIFTS @  Labs:     Latest Ref Rng & Units 01/09/2024   11:20 PM 12/19/2023    9:58 AM 06/27/2023    8:32 PM  CBC  WBC 4.0 - 10.5 K/uL  12.4  9.5  6.7   Hemoglobin 12.0 - 15.0 g/dL 88.9  86.0  88.8   Hematocrit 36.0 - 46.0 % 34.4  45.9  34.5   Platelets 150 - 400 K/uL 280  236  266       Latest Ref Rng & Units 01/09/2024   11:20 PM 12/19/2023    9:58 AM 06/27/2023    8:32 PM  CMP  Glucose 70 - 99 mg/dL 879  885  97   BUN 6 - 20 mg/dL 14  8  11    Creatinine 0.44 - 1.00 mg/dL 8.99  9.09  9.02   Sodium 135 - 145 mmol/L 137  139  137   Potassium 3.5 - 5.1 mmol/L 3.8  3.7  3.5   Chloride 98 - 111 mmol/L 105  107  105   CO2 22 - 32 mmol/L 22  18  23    Calcium  8.9 - 10.3 mg/dL 9.0  8.8  8.7   Total Protein 6.0 - 8.5 g/dL  6.2    Total Bilirubin 0.0 - 1.2 mg/dL  0.2    Alkaline Phos 44 - 121 IU/L  68    AST 0 - 40 IU/L  15    ALT 0 - 32 IU/L  13      Imaging studies:   CLINICAL DATA:  Abdominal and flank pain   EXAM: 01/10/24 CT ABDOMEN AND PELVIS WITHOUT CONTRAST   TECHNIQUE: Multidetector CT imaging of the abdomen and pelvis was performed following the standard protocol without IV contrast.   RADIATION DOSE REDUCTION: This exam was performed according to the departmental dose-optimization program which includes automated exposure control, adjustment of the mA and/or kV according to patient size and/or use of iterative reconstruction technique.   COMPARISON:  07/21/2021   FINDINGS: Lower chest: Coronary atherosclerotic vascular disease. Lingular subsegmental atelectasis or scarring, image 106 series 6.   Hepatobiliary: Stable sub-centimeter hypodense hepatic lesions favoring cysts. No further imaging workup of these lesions is indicated.   Gallbladder appears unremarkable.   Pancreas: Unremarkable   Spleen: Unremarkable   Adrenals/Urinary Tract: Chronic capsular thickening along the right kidney posteriorly with some faint associated calcification on image 49 series 2. This could be the result of old trauma or remote inflammation but is technically nonspecific. No urinary tract calculi observed.  Adrenal glands unremarkable.   Stomach/Bowel: Acute appendicitis is present with the appendix measuring up to 0.8 cm in diameter, and with periappendiceal inflammatory stranding as on image 42 series 6. No extraluminal gas or abscess. The appendix extends cephalad and posterior from the cecum.   Vascular/Lymphatic: Atherosclerosis is present, including aortoiliac atherosclerotic disease.   Reproductive: Unremarkable.  Incidental mildly retroverted uterus.   Other: No supplemental non-categorized findings.   Musculoskeletal: Mild sclerosis along the iliac sides of the sacroiliac joints, potentially related to arthropathy or mild osteitis condensans ilii. Degenerative disc disease observed at L4-5 and L5-S1.   IMPRESSION: 1. Acute appendicitis with appendiceal wall thickening and surrounding inflammatory stranding. No findings of extraluminal gas or abscess to suggest rupture. 2. Coronary atherosclerotic vascular disease. 3. Chronic capsular thickening along the right kidney posteriorly with some faint associated calcification. This could be the result of old trauma or remote inflammation but is technically nonspecific. 4. 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 5. Mild sclerosis along the iliac sides of the sacroiliac joints, potentially related to arthropathy or mild osteitis condensans ilii. 6.  Aortic Atherosclerosis (ICD10-I70.0).     Electronically Signed   By: Ryan Salvage M.D.   On: 01/10/2024 09:15  Assessment/Plan:  43 y.o. female with acute appendicitis, complicated by pertinent comorbidities including CAD, HTN, prediabetes, obesity, hyperlipidemia and GERD.     - Discuss results with patient and the  recommendation of robotic assisted laparoscopic appendectomy.  - Patient has an extensive cardiac history with coronary artery disease. She is currently on a couple blood thinners, Aspirin  81mg  and Prasugrel  10 Mg. Spoke with Dr. Fernand who is her outpatient  cardiologist with [facility] for cardiac clearance per Dr. Tye. Although this is not a complicated case of acute appendicitis, surgery is recommended to prevent reoccurrence or future complications. Dr. Fernand recommended to keep her on Aspirin  and hold Prasugrel  for at least 4-5 days before surgery since it's not an emergent case. He did not have other concerns about proceeding with surgery. Patient states her last dose of Prasugrel  was yesterday morning. Spoke with Dr. Tye. Planning for surgery Monday, 01/14/24. - Continue ceftriaxone  and flagyl  IV  - Follow labs in the morning, consider discharging if patient is stable with antibiotics outpatient and scheduling surgery outpatient - Continue pain management     - DVT prophylaxis  -- Jessilyn Catino Barrientos PA-C

## 2024-01-10 NOTE — ED Provider Notes (Signed)
-----------------------------------------   7:17 AM on 01/10/2024 -----------------------------------------  Blood pressure 126/78, pulse 72, temperature 98.7 F (37.1 C), temperature source Oral, resp. rate 15, SpO2 100%.  Assuming care from Dr. Cyrena.  In short, Sonya [name] is a 43 y.o. female with a chief complaint of Chest Pain .  Refer to the original H&P for additional details.  The current plan of care is to follow CT, UA, here for flank pain instead of chest pain.  Independent review of CT imaging, shows acute appendicitis.  Consulted surgery who will evaluate the patient.  Will make her n.p.o., will start her on IV ceftriaxone  and Flagyl .  Will give her some fluids as well.  Surgery evaluated patient and admitted her.       Waymond Lorelle Cummins, MD 01/10/24 1130

## 2024-01-10 NOTE — H&P (Signed)
 [facility]- General Surgery SURGICAL HISTORY & PHYSICAL   HISTORY OF PRESENT ILLNESS (HPI):  44 y.o. female presented to [facility] ED last night for abdominal pain. States the pain has been intermittent for several months but pain intensified these last few days and has been more persistent. Pain starts on left flank and radiates to lower abdomen. Has not noticed any aggravating factors. Has not tried anything at home to alleviate symptoms. Denies any nausea or vomiting associated with pain. Has a prior surgical history of tubal ligation. No other abdominal surgeries.   In the ED, patient was hypertensive with a BP of 141/46, HR of 89, afebrile, and RR of 18. Labs showed leukocytosis with WBC of 12.4 and Hbg of 11.0  BMP was ordered. Creatinine levels were stable at 1.0 and no electrolytes abnormalities were shown. With left flank pain, the ED wanted to rule out renal colic and a CT renal stone study was ordered. Imaging showed periappendiceal inflammatory stranding consistent with acute appendicitis. No extraluminal gas or abscess.   During our encounter, patient was stable, alert and orientated stating abdominal/flank pain was controlled with pain medication.   PAST MEDICAL HISTORY (PMH):  Past Medical History:  Diagnosis Date   Coronary artery disease    GERD (gastroesophageal reflux disease)    H/O heart artery stent    Hyperlipidemia    Hypertension    Myocardial infarction (HCC)    Tobacco abuse     Reviewed. Otherwise negative.   PAST SURGICAL HISTORY (PSH):  Past Surgical History:  Procedure Laterality Date   CORONARY STENT INTERVENTION N/A 06/10/2018   Procedure: CORONARY STENT INTERVENTION;  Surgeon: Florencio Cara BIRCH, MD;  Location: [facility] INVASIVE CV LAB;  Service: Cardiovascular;  Laterality: N/A;   CORONARY STENT INTERVENTION N/A 04/14/2021   Procedure: CORONARY STENT INTERVENTION;  Surgeon: Ammon Blunt, MD;  Location: [facility] INVASIVE CV LAB;  Service: Cardiovascular;   Laterality: N/A;   CORONARY STENT INTERVENTION N/A 01/20/2022   Procedure: CORONARY STENT INTERVENTION;  Surgeon: Ammon Blunt, MD;  Location: [facility] INVASIVE CV LAB;  Service: Cardiovascular;  Laterality: N/A;   CORONARY/GRAFT ACUTE MI REVASCULARIZATION N/A 10/11/2021   Procedure: Coronary/Graft Acute MI Revascularization;  Surgeon: Mady Bruckner, MD;  Location: [facility] INVASIVE CV LAB;  Service: Cardiovascular;  Laterality: N/A;   LEFT HEART CATH AND CORONARY ANGIOGRAPHY N/A 06/10/2018   Procedure: With coronary intervention and stenting;  Surgeon: Fernand Denyse LABOR, MD;  Location: [facility] INVASIVE CV LAB;  Service: Cardiovascular;  Laterality: N/A;   LEFT HEART CATH AND CORONARY ANGIOGRAPHY Left 06/15/2020   Procedure: LEFT HEART CATH AND CORONARY ANGIOGRAPHY;  Surgeon: Fernand Denyse LABOR, MD;  Location: [facility] INVASIVE CV LAB;  Service: Cardiovascular;  Laterality: Left;   LEFT HEART CATH AND CORONARY ANGIOGRAPHY N/A 04/14/2021   Procedure: LEFT HEART CATH AND CORONARY ANGIOGRAPHY with intervention;  Surgeon: Fernand Denyse LABOR, MD;  Location: [facility] INVASIVE CV LAB;  Service: Cardiovascular;  Laterality: N/A;   LEFT HEART CATH AND CORONARY ANGIOGRAPHY N/A 10/11/2021   Procedure: LEFT HEART CATH AND CORONARY ANGIOGRAPHY;  Surgeon: Mady Bruckner, MD;  Location: [facility] INVASIVE CV LAB;  Service: Cardiovascular;  Laterality: N/A;   LEFT HEART CATH AND CORONARY ANGIOGRAPHY N/A 01/20/2022   Procedure: LEFT HEART CATH AND CORONARY ANGIOGRAPHY;  Surgeon: Fernand Denyse LABOR, MD;  Location: [facility] INVASIVE CV LAB;  Service: Cardiovascular;  Laterality: N/A;   LEFT HEART CATH AND CORONARY ANGIOGRAPHY N/A 04/05/2022   Procedure: LEFT HEART CATH AND CORONARY ANGIOGRAPHY;  Surgeon: Dann Candyce RAMAN,  MD;  Location: [facility] INVASIVE CV LAB;  Service: Cardiovascular;  Laterality: N/A;   TUBAL LIGATION      Reviewed. Otherwise negative.   MEDICATIONS:  Prior to Admission medications   Medication Sig Start Date End Date  Taking? Authorizing Provider  amLODipine  (NORVASC ) 5 MG tablet TAKE 1 TABLET (5 MG TOTAL) BY MOUTH DAILY. 12/17/23 12/16/24 Yes Scoggins, Amber, NP  aspirin  (ASPIRIN  CHILDRENS) 81 MG chewable tablet Chew 1 tablet (81 mg total) by mouth daily. 06/11/18  Yes Wieting, Richard, MD  atorvastatin  (LIPITOR ) 80 MG tablet Take 1 tablet (80 mg total) by mouth daily. 01/02/24 01/01/25 Yes Scoggins, Amber, NP  Bacillus Coagulans-Inulin (PROBIOTIC) 1-250 BILLION-MG CAPS Take 1 capsule by mouth daily. 11/05/20  Yes Ward, Josette SAILOR, DO  busPIRone  (BUSPAR ) 10 MG tablet Take 10 mg by mouth 2 (two) times daily as needed. 11/18/21  Yes [provider]  carvedilol  (COREG ) 12.5 MG tablet TAKE 1 TABLET BY MOUTH TWICE A DAY 08/13/23  Yes Fernand Denyse LABOR, MD  Cholecalciferol  (VITAMIN D ) 125 MCG (5000 UT) CAPS Take 5,000 Units by mouth daily.   Yes [provider]  Cyclobenzaprine HCl (FLEXERIL PO) Take 5 mg by mouth. prn   Yes [provider]  famotidine  (PEPCID ) 20 MG tablet Take 1 tablet (20 mg total) by mouth 2 (two) times daily. 12/19/23  Yes Scoggins, Hospital doctor, NP  isosorbide  mononitrate (IMDUR ) 120 MG 24 hr tablet TAKE 1 TABLET BY MOUTH TWICE A DAY Patient taking differently: Take 120 mg by mouth 2 (two) times daily. Takes half a pill twice a day 10/16/22  Yes Fernand Denyse A, MD  nitroGLYCERIN  (NITROSTAT ) 0.4 MG SL tablet Place 1 tablet (0.4 mg total) under the tongue every 5 (five) minutes as needed for chest pain. 04/15/21  Yes Patel, Sona, MD  nystatin cream (MYCOSTATIN) Apply 1 application topically at bedtime as needed (irritation). 04/01/21  Yes [provider]  prasugrel  (EFFIENT ) 10 MG TABS tablet TAKE 1 TABLET BY MOUTH EVERY DAY 09/15/23  Yes Fernand Denyse LABOR, MD  ranolazine  (RANEXA ) 1000 MG SR tablet TAKE 1 TABLET BY MOUTH TWICE A DAY 11/05/23  Yes Scoggins, Amber, NP  carvedilol  (COREG ) 25 MG tablet Take 1 tablet (25 mg total) by mouth 2 (two) times daily. Patient not taking: No sig  reported 02/08/23 02/08/24  Fernand Denyse LABOR, MD  Evolocumab  140 MG/ML SOAJ Inject 140 mg into the skin every 14 (fourteen) days. 12/13/22   Scoggins, Amber, NP  Semaglutide -Weight Management (WEGOVY ) 0.25 MG/0.5ML SOAJ Inject 0.25 mg into the skin once a week. Patient not taking: No sig reported 12/19/23   Scoggins, [facility], NP     ALLERGIES:  Allergies  Allergen Reactions   Penicillins Hives   Atorvastatin  Calcium  Other (See Comments)    myalgias   Metoprolol  Hives   Penicillins Other (See Comments)   Repatha  [Evolocumab ]    Zetia  [Ezetimibe ] Rash     SOCIAL HISTORY:  Social History   Socioeconomic History   Marital status: Single    Spouse name: Not on file   Number of children: 4   Years of education: Not on file   Highest education level: Not on file  Occupational History   Not on file  Tobacco Use   Smoking status: Former    Current packs/day: 0.00    Average packs/day: 0.5 packs/day for 20.0 years (10.0 ttl pk-yrs)    Types: Cigarettes    Start date: 03/2001    Quit date: 03/2021  Years since quitting: 2.7   Smokeless tobacco: Never  Vaping Use   Vaping status: Former  Substance and Sexual Activity   Alcohol use: Not Currently    Comment: occasionally   Drug use: Never   Sexual activity: Yes  Other Topics Concern   Not on file  Social History Narrative   ** Merged History Encounter **       Lives at home with all her children   Social Drivers of Corporate investment banker Strain: Not on file  Food Insecurity: No Food Insecurity (01/10/2024)   Hunger Vital Sign    Worried About Running Out of Food in the Last Year: Never true    Ran Out of Food in the Last Year: Never true  Transportation Needs: No Transportation Needs (01/10/2024)   PRAPARE - Administrator, Civil Service (Medical): No    Lack of Transportation (Non-Medical): No  Physical Activity: Not on file  Stress: Not on file  Social Connections: Not on file  Intimate Partner  Violence: Unknown (01/10/2024)   Humiliation, Afraid, Rape, and Kick questionnaire    Fear of Current or Ex-Partner: No    Emotionally Abused: No    Physically Abused: No    Sexually Abused: Not on file     FAMILY HISTORY:  Family History  Problem Relation Age of Onset   Heart attack Mother    Heart attack Father     Otherwise negative.   REVIEW OF SYSTEMS:  Review of Systems  Constitutional:  Negative for chills and fever.  Respiratory:  Negative for shortness of breath and wheezing.   Cardiovascular:  Negative for chest pain and palpitations.  Gastrointestinal:  Positive for abdominal pain. Negative for blood in stool, melena, nausea and vomiting.  Genitourinary:  Positive for flank pain.    VITAL SIGNS:  Temp:  [97.5 F (36.4 C)-98.7 F (37.1 C)] 97.5 F (36.4 C) (07/17 1419) Pulse Rate:  [58-89] 68 (07/17 1419) Resp:  [10-22] 18 (07/17 1419) BP: (104-146)/(51-96) 113/72 (07/17 1419) SpO2:  [97 %-100 %] 100 % (07/17 1419) Weight:  [127.5 kg] 127.5 kg (07/17 1100)     Height: 5' 10.98 (180.3 cm) Weight: 127.5 kg BMI (Calculated): 39.22   PHYSICAL EXAM:  Physical Exam Constitutional:      Appearance: She is well-developed.  Eyes:     Extraocular Movements: Extraocular movements intact.     Pupils: Pupils are equal, round, and reactive to light.  Cardiovascular:     Rate and Rhythm: Normal rate and regular rhythm.     Heart sounds: Normal heart sounds.  Pulmonary:     Effort: Pulmonary effort is normal.     Breath sounds: Normal breath sounds.  Abdominal:     Palpations: Abdomen is soft.     Tenderness: There is abdominal tenderness. There is no guarding or rebound.  Neurological:     Mental Status: She is alert.     INTAKE/OUTPUT:  This shift: Total I/O In: 1197.1 [IV Piggyback:1197.1] Out: -   Last 2 shifts: @IOLAST2SHIFTS @  Labs:     Latest Ref Rng & Units 01/09/2024   11:20 PM 12/19/2023    9:58 AM 06/27/2023    8:32 PM  CBC  WBC 4.0 - 10.5 K/uL  12.4  9.5  6.7   Hemoglobin 12.0 - 15.0 g/dL 88.9  86.0  88.8   Hematocrit 36.0 - 46.0 % 34.4  45.9  34.5   Platelets 150 - 400 K/uL 280  236  266       Latest Ref Rng & Units 01/09/2024   11:20 PM 12/19/2023    9:58 AM 06/27/2023    8:32 PM  CMP  Glucose 70 - 99 mg/dL 879  885  97   BUN 6 - 20 mg/dL 14  8  11    Creatinine 0.44 - 1.00 mg/dL 8.99  9.09  9.02   Sodium 135 - 145 mmol/L 137  139  137   Potassium 3.5 - 5.1 mmol/L 3.8  3.7  3.5   Chloride 98 - 111 mmol/L 105  107  105   CO2 22 - 32 mmol/L 22  18  23    Calcium  8.9 - 10.3 mg/dL 9.0  8.8  8.7   Total Protein 6.0 - 8.5 g/dL  6.2    Total Bilirubin 0.0 - 1.2 mg/dL  0.2    Alkaline Phos 44 - 121 IU/L  68    AST 0 - 40 IU/L  15    ALT 0 - 32 IU/L  13      Imaging studies:   CLINICAL DATA:  Abdominal and flank pain   EXAM: 01/10/24 CT ABDOMEN AND PELVIS WITHOUT CONTRAST   TECHNIQUE: Multidetector CT imaging of the abdomen and pelvis was performed following the standard protocol without IV contrast.   RADIATION DOSE REDUCTION: This exam was performed according to the departmental dose-optimization program which includes automated exposure control, adjustment of the mA and/or kV according to patient size and/or use of iterative reconstruction technique.   COMPARISON:  07/21/2021   FINDINGS: Lower chest: Coronary atherosclerotic vascular disease. Lingular subsegmental atelectasis or scarring, image 106 series 6.   Hepatobiliary: Stable sub-centimeter hypodense hepatic lesions favoring cysts. No further imaging workup of these lesions is indicated.   Gallbladder appears unremarkable.   Pancreas: Unremarkable   Spleen: Unremarkable   Adrenals/Urinary Tract: Chronic capsular thickening along the right kidney posteriorly with some faint associated calcification on image 49 series 2. This could be the result of old trauma or remote inflammation but is technically nonspecific. No urinary tract calculi observed.  Adrenal glands unremarkable.   Stomach/Bowel: Acute appendicitis is present with the appendix measuring up to 0.8 cm in diameter, and with periappendiceal inflammatory stranding as on image 42 series 6. No extraluminal gas or abscess. The appendix extends cephalad and posterior from the cecum.   Vascular/Lymphatic: Atherosclerosis is present, including aortoiliac atherosclerotic disease.   Reproductive: Unremarkable.  Incidental mildly retroverted uterus.   Other: No supplemental non-categorized findings.   Musculoskeletal: Mild sclerosis along the iliac sides of the sacroiliac joints, potentially related to arthropathy or mild osteitis condensans ilii. Degenerative disc disease observed at L4-5 and L5-S1.   IMPRESSION: 1. Acute appendicitis with appendiceal wall thickening and surrounding inflammatory stranding. No findings of extraluminal gas or abscess to suggest rupture. 2. Coronary atherosclerotic vascular disease. 3. Chronic capsular thickening along the right kidney posteriorly with some faint associated calcification. This could be the result of old trauma or remote inflammation but is technically nonspecific. 4. 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 5. Mild sclerosis along the iliac sides of the sacroiliac joints, potentially related to arthropathy or mild osteitis condensans ilii. 6.  Aortic Atherosclerosis (ICD10-I70.0).     Electronically Signed   By: Ryan Salvage M.D.   On: 01/10/2024 09:15  Assessment/Plan:  43 y.o. female with acute appendicitis, complicated by pertinent comorbidities including CAD, HTN, prediabetes, obesity, hyperlipidemia and GERD.     - Discuss results with patient and the  recommendation of robotic assisted laparoscopic appendectomy.  - Patient has an extensive cardiac history with coronary artery disease. She is currently on a couple blood thinners, Aspirin  81mg  and Prasugrel  10 Mg. Spoke with Dr. Fernand who is her outpatient  cardiologist with [facility] for cardiac clearance per Dr. Tye. Although this is not a complicated case of acute appendicitis, surgery is recommended to prevent reoccurrence or future complications. Dr. Fernand recommended to keep her on Aspirin  and hold Prasugrel  for at least 4-5 days before surgery since it's not an emergent case. He did not have other concerns about proceeding with surgery. Patient states her last dose of Prasugrel  was yesterday morning. Spoke with Dr. Tye. Planning for surgery Monday, 01/14/24. - Continue ceftriaxone  and flagyl  IV  - Follow labs in the morning, consider discharging if patient is stable with antibiotics outpatient and scheduling surgery outpatient - Continue pain management     - DVT prophylaxis  -- Jessilyn Catino Barrientos PA-C

## 2024-01-10 NOTE — ED Notes (Signed)
 Assumed care of patient. Introduced myself to patient. Patient reports abdominal pain that starts on the left side and radiates to the right.

## 2024-01-10 NOTE — ED Provider Notes (Signed)
 [facility] Provider Note    Event Date/Time   First MD Initiated Contact with Patient 01/10/24 0421     (approximate)   History   Chest Pain   HPI  Sonya [name] is a 43 y.o. female   Past medical history of CAD with prior stent who presents to Emergency Department with left flank pain starting 3 days ago  Atraumatic no obvious inciting event but does have a history of kidney stones, and then a day or 2 later it started to wrap around to her left lower quadrant shooting down to her groin.  She has had no urinary symptoms and no fevers and chills.  She denies chest pain or shortness of breath.   She has had a tubal ligation.    External Medical Documents Reviewed: Cardiology note from earlier this year      Physical Exam   Triage Vital Signs: ED Triage Vitals  Encounter Vitals Group     BP 01/09/24 2319 (!) 141/96     Girls Systolic BP Percentile --      Girls Diastolic BP Percentile --      Boys Systolic BP Percentile --      Boys Diastolic BP Percentile --      Pulse Rate 01/09/24 2319 89     Resp 01/09/24 2319 18     Temp 01/09/24 2319 98.7 F (37.1 C)     Temp Source 01/09/24 2319 Oral     SpO2 01/09/24 2319 99 %     Weight --      Height --      Head Circumference --      Peak Flow --      Pain Score 01/09/24 2318 10     Pain Loc --      Pain Education --      Exclude from Growth Chart --     Most recent vital signs: Vitals:   01/10/24 0408 01/10/24 0418  BP: 126/78   Pulse: 72   Resp: 15   Temp:    SpO2: 100% 100%    General: Awake, no distress.  CV:  Good peripheral perfusion.  Resp:  Normal effort.  Abd:  No distention.  Other:  Mild left-sided CVA tenderness and very mild left lower quadrant tenderness without guarding.  Hemodynamics appropriate reassuring, clear lungs, no fever.  ED Results / Procedures / Treatments   Labs (all labs ordered are listed, but only abnormal results are displayed) Labs  Reviewed  BASIC METABOLIC PANEL WITH GFR - Abnormal; Notable for the following components:      Result Value   Glucose, Bld 120 (*)    All other components within normal limits  CBC - Abnormal; Notable for the following components:   WBC 12.4 (*)    Hemoglobin 11.0 (*)    HCT 34.4 (*)    MCV 74.6 (*)    MCH 23.9 (*)    RDW 17.3 (*)    All other components within normal limits  URINALYSIS, W/ REFLEX TO CULTURE (INFECTION SUSPECTED)  POC URINE PREG, ED  TROPONIN I (HIGH SENSITIVITY)  TROPONIN I (HIGH SENSITIVITY)     I ordered and reviewed the above labs they are notable for white cell count is mildly elevated 12.4  EKG  ED ECG REPORT I, Ginnie Shams, the attending physician, personally viewed and interpreted this ECG.   Date: 01/10/2024  EKG Time: 2319  Rate: 90  Rhythm: sinus  Axis: nl  Intervals:nl  ST&T Change: no stemi    RADIOLOGY I independently reviewed and interpreted chest x-ray and I see no obvious focality pneumothorax I also reviewed radiologist's formal read.   PROCEDURES:  Critical Care performed: No  Procedures   MEDICATIONS ORDERED IN ED: Medications  ibuprofen  (ADVIL ) tablet 600 mg (has no administration in time range)  acetaminophen  (TYLENOL ) tablet 1,000 mg (has no administration in time range)  oxyCODONE  (Oxy IR/ROXICODONE ) immediate release tablet 5 mg (has no administration in time range)  ondansetron  (ZOFRAN -ODT) disintegrating tablet 4 mg (has no administration in time range)    IMPRESSION / MDM / ASSESSMENT AND PLAN / ED COURSE  I reviewed the triage vital signs and the nursing notes.                                Patient's presentation is most consistent with acute presentation with potential threat to life or bodily function.  Differential diagnosis includes, but is not limited to, renal colic, kidney stone, pyelonephritis or urinary tract infection, lumbar strain, musculoskeletal pain, diverticulitis or other intra-abdominal  infection, considered but less likely ovarian torsion   MDM: Most likely renal colic or lumbar strain with left flank pain last several days constant and radiating to the left lower quadrant.  Given some mild tenderness there and history of kidney stones we will do a CT without contrast of the abdomen pelvis.  Give pain medications antinausea medications check urinalysis.  Given the location of the pain primarily in left flank I doubt ovarian pathology like torsion.      FINAL CLINICAL IMPRESSION(S) / ED DIAGNOSES   Final diagnoses:  Left flank pain     Rx / DC Orders   ED Discharge Orders     None        Note:  This document was prepared using Dragon voice recognition software and may include unintentional dictation errors.    Cyrena Mylar, MD 01/10/24 [phone]

## 2024-01-10 NOTE — Progress Notes (Signed)
 PHARMACIST - PHYSICIAN COMMUNICATION  CONCERNING:  Enoxaparin  (Lovenox ) for DVT Prophylaxis   RECOMMENDATION: Patient was prescribed enoxaparin  40mg  q24 hours for VTE prophylaxis.   Filed Weights   01/10/24 1100  Weight: 127.5 kg (281 lb 1.4 oz)   Body mass index is 39.22 kg/m.  Estimated Creatinine Clearance: 107.1 mL/min (by C-G formula based on SCr of 1 mg/dL).  Based on [facility] policy patient is candidate for enoxaparin  0.5mg /kg TBW SQ every 24 hours based on BMI being >30.  DESCRIPTION: Pharmacy has adjusted enoxaparin  dose per [facility] policy.  Patient is now receiving enoxaparin  65 mg every 24 hours.  Eloina Ergle, PharmD Clinical Pharmacist  01/10/2024 11:33 AM

## 2024-01-11 ENCOUNTER — Ambulatory Visit: Payer: Self-pay | Admitting: Surgery

## 2024-01-11 LAB — BASIC METABOLIC PANEL WITH GFR
Anion gap: 6 (ref 5–15)
BUN: 7 mg/dL (ref 6–20)
CO2: 23 mmol/L (ref 22–32)
Calcium: 8.5 mg/dL — ABNORMAL LOW (ref 8.9–10.3)
Chloride: 109 mmol/L (ref 98–111)
Creatinine, Ser: 0.81 mg/dL (ref 0.44–1.00)
GFR, Estimated: >60 mL/min (ref >60)
Glucose, Bld: 93 mg/dL (ref 70–99)
Potassium: 3.5 mmol/L (ref 3.5–5.1)
Sodium: 138 mmol/L (ref 135–145)

## 2024-01-11 LAB — CBC
HCT: 31.7 % — ABNORMAL LOW (ref 36.0–46.0)
Hemoglobin: 10.4 g/dL — ABNORMAL LOW (ref 12.0–15.0)
MCH: 24.6 pg — ABNORMAL LOW (ref 26.0–34.0)
MCHC: 32.8 g/dL (ref 30.0–36.0)
MCV: 74.9 fL — ABNORMAL LOW (ref 80.0–100.0)
Platelets: 241 K/uL (ref 150–400)
RBC: 4.23 MIL/uL (ref 3.87–5.11)
RDW: 17.4 % — ABNORMAL HIGH (ref 11.5–15.5)
WBC: 6.8 K/uL (ref 4.0–10.5)
nRBC: 0 % (ref 0.0–0.2)

## 2024-01-11 LAB — HIV ANTIBODY (ROUTINE TESTING W REFLEX): HIV Screen 4th Generation wRfx: NONREACTIVE

## 2024-01-11 MED ORDER — CIPROFLOXACIN HCL 500 MG PO TABS: 500.0000 mg | ORAL_TABLET | Freq: Two times a day (BID) | ORAL | 0 refills | Status: DC

## 2024-01-11 MED ORDER — METRONIDAZOLE 500 MG PO TABS
500.0000 mg | ORAL_TABLET | Freq: Two times a day (BID) | ORAL | 0 refills | Status: DC
Start: 2024-01-11 — End: 2024-01-30

## 2024-01-11 NOTE — Discharge Summary (Signed)
 [facility]-General [facility]  SURGICAL DISCHARGE SUMMARY  Patient ID: Sonya [name] MRN: [id number] DOB/AGE: 44-Jan-1982 44 y.o.  Admit date: 01/10/2024 Discharge date: 01/11/2024  Discharge Diagnoses Patient Active Problem List   Diagnosis Date Noted   Acute appendicitis with localized peritonitis 01/10/2024   Bilateral lower extremity edema 06/13/2023   Obesity (BMI 35.0-39.9 without comorbidity) 12/13/2022   Prediabetes 08/23/2022   Left leg pain 08/23/2022   Chest pain 01/19/2022   Chest pain, rule out acute myocardial infarction 12/07/2021   STEMI involving right coronary artery (HCC) 10/11/2021   Unstable angina pectoris (HCC) 04/14/2021   HTN (hypertension) 04/14/2021   Coronary artery disease    HLD (hyperlipidemia)    Unstable angina (HCC) 06/08/2020   STEMI (ST elevation myocardial infarction) (HCC) 06/07/2018   GERD (gastroesophageal reflux disease) 06/07/2018   Tobacco abuse 06/07/2018   Consultants Outpatient Cardiologist  Procedures None   Hospital Course:  Patient presented to the [facility] ED on 01/10/2024 with left flank and suprapubic pain.  In the ED, patient was hypertensive with otherwise normal vital signs.Labs showed leukocytosis of 12.4 and Hbg of 11.0, all other lab values were within normal range. With patient experiencing flank pain, the ED wanted to rule out renal colic and ordered a CT renal stone study. Imaging showed periappendiceal inflammatory stranding consistent with acute appendicitis. No extraluminal gas or abscess.  Patient has an extensive cardiac history. Dr. Fernand (her outpatient cardiologist) was consulted.  He recommended to keep her on aspirin  81 mg and hold her prasugrel  10 mg for 4-5 days before surgery. Patient was placed on IV flagyl  and IV rocephin  and was admitted for overnight monitoring. Patient reports doing well. Pain is well controlled with pain medication. Leukocytosis has resolved. She is ambulating and tolerating regular  diet.  Plan for outpatient interval robotic-assisted laparoscopic appendectomy on 01/14/24. She is to take ciprofloxacin 500 mg and metronidazole  500 mg outpatient for 5 days as instructed. Continue to take aspirin  and hold prasugrel  for surgery per Dr. Kathern recommendations.    Physical Examination:  Constitutional: alert, in no acute distress Pulmonary: CTA bilaterally, normal breath sounds Cardiac: regular rate and rhythm Gastrointestinal: soft, mildly tender on RLQ, and non-distended   Allergies as of 01/11/2024       Reactions   Penicillins Hives   Atorvastatin  Calcium  Other (See Comments)   myalgias   Metoprolol  Hives   Penicillins Other (See Comments)   Repatha  [evolocumab ]    Zetia  [ezetimibe ] Rash        Medication List     PAUSE taking these medications    prasugrel  10 MG Tabs tablet Wait to take this until: January 16, 2024 Commonly known as: EFFIENT  TAKE 1 TABLET BY MOUTH EVERY DAY       TAKE these medications    amLODipine  5 MG tablet Commonly known as: NORVASC  TAKE 1 TABLET (5 MG TOTAL) BY MOUTH DAILY.   aspirin  81 MG chewable tablet Commonly known as: Aspirin  Childrens Chew 1 tablet (81 mg total) by mouth daily.   atorvastatin  80 MG tablet Commonly known as: Lipitor  Take 1 tablet (80 mg total) by mouth daily.   busPIRone  10 MG tablet Commonly known as: BUSPAR  Take 10 mg by mouth 2 (two) times daily as needed.   carvedilol  25 MG tablet Commonly known as: Coreg  Take 1 tablet (25 mg total) by mouth 2 (two) times daily.   carvedilol  12.5 MG tablet Commonly known as: COREG  TAKE 1 TABLET BY MOUTH TWICE A DAY  ciprofloxacin 500 MG tablet Commonly known as: Cipro Take 1 tablet (500 mg total) by mouth 2 (two) times daily.   Evolocumab  140 MG/ML Soaj Inject 140 mg into the skin every 14 (fourteen) days.   famotidine  20 MG tablet Commonly known as: PEPCID  Take 1 tablet (20 mg total) by mouth 2 (two) times daily.   FLEXERIL PO Take 5 mg by  mouth. prn   isosorbide  mononitrate 120 MG 24 hr tablet Commonly known as: IMDUR  TAKE 1 TABLET BY MOUTH TWICE A DAY What changed: additional instructions   metroNIDAZOLE  500 MG tablet Commonly known as: FLAGYL  Take 1 tablet (500 mg total) by mouth 2 (two) times daily.   nitroGLYCERIN  0.4 MG SL tablet Commonly known as: NITROSTAT  Place 1 tablet (0.4 mg total) under the tongue every 5 (five) minutes as needed for chest pain.   nystatin cream Commonly known as: MYCOSTATIN Apply 1 application topically at bedtime as needed (irritation).   Probiotic 1-250 BILLION-MG Caps Take 1 capsule by mouth daily.   ranolazine  1000 MG SR tablet Commonly known as: RANEXA  TAKE 1 TABLET BY MOUTH TWICE A DAY   Vitamin D  125 MCG (5000 UT) Caps Take 5,000 Units by mouth daily.   Wegovy  0.25 MG/0.5ML Soaj Generic drug: Semaglutide -Weight Management Inject 0.25 mg into the skin once a week.           Time spent on discharge management including discussion of hospital course, clinical condition, outpatient instructions, prescriptions, and follow up with the patient and members of the medical team: >30 minutes  Brita Jurgensen Barrientos PA-C

## 2024-01-11 NOTE — Plan of Care (Signed)

## 2024-01-11 NOTE — TOC CM/SW Note (Signed)
 Transition of Care [facility]) - Inpatient Brief Assessment   Patient Details  Name: Sonya [name] MRN: [id number] Date of Birth: 01-05-1981  Transition of [facility]) CM/SW Contact:    Lauraine JAYSON Carpen, LCSW Phone Number: 01/11/2024, 3:34 PM   Clinical Narrative: Patient has orders to discharge home today. Chart reviewed. No TOC needs identified. CSW signing off.  Transition of Care Asessment: Insurance and Status: Insurance coverage has been reviewed Patient has primary care physician: Yes Home environment has been reviewed: Single family home Prior level of function:: Not documented Prior/Current Home Services: No current home services Social Drivers of Health Review: SDOH reviewed no interventions necessary Readmission risk has been reviewed: Yes Transition of care needs: no transition of care needs at this time

## 2024-01-11 NOTE — Discharge Instructions (Addendum)
 Plan to return for robotic-assisted laparoscopic appendectomy on Monday, January 14, 2024.  Take outpatient antibiotics as instructed.

## 2024-01-14 ENCOUNTER — Encounter: Payer: Self-pay | Admitting: Surgery

## 2024-01-14 ENCOUNTER — Ambulatory Visit
Admission: RE | Admit: 2024-01-14 | Discharge: 2024-01-14 | Disposition: A | Discharge status: Home / Self Care | Source: Ambulatory Visit | Attending: Surgery | Admitting: Surgery

## 2024-01-14 ENCOUNTER — Encounter
Admission: RE | Disposition: A | Discharge status: Home / Self Care | Payer: Self-pay | Source: Ambulatory Visit | Attending: Surgery

## 2024-01-14 ENCOUNTER — Other Ambulatory Visit: Payer: Self-pay

## 2024-01-14 ENCOUNTER — Ambulatory Visit: Admitting: Anesthesiology

## 2024-01-14 ENCOUNTER — Ambulatory Visit: Admit: 2024-01-14 | Admitting: Surgery

## 2024-01-14 DIAGNOSIS — I2510 Atherosclerotic heart disease of native coronary artery without angina pectoris: Secondary | ICD-10-CM | POA: Insufficient documentation

## 2024-01-14 DIAGNOSIS — Z955 Presence of coronary angioplasty implant and graft: Secondary | ICD-10-CM | POA: Diagnosis not present

## 2024-01-14 DIAGNOSIS — I10 Essential (primary) hypertension: Secondary | ICD-10-CM | POA: Insufficient documentation

## 2024-01-14 DIAGNOSIS — Z87891 Personal history of nicotine dependence: Secondary | ICD-10-CM | POA: Insufficient documentation

## 2024-01-14 DIAGNOSIS — K353 Acute appendicitis with localized peritonitis, without perforation or gangrene: Secondary | ICD-10-CM

## 2024-01-14 DIAGNOSIS — I252 Old myocardial infarction: Secondary | ICD-10-CM | POA: Diagnosis not present

## 2024-01-14 DIAGNOSIS — K3580 Unspecified acute appendicitis: Secondary | ICD-10-CM | POA: Diagnosis present

## 2024-01-14 DIAGNOSIS — K219 Gastro-esophageal reflux disease without esophagitis: Secondary | ICD-10-CM | POA: Diagnosis not present

## 2024-01-14 HISTORY — PX: XI ROBOTIC LAPAROSCOPIC ASSISTED APPENDECTOMY: SHX6877

## 2024-01-14 LAB — GLUCOSE, CAPILLARY
Glucose-Capillary: 115 mg/dL — ABNORMAL HIGH (ref 70–99)
Glucose-Capillary: 142 mg/dL — ABNORMAL HIGH (ref 70–99)

## 2024-01-14 LAB — POCT PREGNANCY, URINE: Preg Test, Ur: NEGATIVE

## 2024-01-14 SURGERY — APPENDECTOMY, ROBOT-ASSISTED, LAPAROSCOPIC
Anesthesia: General

## 2024-01-14 MED ORDER — EPHEDRINE SULFATE-NACL 50-0.9 MG/10ML-% IV SOSY
PREFILLED_SYRINGE | INTRAVENOUS | Status: DC | PRN
  Administered 2024-01-14 (×3): 5 mg via INTRAVENOUS

## 2024-01-14 MED ORDER — CHLORHEXIDINE GLUCONATE CLOTH 2 % EX PADS
6.0000 | MEDICATED_PAD | Freq: Once | CUTANEOUS | Status: AC
  Administered 2024-01-14: 6 via TOPICAL

## 2024-01-14 MED ORDER — SUGAMMADEX SODIUM 200 MG/2ML IV SOLN
INTRAVENOUS | Status: DC | PRN
  Administered 2024-01-14: 280 mg via INTRAVENOUS

## 2024-01-14 MED ORDER — ACETAMINOPHEN 10 MG/ML IV SOLN
INTRAVENOUS | Status: AC
  Filled 2024-01-14: qty 100

## 2024-01-14 MED ORDER — CHLORHEXIDINE GLUCONATE 0.12 % MT SOLN
OROMUCOSAL | Status: AC
  Filled 2024-01-14: qty 15

## 2024-01-14 MED ORDER — SODIUM CHLORIDE 0.9 % IV SOLN: INTRAVENOUS | Status: DC

## 2024-01-14 MED ORDER — ROCURONIUM BROMIDE 10 MG/ML (PF) SYRINGE
PREFILLED_SYRINGE | INTRAVENOUS | Status: AC
  Filled 2024-01-14: qty 10

## 2024-01-14 MED ORDER — FENTANYL CITRATE (PF) 100 MCG/2ML IJ SOLN
INTRAMUSCULAR | Status: AC
  Filled 2024-01-14: qty 2

## 2024-01-14 MED ORDER — MIDAZOLAM HCL 2 MG/2ML IJ SOLN
INTRAMUSCULAR | Status: DC | PRN
  Administered 2024-01-14: 2 mg via INTRAVENOUS

## 2024-01-14 MED ORDER — KETOROLAC TROMETHAMINE 30 MG/ML IJ SOLN
INTRAMUSCULAR | Status: AC
Start: 2024-01-14 — End: 2024-01-14
  Filled 2024-01-14: qty 1

## 2024-01-14 MED ORDER — 0.9 % SODIUM CHLORIDE (POUR BTL) OPTIME
TOPICAL | Status: DC | PRN
  Administered 2024-01-14: 500 mL

## 2024-01-14 MED ORDER — ACETAMINOPHEN 325 MG PO TABS
650.0000 mg | ORAL_TABLET | Freq: Three times a day (TID) | ORAL | 0 refills | Status: AC | PRN
  Filled 2024-01-14: qty 40, 7d supply, fill #0

## 2024-01-14 MED ORDER — FENTANYL CITRATE (PF) 100 MCG/2ML IJ SOLN
INTRAMUSCULAR | Status: DC | PRN
  Administered 2024-01-14: 50 ug via INTRAVENOUS
  Administered 2024-01-14 (×2): 25 ug via INTRAVENOUS

## 2024-01-14 MED ORDER — DROPERIDOL 2.5 MG/ML IJ SOLN
0.6250 mg | Freq: Once | INTRAMUSCULAR | Status: AC | PRN
  Administered 2024-01-14: 0.625 mg via INTRAVENOUS

## 2024-01-14 MED ORDER — OXYCODONE HCL 5 MG PO TABS
ORAL_TABLET | ORAL | Status: AC
  Filled 2024-01-14: qty 1

## 2024-01-14 MED ORDER — ORAL CARE MOUTH RINSE: 15.0000 mL | Freq: Once | OROMUCOSAL | Status: AC

## 2024-01-14 MED ORDER — DOCUSATE SODIUM 100 MG PO CAPS
100.0000 mg | ORAL_CAPSULE | Freq: Two times a day (BID) | ORAL | 0 refills | Status: AC | PRN
  Filled 2024-01-14: qty 20, 10d supply, fill #0

## 2024-01-14 MED ORDER — BUPIVACAINE HCL (PF) 0.5 % IJ SOLN
INTRAMUSCULAR | Status: AC
  Filled 2024-01-14: qty 30

## 2024-01-14 MED ORDER — OXYCODONE-ACETAMINOPHEN 5-325 MG PO TABS
1.0000 | ORAL_TABLET | Freq: Three times a day (TID) | ORAL | 0 refills | Status: AC | PRN
  Filled 2024-01-14: qty 6, 2d supply, fill #0

## 2024-01-14 MED ORDER — ISOSORBIDE MONONITRATE ER 120 MG PO TB24: 120.0000 mg | ORAL_TABLET | Freq: Two times a day (BID) | ORAL | Status: AC

## 2024-01-14 MED ORDER — LIDOCAINE HCL (PF) 2 % IJ SOLN
INTRAMUSCULAR | Status: AC
  Filled 2024-01-14: qty 5

## 2024-01-14 MED ORDER — FENTANYL CITRATE (PF) 100 MCG/2ML IJ SOLN
25.0000 ug | INTRAMUSCULAR | Status: DC | PRN
  Administered 2024-01-14 (×2): 50 ug via INTRAVENOUS

## 2024-01-14 MED ORDER — PROPOFOL 10 MG/ML IV BOLUS
INTRAVENOUS | Status: DC | PRN
  Administered 2024-01-14: 200 mg via INTRAVENOUS

## 2024-01-14 MED ORDER — BUPIVACAINE HCL (PF) 0.5 % IJ SOLN
INTRAMUSCULAR | Status: DC | PRN
  Administered 2024-01-14: 35 mL

## 2024-01-14 MED ORDER — DROPERIDOL 2.5 MG/ML IJ SOLN
INTRAMUSCULAR | Status: AC
  Filled 2024-01-14: qty 2

## 2024-01-14 MED ORDER — EPHEDRINE 5 MG/ML INJ
INTRAVENOUS | Status: AC
  Filled 2024-01-14: qty 5

## 2024-01-14 MED ORDER — PHENYLEPHRINE 80 MCG/ML (10ML) SYRINGE FOR IV PUSH (FOR BLOOD PRESSURE SUPPORT)
PREFILLED_SYRINGE | INTRAVENOUS | Status: DC | PRN
  Administered 2024-01-14: 120 ug via INTRAVENOUS
  Administered 2024-01-14: 80 ug via INTRAVENOUS
  Administered 2024-01-14: 40 ug via INTRAVENOUS

## 2024-01-14 MED ORDER — ACETAMINOPHEN 10 MG/ML IV SOLN: 1000.0000 mg | Freq: Once | INTRAVENOUS | Status: DC | PRN

## 2024-01-14 MED ORDER — LIDOCAINE HCL (CARDIAC) PF 100 MG/5ML IV SOSY
PREFILLED_SYRINGE | INTRAVENOUS | Status: DC | PRN
  Administered 2024-01-14: 100 mg via INTRAVENOUS

## 2024-01-14 MED ORDER — ACETAMINOPHEN 10 MG/ML IV SOLN
INTRAVENOUS | Status: DC | PRN
  Administered 2024-01-14: 1000 mg via INTRAVENOUS

## 2024-01-14 MED ORDER — ONDANSETRON HCL 4 MG/2ML IJ SOLN
INTRAMUSCULAR | Status: DC | PRN
  Administered 2024-01-14: 4 mg via INTRAVENOUS

## 2024-01-14 MED ORDER — LIDOCAINE-EPINEPHRINE (PF) 1 %-1:200000 IJ SOLN
INTRAMUSCULAR | Status: AC
  Filled 2024-01-14: qty 30

## 2024-01-14 MED ORDER — CEFAZOLIN SODIUM-DEXTROSE 2-4 GM/100ML-% IV SOLN
INTRAVENOUS | Status: AC
  Filled 2024-01-14: qty 100

## 2024-01-14 MED ORDER — FENTANYL CITRATE (PF) 100 MCG/2ML IJ SOLN
INTRAMUSCULAR | Status: AC
Start: 2024-01-14 — End: 2024-01-14
  Filled 2024-01-14: qty 2

## 2024-01-14 MED ORDER — CHLORHEXIDINE GLUCONATE 0.12 % MT SOLN
15.0000 mL | Freq: Once | OROMUCOSAL | Status: AC
  Administered 2024-01-14: 15 mL via OROMUCOSAL

## 2024-01-14 MED ORDER — DEXAMETHASONE SODIUM PHOSPHATE 10 MG/ML IJ SOLN
INTRAMUSCULAR | Status: AC
  Filled 2024-01-14: qty 1

## 2024-01-14 MED ORDER — OXYCODONE HCL 5 MG/5ML PO SOLN: 5.0000 mg | Freq: Once | ORAL | Status: AC | PRN

## 2024-01-14 MED ORDER — MIDAZOLAM HCL 2 MG/2ML IJ SOLN
INTRAMUSCULAR | Status: AC
  Filled 2024-01-14: qty 2

## 2024-01-14 MED ORDER — ROCURONIUM BROMIDE 100 MG/10ML IV SOLN
INTRAVENOUS | Status: DC | PRN
  Administered 2024-01-14: 60 mg via INTRAVENOUS
  Administered 2024-01-14: 10 mg via INTRAVENOUS
  Administered 2024-01-14: 20 mg via INTRAVENOUS

## 2024-01-14 MED ORDER — CEFAZOLIN SODIUM-DEXTROSE 2-4 GM/100ML-% IV SOLN
2.0000 g | INTRAVENOUS | Status: AC
  Administered 2024-01-14: 2 g via INTRAVENOUS

## 2024-01-14 MED ORDER — ONDANSETRON HCL 4 MG/2ML IJ SOLN
INTRAMUSCULAR | Status: AC
  Filled 2024-01-14: qty 2

## 2024-01-14 MED ORDER — DEXAMETHASONE SODIUM PHOSPHATE 10 MG/ML IJ SOLN
INTRAMUSCULAR | Status: DC | PRN
  Administered 2024-01-14: 8 mg via INTRAVENOUS

## 2024-01-14 MED ORDER — OXYCODONE HCL 5 MG PO TABS
5.0000 mg | ORAL_TABLET | Freq: Once | ORAL | Status: AC | PRN
  Administered 2024-01-14: 5 mg via ORAL

## 2024-01-14 SURGICAL SUPPLY — 47 items
ANCHOR TIS RET SYS 235ML (MISCELLANEOUS) ×1 IMPLANT
BAG PRESSURE INF REUSE 1000 (BAG) IMPLANT
COVER TIP SHEARS 8 DVNC (MISCELLANEOUS) ×1 IMPLANT
COVER WAND RF STERILE (DRAPES) ×1 IMPLANT
DEFOGGER SCOPE WARM SEASHARP (MISCELLANEOUS) ×1 IMPLANT
DERMABOND ADVANCED .7 DNX12 (GAUZE/BANDAGES/DRESSINGS) ×1 IMPLANT
DRAIN CHANNEL JP 15F RND 3/16 (MISCELLANEOUS) IMPLANT
DRAPE ARM DVNC X/XI (DISPOSABLE) ×3 IMPLANT
DRAPE COLUMN DVNC XI (DISPOSABLE) ×1 IMPLANT
ELECTRODE REM PT RTRN 9FT ADLT (ELECTROSURGICAL) ×1 IMPLANT
EVACUATOR DRAINAGE 7X20 100CC (MISCELLANEOUS) IMPLANT
EVACUATOR SILICONE 100CC (DRAIN) IMPLANT
FORCEPS BPLR FENES DVNC XI (FORCEP) ×1 IMPLANT
GLOVE BIOGEL PI IND STRL 7.0 (GLOVE) ×2 IMPLANT
GLOVE SURG SYN 6.5 PF PI (GLOVE) ×4 IMPLANT
GOWN STRL REUS W/ TWL LRG LVL3 (GOWN DISPOSABLE) ×4 IMPLANT
GRASPER SUT TROCAR 14GX15 (MISCELLANEOUS) IMPLANT
IRRIGATOR SUCT 8 DISP DVNC XI (IRRIGATION / IRRIGATOR) IMPLANT
IV NS 1000ML BAXH (IV SOLUTION) IMPLANT
KIT TURNOVER KIT A (KITS) ×1 IMPLANT
LABEL OR SOLS (LABEL) IMPLANT
MANIFOLD NEPTUNE II (INSTRUMENTS) ×1 IMPLANT
NDL HYPO 22X1.5 SAFETY MO (MISCELLANEOUS) ×1 IMPLANT
NDL INSUFFLATION 14GA 120MM (NEEDLE) ×1 IMPLANT
NEEDLE HYPO 22X1.5 SAFETY MO (MISCELLANEOUS) ×1 IMPLANT
NEEDLE INSUFFLATION 14GA 120MM (NEEDLE) ×1 IMPLANT
OBTURATOR OPTICAL LONG 8 DVNC (TROCAR) IMPLANT
OBTURATOR OPTICALSTD 8 DVNC (TROCAR) ×1 IMPLANT
PACK LAP CHOLECYSTECTOMY (MISCELLANEOUS) ×1 IMPLANT
RELOAD STAPLE 45 2.5 WHT DVNC (STAPLE) IMPLANT
RELOAD STAPLE 45 3.5 BLU DVNC (STAPLE) IMPLANT
SCISSORS MNPLR CVD DVNC XI (INSTRUMENTS) ×1 IMPLANT
SEAL UNIV 5-12 XI (MISCELLANEOUS) ×3 IMPLANT
SEALER VESSEL EXT DVNC XI (MISCELLANEOUS) IMPLANT
SET TUBE SMOKE EVAC HIGH FLOW (TUBING) ×1 IMPLANT
SOLUTION ELECTROSURG ANTI STCK (MISCELLANEOUS) ×1 IMPLANT
STAPLER 45 SUREFORM DVNC (STAPLE) IMPLANT
SUT MNCRL AB 4-0 PS2 18 (SUTURE) ×1 IMPLANT
SUT VIC AB 3-0 SH 27X BRD (SUTURE) IMPLANT
SUT VICRYL 0 UR6 27IN ABS (SUTURE) ×1 IMPLANT
SUTURE EHLN 3-0 FS-10 30 BLK (SUTURE) IMPLANT
SYR 30ML LL (SYRINGE) ×1 IMPLANT
SYSTEM WECK SHIELD CLOSURE (TROCAR) IMPLANT
TAPE TRANSPORE STRL 2 31045 (GAUZE/BANDAGES/DRESSINGS) IMPLANT
TRAP FLUID SMOKE EVACUATOR (MISCELLANEOUS) ×1 IMPLANT
TRAY FOLEY MTR SLVR 16FR STAT (SET/KITS/TRAYS/PACK) ×1 IMPLANT
WATER STERILE IRR 500ML POUR (IV SOLUTION) ×1 IMPLANT

## 2024-01-14 NOTE — Transfer of Care (Signed)
 Immediate Anesthesia Transfer of Care Note  Patient: Sonya [name]  Procedure(s) Performed: APPENDECTOMY, ROBOT-ASSISTED, LAPAROSCOPIC  Patient Location: PACU  Anesthesia Type:General  Level of Consciousness: drowsy and patient cooperative  Airway & Oxygen Therapy: Patient Spontanous Breathing and Patient connected to face mask oxygen  Post-op Assessment: Report given to RN and Post -op Vital signs reviewed and stable  Post vital signs: Reviewed and stable  Last Vitals:  Vitals Value Taken Time  BP 131/85 01/14/24 12:30  Temp 36.6 C 01/14/24 12:29  Pulse 78 01/14/24 12:31  Resp 16 01/14/24 12:31  SpO2 100 % 01/14/24 12:31  Vitals shown include unfiled device data.  Last Pain:  Vitals:   01/14/24 1230  TempSrc:   PainSc: Asleep         Complications: No notable events documented.

## 2024-01-14 NOTE — Anesthesia Preprocedure Evaluation (Addendum)
 Anesthesia Evaluation  Patient identified by MRN, date of birth, ID band Patient awake    Reviewed: Allergy & Precautions, H&P , NPO status , Patient's Chart, lab work & pertinent test results  Airway Mallampati: II  TM Distance: >3 FB Neck ROM: full    Dental  (+) Missing,    Pulmonary former smoker   Pulmonary exam normal        Cardiovascular hypertension, + CAD, + Past MI and + Cardiac Stents  Normal cardiovascular exam  STEMI s/p PCI x 4, Pt reports occasional chest pain with stress. She reports the last attempt at heart craterization was aborted and she reports her chest pains attributed to heart spasms.   ECHO 10/23:  1. Left ventricular ejection fraction, by estimation, is 65 to 70%. The  left ventricle has normal function. The left ventricle has no regional  wall motion abnormalities. Left ventricular diastolic parameters were  normal.   2. Right ventricular systolic function is normal. The right ventricular  size is normal.   3. The mitral valve is normal in structure. No evidence of mitral valve  regurgitation. No evidence of mitral stenosis.   4. The aortic valve is tricuspid. Aortic valve regurgitation is not  visualized. No aortic stenosis is present.   5. The inferior vena cava is normal in size with greater than 50%  respiratory variability, suggesting right atrial pressure of 3 mmHg.     Neuro/Psych negative neurological ROS  negative psych ROS   GI/Hepatic Neg liver ROS,GERD  ,,  Endo/Other  negative endocrine ROS    Renal/GU      Musculoskeletal   Abdominal  (+) + obese  Peds  Hematology  (+) Blood dyscrasia, anemia   Anesthesia Other Findings Past Medical History: No date: Coronary artery disease No date: GERD (gastroesophageal reflux disease) No date: H/O heart artery stent No date: Hyperlipidemia No date: Hypertension No date: Myocardial infarction (HCC) No date: Tobacco abuse  Past  Surgical History: 06/10/2018: CORONARY STENT INTERVENTION; N/A     Comment:  Procedure: CORONARY STENT INTERVENTION;  Surgeon:               Florencio Cara BIRCH, MD;  Location: [facility] INVASIVE CV LAB;               Service: Cardiovascular;  Laterality: N/A; 04/14/2021: CORONARY STENT INTERVENTION; N/A     Comment:  Procedure: CORONARY STENT INTERVENTION;  Surgeon:               Ammon Blunt, MD;  Location: [facility] INVASIVE CV               LAB;  Service: Cardiovascular;  Laterality: N/A; 01/20/2022: CORONARY STENT INTERVENTION; N/A     Comment:  Procedure: CORONARY STENT INTERVENTION;  Surgeon:               Ammon Blunt, MD;  Location: [facility] INVASIVE CV               LAB;  Service: Cardiovascular;  Laterality: N/A; 10/11/2021: CORONARY/GRAFT ACUTE MI REVASCULARIZATION; N/A     Comment:  Procedure: Coronary/Graft Acute MI Revascularization;                Surgeon: Mady Bruckner, MD;  Location: [facility] INVASIVE               CV LAB;  Service: Cardiovascular;  Laterality: N/A; 06/10/2018: LEFT HEART CATH AND CORONARY ANGIOGRAPHY; N/A     Comment:  Procedure: With coronary intervention and  stenting;                Surgeon: Fernand Denyse LABOR, MD;  Location: [facility] INVASIVE CV              LAB;  Service: Cardiovascular;  Laterality: N/A; 06/15/2020: LEFT HEART CATH AND CORONARY ANGIOGRAPHY; Left     Comment:  Procedure: LEFT HEART CATH AND CORONARY ANGIOGRAPHY;                Surgeon: Fernand Denyse LABOR, MD;  Location: [facility] INVASIVE CV              LAB;  Service: Cardiovascular;  Laterality: Left; 04/14/2021: LEFT HEART CATH AND CORONARY ANGIOGRAPHY; N/A     Comment:  Procedure: LEFT HEART CATH AND CORONARY ANGIOGRAPHY with              intervention;  Surgeon: Fernand Denyse LABOR, MD;  Location:               [facility] INVASIVE CV LAB;  Service: Cardiovascular;                Laterality: N/A; 10/11/2021: LEFT HEART CATH AND CORONARY ANGIOGRAPHY; N/A     Comment:  Procedure: LEFT HEART CATH AND  CORONARY ANGIOGRAPHY;                Surgeon: Mady Bruckner, MD;  Location: [facility] INVASIVE               CV LAB;  Service: Cardiovascular;  Laterality: N/A; 01/20/2022: LEFT HEART CATH AND CORONARY ANGIOGRAPHY; N/A     Comment:  Procedure: LEFT HEART CATH AND CORONARY ANGIOGRAPHY;                Surgeon: Fernand Denyse LABOR, MD;  Location: [facility] INVASIVE CV              LAB;  Service: Cardiovascular;  Laterality: N/A; 04/05/2022: LEFT HEART CATH AND CORONARY ANGIOGRAPHY; N/A     Comment:  Procedure: LEFT HEART CATH AND CORONARY ANGIOGRAPHY;                Surgeon: Dann Candyce RAMAN, MD;  Location: [facility] INVASIVE              CV LAB;  Service: Cardiovascular;  Laterality: N/A; No date: TUBAL LIGATION  BMI    Body Mass Index: 39.05 kg/m      Reproductive/Obstetrics negative OB ROS                              Anesthesia Physical Anesthesia Plan  ASA: 3  Anesthesia Plan: General ETT   Post-op Pain Management: Ofirmev  IV (intra-op)*, Precedex and Toradol  IV (intra-op)*   Induction: Intravenous  PONV Risk Score and Plan: 2 and Ondansetron , Dexamethasone  and Midazolam   Airway Management Planned: Oral ETT  Additional Equipment:   Intra-op Plan:   Post-operative Plan: Extubation in OR  Informed Consent: I have reviewed the patients History and Physical, chart, labs and discussed the procedure including the risks, benefits and alternatives for the proposed anesthesia with the patient or authorized representative who has indicated his/her understanding and acceptance.     Dental Advisory Given  Plan Discussed with: CRNA and Surgeon  Anesthesia Plan Comments:          Anesthesia Quick Evaluation

## 2024-01-14 NOTE — Discharge Instructions (Signed)
 Laparoscopic Appendectomy, Care After This sheet gives you information about how to care for yourself after your procedure. Your doctor may also give you more specific instructions. If you have problems or questions, contact your doctor. Follow these instructions at home: Care for cuts from surgery (incisions)  Follow instructions from your doctor about how to take care of your cuts from surgery. Make sure you: Wash your hands with soap and water before you change your bandage (dressing). If you cannot use soap and water, use hand sanitizer. Change your bandage as told by your doctor. Leave stitches (sutures), skin glue, or skin tape (adhesive) strips in place. They may need to stay in place for 2 weeks or longer. If tape strips get loose and curl up, you may trim the loose edges. Do not remove tape strips completely unless your doctor says it is okay. Do not take baths, swim, or use a hot tub until your doctor says it is okay. OK TO SHOWER 24HRS AFTER YOUR SURGERY.  Check your surgical cut area every day for signs of infection. Check for: More redness, swelling, or pain. More fluid or blood. Warmth. Pus or a bad smell. Activity Do not drive or use heavy machinery while taking prescription pain medicine. Do not play contact sports until your doctor says it is okay. Do not drive for 24 hours if you were given a medicine to help you relax (sedative). Rest as needed. Do not return to work or school until your doctor says it is okay. General instructions RESUME ASPIRIN  AND EFFIENT  IN 48HRS  tylenol  and advil  as needed for discomfort.  Please alternate between the two every four hours as needed for pain.    Use narcotics, if prescribed, only when tylenol  and motrin  is not enough to control pain.  325-650mg  every 8hrs to max of 3000mg /24hrs (including the 325mg  in every norco dose) for the tylenol .    Advil  up to 800mg  per dose every 8hrs as needed for pain.   To prevent or treat constipation  while you are taking prescription pain medicine, your doctor may recommend that you: Drink enough fluid to keep your pee (urine) clear or pale yellow. Take over-the-counter or prescription medicines. Eat foods that are high in fiber, such as fresh fruits and vegetables, whole grains, and beans. Limit foods that are high in fat and processed sugars, such as fried and sweet foods. Contact a doctor if: You develop a rash. You have more redness, swelling, or pain around your surgical cuts. You have more fluid or blood coming from your surgical cuts. Your surgical cuts feel warm to the touch. You have pus or a bad smell coming from your surgical cuts. You have a fever. One or more of your surgical cuts breaks open. Get help right away if: You have trouble breathing. You have chest pain. You faint or feel dizzy when you stand. You have leg pain. This information is not intended to replace advice given to you by your health care provider. Make sure you discuss any questions you have with your health care provider. Document Released: 03/21/2008 Document Revised: 01/01/2016 Document Reviewed: 11/29/2015 Elsevier Interactive Patient Education  2019 [employer].

## 2024-01-14 NOTE — Anesthesia Procedure Notes (Signed)
 Procedure Name: Intubation Date/Time: 01/14/2024 11:00 AM  Performed by: Dyane Mass, CRNAPre-anesthesia Checklist: Patient identified, Emergency Drugs available, Suction available and Patient being monitored Patient Re-evaluated:Patient Re-evaluated prior to induction Oxygen Delivery Method: Circle system utilized Preoxygenation: Pre-oxygenation with 100% oxygen Induction Type: IV induction Ventilation: Mask ventilation without difficulty Laryngoscope Size: McGrath and 3 Grade View: Grade I Tube type: Oral Tube size: 7.0 mm Number of attempts: 1 Airway Equipment and Method: Stylet and Oral airway Placement Confirmation: ETT inserted through vocal cords under direct vision, positive ETCO2 and breath sounds checked- equal and bilateral Secured at: 21 cm Tube secured with: Tape Dental Injury: Teeth and Oropharynx as per pre-operative assessment

## 2024-01-14 NOTE — Interval H&P Note (Signed)
 No issues reported since discharge. No additional questions or concerns. Will proceed with robotic assisted laparoscopic appendectomy.

## 2024-01-14 NOTE — Op Note (Addendum)
 Preoperative diagnosis: acute appendicitis  Postoperative diagnosis: Same  Procedure: Robotic assisted laparoscopic appendectomy.  Anesthesia: GETA  Surgeon: Henriette Pierre  Wound Classification: clean contaminated  Specimen: Appendix  Complications: None  Estimated Blood Loss: 3 mL   Indications: Patient is a 43 y.o. female  presented with above.  Please see H&P for further details.    FIndings: 1.  Irritated appendix  2. No peri-appendiceal abscess or phlegmon 3. Normal anatomy 4. Appendiceal artery ligated and divided with stapler 5. Adequate hemostasis.   Description of procedure: The patient was placed on the operating table in the supine position, left arm tucked. General anesthesia was induced. A time-out was completed verifying correct patient, procedure, site, positioning, and implant(s) and/or special equipment prior to beginning this procedure. The abdomen was prepped and draped in the usual sterile fashion.   Palmer's point located and Veress needle was inserted.  After confirming 2 clicks and a positive saline drop test, gas insufflation was initiated until the abdominal pressure was measured at 15 mmHg.  Afterwards, the Veress needle was removed and 5mm port placed through same area, confirming no adhesions.  After local was infused, Ports then placed in following locations: a 8 mm port was placed through a periumbilical site.   2 additional incision was made 8 cm apart each side along the left side of the abdominal wall from the initial incision.  An 8 mm port caudad and 12mm port cephalad from initial incision, both under direct visualization. 12mm port incision site extended from optiviewed site and 5mm port replaced with 12mm port.  No injuries from trocar placements were noted. The table was placed in the Trendelenburg position with the right side elevated.  Xi robotic platform was then brought to the operative field and docked.  An inflamed appendix was identified and  elevated.  Infection was present within the abdominal cavity due to appendicitis. Window created at base of appendix in the mesentery.   A blue load linear cutting stapler was then used to divide and staple the base of the appendix. It was reloaded with a vascular cartridge X2 and the mesoappendix similarly divided.  Three area so pulsatile bleeding noted along staple lines, all controlled with electrocautery.   The appendix was placed in an endoscopic retrieval bag and removed.   The appendiceal stump and mesoappendix staple line examined again and hemostasis noted under no tension.  Excess blood suctioned out from area.  No other pathology was identified within pelvis. The 12 mm trocar removed and port site closed with Efx Shield using 0 vicryl under direct vision. Remaining trocars were removed under direct vision. No bleeding was noted.The abdomen was allowed to collapse. All skin incisions then closed with subcuticular sutures Monocryl 4-0.  Wounds then dressed with dermabond.  The patient tolerated the procedure well, awakened from anesthesia and was taken to the postanesthesia care unit in satisfactory condition.  Sponge count and instrument count correct at the end of the procedure.

## 2024-01-15 ENCOUNTER — Encounter: Payer: Self-pay | Admitting: Surgery

## 2024-01-15 LAB — SURGICAL PATHOLOGY

## 2024-01-15 NOTE — Anesthesia Postprocedure Evaluation (Signed)
 Anesthesia Post Note  Patient: Sonya [name]  Procedure(s) Performed: APPENDECTOMY, ROBOT-ASSISTED, LAPAROSCOPIC  Patient location during evaluation: PACU Anesthesia Type: General Level of consciousness: awake and alert Pain management: pain level controlled Vital Signs Assessment: post-procedure vital signs reviewed and stable Respiratory status: spontaneous breathing, nonlabored ventilation and respiratory function stable Cardiovascular status: blood pressure returned to baseline and stable Postop Assessment: no apparent nausea or vomiting Anesthetic complications: no   No notable events documented.   Last Vitals:  Vitals:   01/14/24 1346 01/14/24 1348  BP:  121/87  Pulse: 65   Resp: 20   Temp: 36.4 C   SpO2: 95%     Last Pain:  Vitals:   01/14/24 1346  TempSrc: Temporal  PainSc: 3                  Camellia Merilee Louder

## 2024-01-30 ENCOUNTER — Encounter: Payer: Self-pay | Admitting: Cardiology

## 2024-01-30 ENCOUNTER — Ambulatory Visit (INDEPENDENT_AMBULATORY_CARE_PROVIDER_SITE_OTHER): Admitting: Cardiology

## 2024-01-30 VITALS — BP 112/74 | HR 76 | Ht 71.0 in | Wt 280.0 lb

## 2024-01-30 DIAGNOSIS — E669 Obesity, unspecified: Secondary | ICD-10-CM | POA: Diagnosis not present

## 2024-01-30 DIAGNOSIS — I2510 Atherosclerotic heart disease of native coronary artery without angina pectoris: Secondary | ICD-10-CM

## 2024-01-30 DIAGNOSIS — Z0130 Encounter for examination of blood pressure without abnormal findings: Secondary | ICD-10-CM

## 2024-01-30 MED ORDER — CARVEDILOL 12.5 MG PO TABS: 12.5000 mg | ORAL_TABLET | Freq: Two times a day (BID) | ORAL | 1 refills | Status: AC

## 2024-01-30 MED ORDER — DOXYCYCLINE HYCLATE 100 MG PO TABS: 100.0000 mg | ORAL_TABLET | Freq: Two times a day (BID) | ORAL | 0 refills | Status: AC

## 2024-01-30 MED ORDER — METRONIDAZOLE 500 MG PO TABS
500.0000 mg | ORAL_TABLET | Freq: Two times a day (BID) | ORAL | 0 refills | Status: AC
Start: 2024-01-30 — End: ?

## 2024-01-30 NOTE — Progress Notes (Signed)
 Cardiology Office Note   Date:  01/30/2024   ID:  Sonya [name], DOB 1980/08/14, MRN [id number]  PCP:  Carin Gauze, NP  Cardiologist:  Gauze Carin, NP      History of Present Illness: Sonya [name] is a 43 y.o. female who presents for  Chief Complaint  Patient presents with   Follow-up    1 month follow up     Patient in office for one month follow up. Patient has not started Wegovy  due to insurance. Patient doing well, thinks she may have a tooth abscess, jaw was swollen, will send in doxycyline. Follow up with her dentist. Was in the hospital recently for appendectomy, recovering well.       Past Medical History:  Diagnosis Date   Coronary artery disease    GERD (gastroesophageal reflux disease)    H/O heart artery stent    Hyperlipidemia    Hypertension    Myocardial infarction (HCC)    Tobacco abuse      Past Surgical History:  Procedure Laterality Date   CORONARY STENT INTERVENTION N/A 06/10/2018   Procedure: CORONARY STENT INTERVENTION;  Surgeon: Florencio Cara BIRCH, MD;  Location: [facility] IN[facility]IVE [facility];  Service: Cardiovascular;  Laterality: N/A;   CORONARY STENT INTERVENTION N/A 04/14/2021   Procedure: CORONARY STENT INTERVENTION;  Surgeon: Ammon Blunt, MD;  Location: [facility];  Service: Cardiovascular;  Laterality: N/A;   CORONARY STENT INTERVENTION N/A 01/20/2022   Procedure: CORONARY STENT INTERVENTION;  Surgeon: Ammon Blunt, MD;  Location: [facility];  Service: Cardiovascular;  Laterality: N/A;   CORONARY/GRAFT ACUTE MI REVASCULARIZATION N/A 10/11/2021   Procedure: Coronary/Graft Acute MI Revascularization;  Surgeon: Mady Bruckner, MD;  Location: [facility];  Service: Cardiovascular;  Laterality: N/A;   LEFT HEART CATH AND CORONARY ANGIOGRAPHY N/A 06/10/2018   Procedure: With coronary intervention and stenting;  Surgeon: Fernand Denyse LABOR, MD;  Location: [facility] INVASIVE CV [facility];  Service:  Cardiovascular;  Laterality: N/A;   LEFT HEART CATH AND CORONARY ANGIOGRAPHY Left 06/15/2020   Procedure: LEFT HEART CATH AND CORONARY ANGIOGRAPHY;  Surgeon: Fernand Denyse LABOR, MD;  Location: [facility];  Service: Cardiovascular;  Laterality: Left;   LEFT HEART CATH AND CORONARY ANGIOGRAPHY N/A 04/14/2021   Procedure: LEFT HEART CATH AND CORONARY ANGIOGRAPHY with intervention;  Surgeon: Fernand Denyse LABOR, MD;  Location: [facility];  Service: Cardiovascular;  Laterality: N/A;   LEFT HEART CATH AND CORONARY ANGIOGRAPHY N/A 10/11/2021   Procedure: LEFT HEART CATH AND CORONARY ANGIOGRAPHY;  Surgeon: Mady Bruckner, MD;  Location: [facility];  Service: Cardiovascular;  Laterality: N/A;   LEFT HEART CATH AND CORONARY ANGIOGRAPHY N/A 01/20/2022   Procedure: LEFT HEART CATH AND CORONARY ANGIOGRAPHY;  Surgeon: Fernand Denyse LABOR, MD;  Location: [facility];  Service: Cardiovascular;  Laterality: N/A;   LEFT HEART CATH AND CORONARY ANGIOGRAPHY N/A 04/05/2022   Procedure: LEFT HEART CATH AND CORONARY ANGIOGRAPHY;  Surgeon: Dann Candyce RAMAN, MD;  Location: [facility] INVASIVE CV LAB;  Service: Cardiovascular;  Laterality: N/A;   TUBAL LIGATION     XI ROBOTIC LAPAROSCOPIC ASSISTED APPENDECTOMY N/A 01/14/2024   Procedure: APPENDECTOMY, ROBOT-ASSISTED, LAPAROSCOPIC;  Surgeon: Tye Millet, DO;  Location: [facility];  Service: General;  Laterality: N/A;     Current Outpatient Medications  Medication Sig Dispense Refill   acetaminophen  (TYLENOL ) 325 MG tablet Take 2 tablets (650 mg total) by mouth every 8 (eight) hours as needed for mild  pain (pain score 1-3). 40 tablet 0   amLODipine  (NORVASC ) 5 MG tablet TAKE 1 TABLET (5 MG TOTAL) BY MOUTH DAILY. 90 tablet 3   aspirin  (ASPIRIN  CHILDRENS) 81 MG chewable tablet Chew 1 tablet (81 mg total) by mouth daily. 30 tablet 0   atorvastatin  (LIPITOR ) 80 MG tablet Take 1 tablet (80 mg total) by mouth daily. 30 tablet 11   Bacillus  Coagulans-Inulin (PROBIOTIC) 1-250 BILLION-MG CAPS Take 1 capsule by mouth daily. 30 capsule 0   busPIRone  (BUSPAR ) 10 MG tablet Take 10 mg by mouth 2 (two) times daily as needed.     Cholecalciferol  (VITAMIN D ) 125 MCG (5000 UT) CAPS Take 5,000 Units by mouth daily.     Cyclobenzaprine HCl (FLEXERIL PO) Take 5 mg by mouth. prn     doxycycline  (VIBRA -TABS) 100 MG tablet Take 1 tablet (100 mg total) by mouth 2 (two) times daily for 5 days. 10 tablet 0   famotidine  (PEPCID ) 20 MG tablet Take 1 tablet (20 mg total) by mouth 2 (two) times daily. 60 tablet 4   isosorbide  mononitrate (IMDUR ) 120 MG 24 hr tablet Take 1 tablet (120 mg total) by mouth 2 (two) times daily. Takes half a pill twice a day     nitroGLYCERIN  (NITROSTAT ) 0.4 MG SL tablet Place 1 tablet (0.4 mg total) under the tongue every 5 (five) minutes as needed for chest pain. 30 tablet 12   nystatin cream (MYCOSTATIN) Apply 1 application topically at bedtime as needed (irritation).     oxyCODONE -acetaminophen  (PERCOCET) 5-325 MG tablet Take 1 tablet by mouth every 8 (eight) hours as needed for severe pain (pain score 7-10). 6 tablet 0   prasugrel  (EFFIENT ) 10 MG TABS tablet TAKE 1 TABLET BY MOUTH EVERY DAY 90 tablet 1   ranolazine  (RANEXA ) 1000 MG SR tablet TAKE 1 TABLET BY MOUTH TWICE A DAY 180 tablet 1   carvedilol  (COREG ) 12.5 MG tablet Take 1 tablet (12.5 mg total) by mouth 2 (two) times daily. 180 tablet 1   metroNIDAZOLE  (FLAGYL ) 500 MG tablet Take 1 tablet (500 mg total) by mouth 2 (two) times daily. 10 tablet 0   Semaglutide -Weight Management (WEGOVY ) 0.25 MG/0.5ML SOAJ Inject 0.25 mg into the skin once a week. (Patient not taking: Reported on 01/30/2024) 2 mL 3   No current facility-administered medications for this visit.    Allergies:   Penicillins, Atorvastatin  calcium , Metoprolol , Penicillins, Repatha  [evolocumab ], and Zetia  [ezetimibe ]    Social History:   reports that she quit smoking about 2 years ago. Her smoking use  included cigarettes. She started smoking about 22 years ago. She has a 10 pack-year smoking history. She has never used smokeless tobacco. She reports that she does not currently use alcohol. She reports that she does not use drugs.   Family History:  family history includes Heart attack in her father and mother.    ROS:     Review of Systems  Constitutional: Negative.   HENT: Negative.    Eyes: Negative.   Respiratory: Negative.  Negative for shortness of breath.   Cardiovascular: Negative.  Negative for chest pain.  Gastrointestinal: Negative.  Negative for abdominal pain, constipation and diarrhea.  Genitourinary: Negative.   Musculoskeletal:  Negative for joint pain and myalgias.  Skin: Negative.   Neurological: Negative.  Negative for dizziness and headaches.  Endo/Heme/Allergies: Negative.   All other systems reviewed and are negative.     All other systems are reviewed and negative.    PHYSICAL EXAM: VS:  BP 112/74   Pulse 76   Ht 5' 11 (1.803 m)   Wt 280 lb (127 kg)   SpO2 99%   BMI 39.05 kg/m  , BMI Body mass index is 39.05 kg/m. Last weight:  Wt Readings from Last 3 Encounters:  01/30/24 280 lb (127 kg)  01/14/24 280 lb (127 kg)  01/10/24 281 lb 1.4 oz (127.5 kg)     Physical Exam Vitals and nursing note reviewed.  Constitutional:      Appearance: Normal appearance. She is normal weight.  HENT:     Head: Normocephalic and atraumatic.     Nose: Nose normal.     Mouth/Throat:     Mouth: Mucous membranes are moist.     Pharynx: Oropharynx is clear.  Eyes:     Conjunctiva/sclera: Conjunctivae normal.     Pupils: Pupils are equal, round, and reactive to light.  Cardiovascular:     Rate and Rhythm: Normal rate and regular rhythm.     Pulses: Normal pulses.     Heart sounds: Normal heart sounds.  Pulmonary:     Effort: Pulmonary effort is normal.     Breath sounds: Normal breath sounds.  Abdominal:     General: Abdomen is flat. Bowel sounds are  normal.     Palpations: Abdomen is soft.  Musculoskeletal:        General: Normal range of motion.     Cervical back: Normal range of motion.  Skin:    General: Skin is warm and dry.  Neurological:     General: No focal deficit present.     Mental Status: She is alert and oriented to person, place, and time. Mental status is at baseline.  Psychiatric:        Mood and Affect: Mood normal.        Behavior: Behavior normal.     EKG: none today  Recent Labs: 12/19/2023: ALT 13; TSH 0.865 01/11/2024: BUN 7; Creatinine, Ser 0.81; Hemoglobin 10.4; Platelets 241; Potassium 3.5; Sodium 138    Lipid Panel    Component Value Date/Time   CHOL 171 12/19/2023 0958   TRIG 95 12/19/2023 0958   HDL 51 12/19/2023 0958   CHOLHDL 3.4 12/19/2023 0958   CHOLHDL 5.0 04/05/2022 0938   VLDL 12 04/05/2022 0938   LDLCALC 103 (H) 12/19/2023 0958      ASSESSMENT AND PLAN:    ICD-10-CM   1. Coronary artery disease involving native coronary artery of native heart without angina pectoris  I25.10     2. Obesity (BMI 35.0-39.9 without comorbidity)  E66.9        Problem List Items Addressed This Visit       Cardiovascular and Mediastinum   Coronary artery disease - Primary   Patient does not currently have medication insurance, was unable to get Wegovy . Patient will notify office when Medicaid is re-instated.       Relevant Medications   carvedilol  (COREG ) 12.5 MG tablet     Other   Obesity (BMI 35.0-39.9 without comorbidity)     Disposition:   Return in about 2 months (around 03/31/2024).    Total time spent: 25 minutes  Signed,  Jeoffrey Pollen, NP  01/30/2024 10:58 AM    [facility]

## 2024-01-30 NOTE — Assessment & Plan Note (Signed)
 Patient does not currently have medication insurance, was unable to get Wegovy . Patient will notify office when Medicaid is re-instated.

## 2024-03-01 ENCOUNTER — Other Ambulatory Visit: Payer: Self-pay | Admitting: Cardiology

## 2024-03-27 ENCOUNTER — Other Ambulatory Visit: Payer: Self-pay | Admitting: Physician Assistant

## 2024-03-27 DIAGNOSIS — Z1231 Encounter for screening mammogram for malignant neoplasm of breast: Secondary | ICD-10-CM

## 2024-03-27 DIAGNOSIS — M79669 Pain in unspecified lower leg: Secondary | ICD-10-CM

## 2024-04-02 ENCOUNTER — Ambulatory Visit: Admitting: Cardiology

## 2024-04-08 ENCOUNTER — Ambulatory Visit: Admitting: Cardiology

## 2024-04-09 ENCOUNTER — Ambulatory Visit

## 2024-04-16 ENCOUNTER — Ambulatory Visit
Admission: RE | Admit: 2024-04-16 | Discharge: 2024-04-16 | Disposition: A | Discharge status: Home / Self Care | Source: Ambulatory Visit | Attending: Physician Assistant | Admitting: Physician Assistant

## 2024-04-16 DIAGNOSIS — M79669 Pain in unspecified lower leg: Secondary | ICD-10-CM | POA: Insufficient documentation

## 2024-04-24 ENCOUNTER — Ambulatory Visit (INDEPENDENT_AMBULATORY_CARE_PROVIDER_SITE_OTHER): Admitting: Cardiology

## 2024-04-24 ENCOUNTER — Encounter: Payer: Self-pay | Admitting: Cardiology

## 2024-04-24 ENCOUNTER — Ambulatory Visit: Admitting: Cardiology

## 2024-04-24 ENCOUNTER — Other Ambulatory Visit: Payer: Self-pay

## 2024-04-24 VITALS — BP 119/83 | HR 76 | Ht 71.0 in | Wt 274.0 lb

## 2024-04-24 DIAGNOSIS — E669 Obesity, unspecified: Secondary | ICD-10-CM

## 2024-04-24 DIAGNOSIS — R002 Palpitations: Secondary | ICD-10-CM | POA: Insufficient documentation

## 2024-04-24 DIAGNOSIS — E782 Mixed hyperlipidemia: Secondary | ICD-10-CM | POA: Diagnosis not present

## 2024-04-24 DIAGNOSIS — R7303 Prediabetes: Secondary | ICD-10-CM

## 2024-04-24 DIAGNOSIS — I10 Essential (primary) hypertension: Secondary | ICD-10-CM

## 2024-04-24 DIAGNOSIS — R5383 Other fatigue: Secondary | ICD-10-CM | POA: Insufficient documentation

## 2024-04-24 DIAGNOSIS — I2510 Atherosclerotic heart disease of native coronary artery without angina pectoris: Secondary | ICD-10-CM

## 2024-04-24 MED ORDER — WEGOVY 0.25 MG/0.5ML ~~LOC~~ SOAJ: 0.2500 mg | SUBCUTANEOUS | 0 refills | Status: DC

## 2024-04-24 NOTE — Progress Notes (Signed)
 Established Patient Office Visit  Subjective:  Patient ID: Sonya [name], female    DOB: Oct 29, 1980  Age: 43 y.o. MRN: [id number]  Chief Complaint  Patient presents with   Follow-up    2 month follow up     Patient in office for 2 month follow up. Patient doing well. Complains of occasional palpitations, becoming more frequent. Patient admits to drinking mountain dew daily. Recommend decreasing mountain dew intake and increasing water intake. Due to patient's cardiac history, will order a 14 day cardiac event monitor. Patient also complaining of increased fatigue, will order a vitamin d  level.  Patient fasting today, will get labs.  Pap smear 04/01/23 at [facility] drew was negative. Up to date on mammogram. Patient declines flu vaccine today. Patient has not started Wegovy , waiting on insurance approval on [place].    No other concerns at this time.   Past Medical History:  Diagnosis Date   Coronary artery disease    GERD (gastroesophageal reflux disease)    H/O heart artery stent    Hyperlipidemia    Hypertension    Myocardial infarction (HCC)    Tobacco abuse     Past Surgical History:  Procedure Laterality Date   CORONARY STENT INTERVENTION N/A 06/10/2018   Procedure: CORONARY STENT INTERVENTION;  Surgeon: Florencio Cara BIRCH, MD;  Location: [facility];  Service: Cardiovascular;  Laterality: N/A;   CORONARY STENT INTERVENTION N/A 04/14/2021   Procedure: CORONARY STENT INTERVENTION;  Surgeon: Ammon Blunt, MD;  Location: [facility];  Service: Cardiovascular;  Laterality: N/A;   CORONARY STENT INTERVENTION N/A 01/20/2022   Procedure: CORONARY STENT INTERVENTION;  Surgeon: Ammon Blunt, MD;  Location: [facility];  Service: Cardiovascular;  Laterality: N/A;   CORONARY/GRAFT ACUTE MI REVASCULARIZATION N/A 10/11/2021   Procedure: Coronary/Graft Acute MI Revascularization;  Surgeon: Mady Bruckner, MD;  Location: [facility];   Service: Cardiovascular;  Laterality: N/A;   LEFT HEART CATH AND CORONARY ANGIOGRAPHY N/A 06/10/2018   Procedure: With coronary intervention and stenting;  Surgeon: Fernand Denyse LABOR, MD;  Location: [facility] INVASIVE CV LAB;  Service: Cardiovascular;  Laterality: N/A;   LEFT HEART CATH AND CORONARY ANGIOGRAPHY Left 06/15/2020   Procedure: LEFT HEART CATH AND CORONARY ANGIOGRAPHY;  Surgeon: Fernand Denyse LABOR, MD;  Location: [facility];  Service: Cardiovascular;  Laterality: Left;   LEFT HEART CATH AND CORONARY ANGIOGRAPHY N/A 04/14/2021   Procedure: LEFT HEART CATH AND CORONARY ANGIOGRAPHY with intervention;  Surgeon: Fernand Denyse LABOR, MD;  Location: [facility];  Service: Cardiovascular;  Laterality: N/A;   LEFT HEART CATH AND CORONARY ANGIOGRAPHY N/A 10/11/2021   Procedure: LEFT HEART CATH AND CORONARY ANGIOGRAPHY;  Surgeon: Mady Bruckner, MD;  Location: [facility];  Service: Cardiovascular;  Laterality: N/A;   LEFT HEART CATH AND CORONARY ANGIOGRAPHY N/A 01/20/2022   Procedure: LEFT HEART CATH AND CORONARY ANGIOGRAPHY;  Surgeon: Fernand Denyse LABOR, MD;  Location: [facility];  Service: Cardiovascular;  Laterality: N/A;   LEFT HEART CATH AND CORONARY ANGIOGRAPHY N/A 04/05/2022   Procedure: LEFT HEART CATH AND CORONARY ANGIOGRAPHY;  Surgeon: Dann Candyce RAMAN, MD;  Location: [facility] INVASIVE CV LAB;  Service: Cardiovascular;  Laterality: N/A;   TUBAL LIGATION     XI ROBOTIC LAPAROSCOPIC ASSISTED APPENDECTOMY N/A 01/14/2024   Procedure: APPENDECTOMY, ROBOT-ASSISTED, LAPAROSCOPIC;  Surgeon: Tye Millet, DO;  Location: [facility];  Service: General;  Laterality: N/A;    Social History   Socioeconomic History  Marital status: Single    Spouse name: Not on file   Number of children: 4   Years of education: Not on file   Highest education level: Not on file  Occupational History   Not on file  Tobacco Use   Smoking status: Former    Current packs/day: 0.00    Average  packs/day: 0.5 packs/day for 20.0 years (10.0 ttl pk-yrs)    Types: Cigarettes    Start date: 03/2001    Quit date: 03/2021    Years since quitting: 3.0   Smokeless tobacco: Never  Vaping Use   Vaping status: Former  Substance and Sexual Activity   Alcohol use: Not Currently    Comment: occasionally   Drug use: Never   Sexual activity: Yes  Other Topics Concern   Not on file  Social History Narrative   ** Merged History Encounter **       Lives at home with all her children   Social Drivers of Corporate Investment Banker Strain: Not on file  Food Insecurity: No Food Insecurity (01/10/2024)   Hunger Vital Sign    Worried About Running Out of Food in the Last Year: Never true    Ran Out of Food in the Last Year: Never true  Transportation Needs: No Transportation Needs (01/10/2024)   PRAPARE - Administrator, Civil Service (Medical): No    Lack of Transportation (Non-Medical): No  Physical Activity: Not on file  Stress: Not on file  Social Connections: Not on file  Intimate Partner Violence: Unknown (01/10/2024)   Humiliation, Afraid, Rape, and Kick questionnaire    Fear of Current or Ex-Partner: No    Emotionally Abused: No    Physically Abused: No    Sexually Abused: Not on file    Family History  Problem Relation Age of Onset   Heart attack Mother    Heart attack Father     Allergies  Allergen Reactions   Penicillins Hives   Atorvastatin  Calcium  Other (See Comments)    myalgias   Metoprolol  Hives   Penicillins Other (See Comments)   Repatha  [Evolocumab ]    Zetia  [Ezetimibe ] Rash    Outpatient Medications Prior to Visit  Medication Sig   amLODipine  (NORVASC ) 5 MG tablet TAKE 1 TABLET (5 MG TOTAL) BY MOUTH DAILY.   aspirin  (ASPIRIN  CHILDRENS) 81 MG chewable tablet Chew 1 tablet (81 mg total) by mouth daily.   atorvastatin  (LIPITOR ) 80 MG tablet Take 1 tablet (80 mg total) by mouth daily.   Bacillus Coagulans-Inulin (PROBIOTIC) 1-250 BILLION-MG  CAPS Take 1 capsule by mouth daily.   busPIRone  (BUSPAR ) 10 MG tablet Take 10 mg by mouth 2 (two) times daily as needed.   carvedilol  (COREG ) 12.5 MG tablet Take 1 tablet (12.5 mg total) by mouth 2 (two) times daily.   Cholecalciferol  (VITAMIN D ) 125 MCG (5000 UT) CAPS Take 5,000 Units by mouth daily.   Cyclobenzaprine HCl (FLEXERIL PO) Take 5 mg by mouth. prn   famotidine  (PEPCID ) 20 MG tablet Take 1 tablet (20 mg total) by mouth 2 (two) times daily.   isosorbide  mononitrate (IMDUR ) 120 MG 24 hr tablet Take 1 tablet (120 mg total) by mouth 2 (two) times daily. Takes half a pill twice a day   metroNIDAZOLE  (FLAGYL ) 500 MG tablet Take 1 tablet (500 mg total) by mouth 2 (two) times daily.   nitroGLYCERIN  (NITROSTAT ) 0.4 MG SL tablet Place 1 tablet (0.4 mg total) under the tongue every 5 (five)  minutes as needed for chest pain.   nystatin cream (MYCOSTATIN) Apply 1 application topically at bedtime as needed (irritation).   oxyCODONE -acetaminophen  (PERCOCET) 5-325 MG tablet Take 1 tablet by mouth every 8 (eight) hours as needed for severe pain (pain score 7-10).   prasugrel  (EFFIENT ) 10 MG TABS tablet TAKE 1 TABLET BY MOUTH EVERY DAY   ranolazine  (RANEXA ) 1000 MG SR tablet TAKE 1 TABLET BY MOUTH TWICE A DAY   [DISCONTINUED] Semaglutide -Weight Management (WEGOVY ) 0.25 MG/0.5ML SOAJ Inject 0.25 mg into the skin once a week. (Patient not taking: Reported on 04/24/2024)   No facility-administered medications prior to visit.    Review of Systems  Constitutional: Negative.   HENT: Negative.    Eyes: Negative.   Respiratory: Negative.  Negative for shortness of breath.   Cardiovascular:  Positive for palpitations. Negative for chest pain.  Gastrointestinal: Negative.  Negative for abdominal pain, constipation and diarrhea.  Genitourinary: Negative.   Musculoskeletal:  Negative for joint pain and myalgias.  Skin: Negative.   Neurological: Negative.  Negative for dizziness and headaches.   Endo/Heme/Allergies: Negative.   All other systems reviewed and are negative.      Objective:   BP 119/83   Pulse 76   Ht 5' 11 (1.803 m)   Wt 274 lb (124.3 kg)   SpO2 98%   BMI 38.22 kg/m   Vitals:   04/24/24 0959  BP: 119/83  Pulse: 76  Height: 5' 11 (1.803 m)  Weight: 274 lb (124.3 kg)  SpO2: 98%  BMI (Calculated): 38.23    Physical Exam Vitals and nursing note reviewed.  Constitutional:      Appearance: Normal appearance. She is normal weight.  HENT:     Head: Normocephalic and atraumatic.     Nose: Nose normal.     Mouth/Throat:     Mouth: Mucous membranes are moist.  Eyes:     Extraocular Movements: Extraocular movements intact.     Conjunctiva/sclera: Conjunctivae normal.     Pupils: Pupils are equal, round, and reactive to light.  Cardiovascular:     Rate and Rhythm: Normal rate and regular rhythm.     Pulses: Normal pulses.     Heart sounds: Normal heart sounds.  Pulmonary:     Effort: Pulmonary effort is normal.     Breath sounds: Normal breath sounds.  Abdominal:     General: Abdomen is flat. Bowel sounds are normal.     Palpations: Abdomen is soft.  Musculoskeletal:        General: Normal range of motion.     Cervical back: Normal range of motion.  Skin:    General: Skin is warm and dry.  Neurological:     General: No focal deficit present.     Mental Status: She is alert and oriented to person, place, and time.  Psychiatric:        Mood and Affect: Mood normal.        Behavior: Behavior normal.        Thought Content: Thought content normal.        Judgment: Judgment normal.      No results found for any visits on 04/24/24.  No results found for this or any previous visit (from the past 2160 hours).    Assessment & Plan:  Decrease mountain dew intake Increase water intake Cardiac event monitor Fasting lab work today  Problem List Items Addressed This Visit       Cardiovascular and Mediastinum   HTN (hypertension) -  Primary   Relevant Orders   CMP14+EGFR     Other   HLD (hyperlipidemia)   Relevant Orders   Lipid Profile   Prediabetes   Relevant Orders   Hemoglobin A1c   Palpitations   Relevant Orders   CMP14+EGFR   TSH   CARDIAC EVENT MONITOR   Fatigue   Relevant Orders   TSH   Vitamin D  (25 hydroxy)    Return in about 4 weeks (around 05/22/2024) for after event monitor.   Total time spent: 25 minutes. This time includes review of previous notes and results and patient face to face interaction during today's visit.    Jeoffrey Pollen, NP  04/24/2024   This document may have been prepared by [employer] Voice Recognition software and as such may include unintentional dictation errors.

## 2024-04-25 LAB — HEMOGLOBIN A1C
Est. average glucose Bld gHb Est-mCnc: 120 mg/dL
Hgb A1c MFr Bld: 5.8 % — ABNORMAL HIGH (ref 4.8–5.6)

## 2024-04-25 LAB — CMP14+EGFR
ALT: 10 IU/L (ref 0–32)
AST: 12 IU/L (ref 0–40)
Albumin: 4 g/dL (ref 3.9–4.9)
Alkaline Phosphatase: 76 IU/L (ref 41–116)
BUN/Creatinine Ratio: 13 (ref 9–23)
BUN: 12 mg/dL (ref 6–24)
Bilirubin Total: <0.2 mg/dL (ref 0.0–1.2)
CO2: 18 mmol/L — ABNORMAL LOW (ref 20–29)
Calcium: 9.5 mg/dL (ref 8.7–10.2)
Chloride: 104 mmol/L (ref 96–106)
Creatinine, Ser: 0.95 mg/dL (ref 0.57–1.00)
Globulin, Total: 2.8 g/dL (ref 1.5–4.5)
Glucose: 111 mg/dL — ABNORMAL HIGH (ref 70–99)
Potassium: 3.8 mmol/L (ref 3.5–5.2)
Sodium: 139 mmol/L (ref 134–144)
Total Protein: 6.8 g/dL (ref 6.0–8.5)
eGFR: 76 mL/min/1.73 (ref >59)

## 2024-04-25 LAB — LIPID PANEL
Chol/HDL Ratio: 5.2 ratio — ABNORMAL HIGH (ref 0.0–4.4)
Cholesterol, Total: 262 mg/dL — ABNORMAL HIGH (ref 100–199)
HDL: 50 mg/dL (ref >39)
LDL Chol Calc (NIH): 188 mg/dL — ABNORMAL HIGH (ref 0–99)
Triglycerides: 130 mg/dL (ref 0–149)
VLDL Cholesterol Cal: 24 mg/dL (ref 5–40)

## 2024-04-25 LAB — VITAMIN D 25 HYDROXY (VIT D DEFICIENCY, FRACTURES): Vit D, 25-Hydroxy: 28.1 ng/mL — ABNORMAL LOW (ref 30.0–100.0)

## 2024-04-25 LAB — TSH: TSH: 0.55 u[IU]/mL (ref 0.450–4.500)

## 2024-04-28 ENCOUNTER — Ambulatory Visit: Payer: Self-pay | Admitting: Cardiology

## 2024-04-28 ENCOUNTER — Ambulatory Visit (INDEPENDENT_AMBULATORY_CARE_PROVIDER_SITE_OTHER)

## 2024-04-28 ENCOUNTER — Other Ambulatory Visit: Payer: Self-pay | Admitting: Cardiology

## 2024-04-28 DIAGNOSIS — R002 Palpitations: Secondary | ICD-10-CM

## 2024-04-28 MED ORDER — PRALUENT 150 MG/ML ~~LOC~~ SOAJ: 150.0000 mg | SUBCUTANEOUS | 2 refills | Status: AC

## 2024-05-05 ENCOUNTER — Other Ambulatory Visit: Payer: Self-pay | Admitting: Cardiology

## 2024-05-26 ENCOUNTER — Other Ambulatory Visit: Payer: Self-pay | Admitting: Cardiovascular Disease

## 2024-05-26 ENCOUNTER — Other Ambulatory Visit: Payer: Self-pay | Admitting: Cardiology

## 2024-05-26 DIAGNOSIS — I2510 Atherosclerotic heart disease of native coronary artery without angina pectoris: Secondary | ICD-10-CM

## 2024-05-26 DIAGNOSIS — R7303 Prediabetes: Secondary | ICD-10-CM

## 2024-05-26 DIAGNOSIS — E669 Obesity, unspecified: Secondary | ICD-10-CM

## 2024-05-27 ENCOUNTER — Ambulatory Visit: Admitting: Cardiovascular Disease

## 2024-05-27 ENCOUNTER — Encounter: Payer: Self-pay | Admitting: Cardiovascular Disease

## 2024-05-27 VITALS — BP 130/78 | HR 88 | Ht 71.0 in | Wt 267.0 lb

## 2024-05-27 DIAGNOSIS — I10 Essential (primary) hypertension: Secondary | ICD-10-CM

## 2024-05-27 DIAGNOSIS — R002 Palpitations: Secondary | ICD-10-CM

## 2024-05-27 DIAGNOSIS — I2510 Atherosclerotic heart disease of native coronary artery without angina pectoris: Secondary | ICD-10-CM | POA: Diagnosis not present

## 2024-05-27 DIAGNOSIS — E782 Mixed hyperlipidemia: Secondary | ICD-10-CM

## 2024-05-27 DIAGNOSIS — E669 Obesity, unspecified: Secondary | ICD-10-CM

## 2024-05-27 DIAGNOSIS — I4891 Unspecified atrial fibrillation: Secondary | ICD-10-CM

## 2024-05-27 DIAGNOSIS — R0789 Other chest pain: Secondary | ICD-10-CM

## 2024-05-27 MED ORDER — CLOPIDOGREL BISULFATE 75 MG PO TABS: 75.0000 mg | ORAL_TABLET | Freq: Every day | ORAL | 11 refills | Status: DC

## 2024-05-27 MED ORDER — APIXABAN 5 MG PO TABS: 5.0000 mg | ORAL_TABLET | Freq: Two times a day (BID) | ORAL | 0 refills | Status: DC

## 2024-05-27 MED ORDER — AMIODARONE HCL 200 MG PO TABS: ORAL_TABLET | ORAL | 0 refills | Status: AC

## 2024-05-27 NOTE — Addendum Note (Signed)
 Addended by: FERNAND ALTER A on: 05/27/2024 03:47 PM   Modules accepted: Orders

## 2024-05-27 NOTE — Progress Notes (Addendum)
 Cardiology Office Note   Date:  05/27/2024   ID:  Dominique L Temples, DOB 08-10-1980, MRN [id number]  PCP:  Carin Gauze, NP  Cardiologist:  Denyse Bathe, MD      History of Present Illness: Sonya [name] is a 43 y.o. female who presents for  Chief Complaint  Patient presents with   Follow-up    Holter monitor results    No chest pain or SOB.      Past Medical History:  Diagnosis Date   Coronary artery disease    GERD (gastroesophageal reflux disease)    H/O heart artery stent    Hyperlipidemia    Hypertension    Myocardial infarction (HCC)    Tobacco abuse      Past Surgical History:  Procedure Laterality Date   CORONARY STENT INTERVENTION N/A 06/10/2018   Procedure: CORONARY STENT INTERVENTION;  Surgeon: Florencio Cara BIRCH, MD;  Location: [facility];  Service: Cardiovascular;  Laterality: N/A;   CORONARY STENT INTERVENTION N/A 04/14/2021   Procedure: CORONARY STENT INTERVENTION;  Surgeon: Ammon Blunt, MD;  Location: [facility];  Service: Cardiovascular;  Laterality: N/A;   CORONARY STENT INTERVENTION N/A 01/20/2022   Procedure: CORONARY STENT INTERVENTION;  Surgeon: Ammon Blunt, MD;  Location: [facility];  Service: Cardiovascular;  Laterality: N/A;   CORONARY/GRAFT ACUTE MI REVASCULARIZATION N/A 10/11/2021   Procedure: Coronary/Graft Acute MI Revascularization;  Surgeon: Mady Bruckner, MD;  Location: [facility];  Service: Cardiovascular;  Laterality: N/A;   LEFT HEART CATH AND CORONARY ANGIOGRAPHY N/A 06/10/2018   Procedure: With coronary intervention and stenting;  Surgeon: Bathe Denyse LABOR, MD;  Location: [facility] CV [facility];  Service: Cardiovascular;  Laterality: N/A;   LEFT HEART CATH AND CORONARY ANGIOGRAPHY Left 06/15/2020   Procedure: LEFT HEART CATH AND CORONARY ANGIOGRAPHY;  Surgeon: Bathe Denyse LABOR, MD;  Location: [facility];  Service: Cardiovascular;  Laterality: Left;   LEFT  HEART CATH AND CORONARY ANGIOGRAPHY N/A 04/14/2021   Procedure: LEFT HEART CATH AND CORONARY ANGIOGRAPHY with intervention;  Surgeon: Bathe Denyse LABOR, MD;  Location: [facility];  Service: Cardiovascular;  Laterality: N/A;   LEFT HEART CATH AND CORONARY ANGIOGRAPHY N/A 10/11/2021   Procedure: LEFT HEART CATH AND CORONARY ANGIOGRAPHY;  Surgeon: Mady Bruckner, MD;  Location: [facility];  Service: Cardiovascular;  Laterality: N/A;   LEFT HEART CATH AND CORONARY ANGIOGRAPHY N/A 01/20/2022   Procedure: LEFT HEART CATH AND CORONARY ANGIOGRAPHY;  Surgeon: Bathe Denyse LABOR, MD;  Location: [facility];  Service: Cardiovascular;  Laterality: N/A;   LEFT HEART CATH AND CORONARY ANGIOGRAPHY N/A 04/05/2022   Procedure: LEFT HEART CATH AND CORONARY ANGIOGRAPHY;  Surgeon: Dann Candyce RAMAN, MD;  Location: [facility] INVASIVE CV LAB;  Service: Cardiovascular;  Laterality: N/A;   TUBAL LIGATION     XI ROBOTIC LAPAROSCOPIC ASSISTED APPENDECTOMY N/A 01/14/2024   Procedure: APPENDECTOMY, ROBOT-ASSISTED, LAPAROSCOPIC;  Surgeon: Tye Millet, DO;  Location: [facility];  Service: General;  Laterality: N/A;     Current Outpatient Medications  Medication Sig Dispense Refill   amiodarone (PACERONE) 200 MG tablet Take 4 tablets/day for 1 week, then 3 tablets/day for 1 week, then 2 tablets/day for 1 week. Then follow up. 70 tablet 0   apixaban (ELIQUIS) 5 MG TABS tablet Take 1 tablet (5 mg total) by mouth 2 (two) times daily. 60 tablet 0   clopidogrel  (PLAVIX ) 75 MG tablet Take 1 tablet (75 mg total) by  mouth daily. 30 tablet 11   Alirocumab  (PRALUENT ) 150 MG/ML SOAJ Inject 1 mL (150 mg total) into the skin every 14 (fourteen) days. 2 mL 2   amLODipine  (NORVASC ) 5 MG tablet TAKE 1 TABLET (5 MG TOTAL) BY MOUTH DAILY. 90 tablet 3   atorvastatin  (LIPITOR ) 80 MG tablet Take 1 tablet (80 mg total) by mouth daily. 30 tablet 11   Bacillus Coagulans-Inulin (PROBIOTIC) 1-250 BILLION-MG CAPS Take 1 capsule by  mouth daily. 30 capsule 0   busPIRone  (BUSPAR ) 10 MG tablet Take 10 mg by mouth 2 (two) times daily as needed.     carvedilol  (COREG ) 12.5 MG tablet Take 1 tablet (12.5 mg total) by mouth 2 (two) times daily. 180 tablet 1   Cholecalciferol  (VITAMIN D ) 125 MCG (5000 UT) CAPS Take 5,000 Units by mouth daily.     Cyclobenzaprine HCl (FLEXERIL PO) Take 5 mg by mouth. prn     famotidine  (PEPCID ) 20 MG tablet Take 1 tablet (20 mg total) by mouth 2 (two) times daily. 60 tablet 4   isosorbide  mononitrate (IMDUR ) 120 MG 24 hr tablet Take 1 tablet (120 mg total) by mouth 2 (two) times daily. Takes half a pill twice a day     metroNIDAZOLE  (FLAGYL ) 500 MG tablet Take 1 tablet (500 mg total) by mouth 2 (two) times daily. 10 tablet 0   nitroGLYCERIN  (NITROSTAT ) 0.4 MG SL tablet Place 1 tablet (0.4 mg total) under the tongue every 5 (five) minutes as needed for chest pain. 30 tablet 12   nystatin cream (MYCOSTATIN) Apply 1 application topically at bedtime as needed (irritation).     oxyCODONE -acetaminophen  (PERCOCET) 5-325 MG tablet Take 1 tablet by mouth every 8 (eight) hours as needed for severe pain (pain score 7-10). 6 tablet 0   ranolazine  (RANEXA ) 1000 MG SR tablet TAKE 1 TABLET BY MOUTH TWICE A DAY 180 tablet 1   semaglutide -weight management (WEGOVY ) 0.25 MG/0.5ML SOAJ SQ injection INJECT 0.25MG  INTO THE SKIN ONE TIME PER WEEK 2 mL 4   No current facility-administered medications for this visit.    Allergies:   Penicillins, Atorvastatin  calcium , Metoprolol , Penicillins, Repatha  [evolocumab ], and Zetia  [ezetimibe ]    Social History:   reports that she quit smoking about 3 years ago. Her smoking use included cigarettes. She started smoking about 23 years ago. She has a 10 pack-year smoking history. She has never used smokeless tobacco. She reports that she does not currently use alcohol. She reports that she does not use drugs.   Family History:  family history includes Heart attack in her father and  mother.    ROS:     Review of Systems  Constitutional: Negative.   HENT: Negative.    Eyes: Negative.   Respiratory: Negative.    Gastrointestinal: Negative.   Genitourinary: Negative.   Musculoskeletal: Negative.   Skin: Negative.   Neurological: Negative.   Endo/Heme/Allergies: Negative.   Psychiatric/Behavioral: Negative.    All other systems reviewed and are negative.     All other systems are reviewed and negative.    PHYSICAL EXAM: VS:  BP 130/78   Pulse 88   Ht 5' 11 (1.803 m)   Wt 267 lb (121.1 kg)   SpO2 98%   BMI 37.24 kg/m  , BMI Body mass index is 37.24 kg/m. Last weight:  Wt Readings from Last 3 Encounters:  05/27/24 267 lb (121.1 kg)  04/24/24 274 lb (124.3 kg)  01/30/24 280 lb (127 kg)     Physical Exam  Constitutional:      Appearance: Normal appearance.  Cardiovascular:     Rate and Rhythm: Normal rate and regular rhythm.     Heart sounds: Normal heart sounds.  Pulmonary:     Effort: Pulmonary effort is normal.     Breath sounds: Normal breath sounds.  Musculoskeletal:     Right lower leg: No edema.     Left lower leg: No edema.  Neurological:     Mental Status: She is alert.       EKG:   Recent Labs: 01/11/2024: Hemoglobin 10.4; Platelets 241 04/24/2024: ALT 10; BUN 12; Creatinine, Ser 0.95; Potassium 3.8; Sodium 139; TSH 0.550    Lipid Panel    Component Value Date/Time   CHOL 262 (H) 04/24/2024 1040   TRIG 130 04/24/2024 1040   HDL 50 04/24/2024 1040   CHOLHDL 5.2 (H) 04/24/2024 1040   CHOLHDL 5.0 04/05/2022 0938   VLDL 12 04/05/2022 0938   LDLCALC 188 (H) 04/24/2024 1040      Other studies Reviewed: Additional studies/ records that were reviewed today include:  Review of the above records demonstrates:      11/07/2022    3:20 PM  PAD Screen  Previous PAD dx? No  Previous surgical procedure? No  Pain with walking? No  Feet/toe relief with dangling? No  Painful, non-healing ulcers? No  Extremities discolored?  No      ASSESSMENT AND PLAN:    ICD-10-CM   1. Palpitations  R00.2 apixaban (ELIQUIS) 5 MG TABS tablet    MYOCARDIAL PERFUSION IMAGING    amiodarone (PACERONE) 200 MG tablet    PCV ECHOCARDIOGRAM COMPLETE    clopidogrel  (PLAVIX ) 75 MG tablet    2. Primary hypertension  I10 apixaban (ELIQUIS) 5 MG TABS tablet    MYOCARDIAL PERFUSION IMAGING    amiodarone (PACERONE) 200 MG tablet    PCV ECHOCARDIOGRAM COMPLETE    clopidogrel  (PLAVIX ) 75 MG tablet    3. Mixed hyperlipidemia  E78.2 apixaban (ELIQUIS) 5 MG TABS tablet    MYOCARDIAL PERFUSION IMAGING    amiodarone (PACERONE) 200 MG tablet    PCV ECHOCARDIOGRAM COMPLETE    clopidogrel  (PLAVIX ) 75 MG tablet    4. Coronary artery disease involving native coronary artery of native heart without angina pectoris  I25.10 apixaban (ELIQUIS) 5 MG TABS tablet    MYOCARDIAL PERFUSION IMAGING    amiodarone (PACERONE) 200 MG tablet    PCV ECHOCARDIOGRAM COMPLETE    clopidogrel  (PLAVIX ) 75 MG tablet    5. Obesity (BMI 35.0-39.9 without comorbidity)  E66.9 apixaban (ELIQUIS) 5 MG TABS tablet    MYOCARDIAL PERFUSION IMAGING    amiodarone (PACERONE) 200 MG tablet    PCV ECHOCARDIOGRAM COMPLETE    clopidogrel  (PLAVIX ) 75 MG tablet    6. Other chest pain  R07.89 apixaban (ELIQUIS) 5 MG TABS tablet    MYOCARDIAL PERFUSION IMAGING    amiodarone (PACERONE) 200 MG tablet    PCV ECHOCARDIOGRAM COMPLETE    clopidogrel  (PLAVIX ) 75 MG tablet    7. Atrial fibrillation, new onset (HCC)  I48.91 apixaban (ELIQUIS) 5 MG TABS tablet    MYOCARDIAL PERFUSION IMAGING    amiodarone (PACERONE) 200 MG tablet    PCV ECHOCARDIOGRAM COMPLETE    clopidogrel  (PLAVIX ) 75 MG tablet       Problem List Items Addressed This Visit       Cardiovascular and Mediastinum   Coronary artery disease   Relevant Medications   apixaban (ELIQUIS) 5 MG TABS tablet   amiodarone (  PACERONE ) 200 MG tablet   clopidogrel  (PLAVIX ) 75 MG tablet   Other Relevant Orders    MYOCARDIAL PERFUSION IMAGING   PCV ECHOCARDIOGRAM COMPLETE   HTN (hypertension)   Relevant Medications   apixaban  (ELIQUIS ) 5 MG TABS tablet   amiodarone  (PACERONE ) 200 MG tablet   clopidogrel  (PLAVIX ) 75 MG tablet   Other Relevant Orders   MYOCARDIAL PERFUSION IMAGING   PCV ECHOCARDIOGRAM COMPLETE     Other   HLD (hyperlipidemia)   Relevant Medications   apixaban  (ELIQUIS ) 5 MG TABS tablet   amiodarone  (PACERONE ) 200 MG tablet   clopidogrel  (PLAVIX ) 75 MG tablet   Other Relevant Orders   MYOCARDIAL PERFUSION IMAGING   PCV ECHOCARDIOGRAM COMPLETE   Chest pain   Relevant Medications   apixaban  (ELIQUIS ) 5 MG TABS tablet   amiodarone  (PACERONE ) 200 MG tablet   clopidogrel  (PLAVIX ) 75 MG tablet   Other Relevant Orders   MYOCARDIAL PERFUSION IMAGING   PCV ECHOCARDIOGRAM COMPLETE   Obesity (BMI 35.0-39.9 without comorbidity)   Relevant Medications   apixaban  (ELIQUIS ) 5 MG TABS tablet   amiodarone  (PACERONE ) 200 MG tablet   clopidogrel  (PLAVIX ) 75 MG tablet   Other Relevant Orders   MYOCARDIAL PERFUSION IMAGING   PCV ECHOCARDIOGRAM COMPLETE   Palpitations - Primary   Relevant Medications   apixaban  (ELIQUIS ) 5 MG TABS tablet   amiodarone  (PACERONE ) 200 MG tablet   clopidogrel  (PLAVIX ) 75 MG tablet   Other Relevant Orders   MYOCARDIAL PERFUSION IMAGING   PCV ECHOCARDIOGRAM COMPLETE   Other Visit Diagnoses       Atrial fibrillation, new onset (HCC)       Relevant Medications   apixaban  (ELIQUIS ) 5 MG TABS tablet   amiodarone  (PACERONE ) 200 MG tablet   clopidogrel  (PLAVIX ) 75 MG tablet   Other Relevant Orders   MYOCARDIAL PERFUSION IMAGING   PCV ECHOCARDIOGRAM COMPLETE          Disposition:   Return in about 4 weeks (around 06/24/2024) for echo,stress test and f/u.    Total time spent: 40 minutes  Signed,  Denyse Bathe, MD  05/27/2024 3:47 PM    [facility]

## 2024-06-20 ENCOUNTER — Other Ambulatory Visit

## 2024-07-03 ENCOUNTER — Encounter

## 2024-07-08 ENCOUNTER — Ambulatory Visit: Admitting: Cardiovascular Disease

## 2024-07-09 ENCOUNTER — Ambulatory Visit

## 2024-07-09 DIAGNOSIS — R002 Palpitations: Secondary | ICD-10-CM

## 2024-07-09 DIAGNOSIS — R0789 Other chest pain: Secondary | ICD-10-CM

## 2024-07-09 DIAGNOSIS — I517 Cardiomegaly: Secondary | ICD-10-CM

## 2024-07-09 DIAGNOSIS — I2510 Atherosclerotic heart disease of native coronary artery without angina pectoris: Secondary | ICD-10-CM

## 2024-07-09 DIAGNOSIS — I10 Essential (primary) hypertension: Secondary | ICD-10-CM

## 2024-07-09 DIAGNOSIS — I351 Nonrheumatic aortic (valve) insufficiency: Secondary | ICD-10-CM

## 2024-07-09 DIAGNOSIS — E669 Obesity, unspecified: Secondary | ICD-10-CM

## 2024-07-09 DIAGNOSIS — I4891 Unspecified atrial fibrillation: Secondary | ICD-10-CM

## 2024-07-09 DIAGNOSIS — E782 Mixed hyperlipidemia: Secondary | ICD-10-CM

## 2024-07-10 ENCOUNTER — Ambulatory Visit
Admission: RE | Admit: 2024-07-10 | Discharge: 2024-07-10 | Disposition: A | Discharge status: Home / Self Care | Source: Ambulatory Visit | Attending: Physician Assistant | Admitting: Physician Assistant

## 2024-07-10 DIAGNOSIS — Z1231 Encounter for screening mammogram for malignant neoplasm of breast: Secondary | ICD-10-CM | POA: Diagnosis present

## 2024-07-11 ENCOUNTER — Emergency Department

## 2024-07-11 ENCOUNTER — Emergency Department: Admission: EM | Admit: 2024-07-11 | Discharge: 2024-07-12 | Disposition: A | Discharge status: Home / Self Care

## 2024-07-11 ENCOUNTER — Other Ambulatory Visit: Payer: Self-pay

## 2024-07-11 DIAGNOSIS — I2510 Atherosclerotic heart disease of native coronary artery without angina pectoris: Secondary | ICD-10-CM | POA: Insufficient documentation

## 2024-07-11 DIAGNOSIS — R0789 Other chest pain: Secondary | ICD-10-CM | POA: Diagnosis present

## 2024-07-11 DIAGNOSIS — R079 Chest pain, unspecified: Secondary | ICD-10-CM

## 2024-07-11 DIAGNOSIS — E876 Hypokalemia: Secondary | ICD-10-CM | POA: Insufficient documentation

## 2024-07-11 DIAGNOSIS — D72829 Elevated white blood cell count, unspecified: Secondary | ICD-10-CM | POA: Insufficient documentation

## 2024-07-11 LAB — CBC
HCT: 41.6 % (ref 36.0–46.0)
Hemoglobin: 13.8 g/dL (ref 12.0–15.0)
MCH: 27.2 pg (ref 26.0–34.0)
MCHC: 33.2 g/dL (ref 30.0–36.0)
MCV: 81.9 fL (ref 80.0–100.0)
Platelets: 272 K/uL (ref 150–400)
RBC: 5.08 MIL/uL (ref 3.87–5.11)
RDW: 17.9 % — ABNORMAL HIGH (ref 11.5–15.5)
WBC: 11.6 K/uL — ABNORMAL HIGH (ref 4.0–10.5)
nRBC: 0 % (ref 0.0–0.2)

## 2024-07-11 LAB — COMPREHENSIVE METABOLIC PANEL WITH GFR
ALT: 13 U/L (ref 0–44)
AST: 15 U/L (ref 15–41)
Albumin: 4.4 g/dL (ref 3.5–5.0)
Alkaline Phosphatase: 66 U/L (ref 38–126)
Anion gap: 14 (ref 5–15)
BUN: 15 mg/dL (ref 6–20)
CO2: 21 mmol/L — ABNORMAL LOW (ref 22–32)
Calcium: 9.2 mg/dL (ref 8.9–10.3)
Chloride: 104 mmol/L (ref 98–111)
Creatinine, Ser: 1.04 mg/dL — ABNORMAL HIGH (ref 0.44–1.00)
GFR, Estimated: >60 mL/min
Glucose, Bld: 105 mg/dL — ABNORMAL HIGH (ref 70–99)
Potassium: 3.4 mmol/L — ABNORMAL LOW (ref 3.5–5.1)
Sodium: 139 mmol/L (ref 135–145)
Total Bilirubin: 0.3 mg/dL (ref 0.0–1.2)
Total Protein: 7.7 g/dL (ref 6.5–8.1)

## 2024-07-11 LAB — TROPONIN T, HIGH SENSITIVITY: Troponin T High Sensitivity: <15 ng/L (ref 0–19)

## 2024-07-11 LAB — PROTIME-INR
INR: 1.1 (ref 0.8–1.2)
Prothrombin Time: 14.5 s (ref 11.4–15.2)

## 2024-07-11 MED ORDER — NITROGLYCERIN 0.4 MG SL SUBL
0.4000 mg | SUBLINGUAL_TABLET | SUBLINGUAL | Status: DC | PRN
  Administered 2024-07-12: 0.4 mg via SUBLINGUAL
  Filled 2024-07-11: qty 1

## 2024-07-11 MED ORDER — ASPIRIN 81 MG PO CHEW
324.0000 mg | CHEWABLE_TABLET | Freq: Once | ORAL | Status: AC
  Administered 2024-07-11: 324 mg via ORAL
  Filled 2024-07-11: qty 4

## 2024-07-11 NOTE — ED Triage Notes (Signed)
 L sided cp described as pressure and tightness with mild tingling to L chest and face. Onset 2 hour pta. Cardiac hx with 4 stents placed. Reports compliance with all meds. No neuro deficits noted other than reported mild tingling. Pt alert and oriented following commands. Denies vision change. Reports mild dizziness and denies h/a. Pt ambulatory. Breathing unlabored and speaking in full sentences. Symmetric chest rise and fall.

## 2024-07-11 NOTE — ED Provider Notes (Signed)
 "  [facility] Provider Note    Event Date/Time   First MD Initiated Contact with Patient 07/11/24 2310     (approximate)   History   Chest Pain   HPI  Sonya [name] is a 44 y.o. female with history of coronary artery disease presenting today with concern of chest pain.  Today while at work developed sudden onset left-sided chest pain with radiation up her left neck.  No affiliated shortness of breath with symptoms, continued throughout the day afterwards causing her to present at this time.  She states she did not have her nitroglycerin  with her so she has not tried taking anything to help with her symptoms as of yet.  Denies any associated fevers chills lightheadedness or other symptomatology.  She did see her cardiologist a few days prior with plan for outpatient stress testing to be done.  Denies any other complaints at this time.     Physical Exam   Triage Vital Signs: ED Triage Vitals  Encounter Vitals Group     BP 07/11/24 2211 (!) 145/94     Girls Systolic BP Percentile --      Girls Diastolic BP Percentile --      Boys Systolic BP Percentile --      Boys Diastolic BP Percentile --      Pulse Rate 07/11/24 2211 75     Resp 07/11/24 2211 20     Temp 07/11/24 2211 98 F (36.7 C)     Temp Source 07/11/24 2211 Oral     SpO2 07/11/24 2211 100 %     Weight 07/11/24 2211 262 lb (118.8 kg)     Height 07/11/24 2211 5' 11 (1.803 m)     Head Circumference --      Peak Flow --      Pain Score 07/11/24 2210 8     Pain Loc --      Pain Education --      Exclude from Growth Chart --     Most recent vital signs: Vitals:   07/11/24 2300 07/12/24 0000  BP: (!) 135/94 127/86  Pulse: 67 68  Resp: 15 15  Temp:    SpO2: 100%      General: Awake, no distress.  CV:  Good peripheral perfusion.  Chest:  There is pain reproduced with palpation along the chest wall Resp:  Normal effort.  Abd:  No distention.  Soft nontender Other:     ED Results /  Procedures / Treatments   Labs (all labs ordered are listed, but only abnormal results are displayed) Labs Reviewed  CBC - Abnormal; Notable for the following components:      Result Value   WBC 11.6 (*)    RDW 17.9 (*)    All other components within normal limits  COMPREHENSIVE METABOLIC PANEL WITH GFR - Abnormal; Notable for the following components:   Potassium 3.4 (*)    CO2 21 (*)    Glucose, Bld 105 (*)    Creatinine, Ser 1.04 (*)    All other components within normal limits  PROTIME-INR  POC URINE PREG, ED  TROPONIN T, HIGH SENSITIVITY  TROPONIN T, HIGH SENSITIVITY     EKG  On my depend interpretation of this EKG appears to be sinus rhythm with rate of about 75, axis of 15, intervals appear to be within normal limits, there T wave inversions noted in leads III and aVF as well as leads V4, V5, V6 these were  present in prior EKG from July 2025.   RADIOLOGY On my independent interpretation of this chest x-ray no acute cardiopulmonary findings  PROCEDURES:  Critical Care performed: No  Procedures   MEDICATIONS ORDERED IN ED: Medications  nitroGLYCERIN  (NITROSTAT ) SL tablet 0.4 mg (0.4 mg Sublingual Given 07/12/24 0000)  aspirin  chewable tablet 324 mg (324 mg Oral Given 07/11/24 2359)     IMPRESSION / MDM / ASSESSMENT AND PLAN / ED COURSE  I reviewed the triage vital signs and the nursing notes.                               Patient's presentation is most consistent with acute presentation with potential threat to life or bodily function.  44 year old female with history of coronary artery disease presenting today with complaint of chest pain which began earlier tonight.  Appears to be uncomfortable, chest pain is reproducible on exam, EKG without ischemia and initial troponin here is negative.  However given the persistent nature of her chest pain and her underlying history, certainly concern for possible ACS.  I feel unlikely PE given the clinical picture, will  attempt a dose of nitroglycerin  here and give her a full dose aspirin .  Will reassess response to treatment and determine further workup accordingly.  If she continues to have chest discomfort, may warrant admission for further ACS workup.  Clinical Course as of 07/12/24 0519  Sat Jul 12, 2024  0041 Patient's pain has improved in response to nitroglycerin .  I offered admission to her given her symptoms although she does have a reassuring troponin here.  The patient would prefer to be discharged home, seems that repeat troponin is currently pending if this is reassuring then we will have her discharged with urgent outpatient cardiology follow-up. [SK]  [id number] Repeat troponin is reassuring, let the patient discharged home at this time. [SK]    Clinical Course User Index [SK] Fernand Rossie HERO, MD     FINAL CLINICAL IMPRESSION(S) / ED DIAGNOSES   Final diagnoses:  Chest pain, unspecified type     Rx / DC Orders   ED Discharge Orders          Ordered    Ambulatory referral to Cardiology       Comments: If you have not heard from the Cardiology office within the next 72 hours please call [phone].   07/12/24 0058             Note:  This document was prepared using Dragon voice recognition software and may include unintentional dictation errors.   Fernand Rossie HERO, MD 07/12/24 [phone]  "

## 2024-07-12 LAB — TROPONIN T, HIGH SENSITIVITY: Troponin T High Sensitivity: <15 ng/L (ref 0–19)

## 2024-07-12 NOTE — Discharge Instructions (Addendum)
 You were seen today due to concern of chest pain, at this time fortunately your labs are reassuring.  As we have discussed you need to follow-up with a cardiologist, they should be contacting you to arrange for a follow-up appointment.  If you have any worsening of symptoms such as chest pain shortness of breath or any other symptoms you find concerning please return to the emergency department immediately for further workup.

## 2024-07-14 ENCOUNTER — Ambulatory Visit

## 2024-07-14 DIAGNOSIS — I2510 Atherosclerotic heart disease of native coronary artery without angina pectoris: Secondary | ICD-10-CM | POA: Diagnosis not present

## 2024-07-14 DIAGNOSIS — R002 Palpitations: Secondary | ICD-10-CM

## 2024-07-14 DIAGNOSIS — R0789 Other chest pain: Secondary | ICD-10-CM | POA: Diagnosis not present

## 2024-07-14 DIAGNOSIS — E782 Mixed hyperlipidemia: Secondary | ICD-10-CM

## 2024-07-14 DIAGNOSIS — E669 Obesity, unspecified: Secondary | ICD-10-CM

## 2024-07-14 DIAGNOSIS — I10 Essential (primary) hypertension: Secondary | ICD-10-CM

## 2024-07-14 DIAGNOSIS — I4891 Unspecified atrial fibrillation: Secondary | ICD-10-CM

## 2024-07-14 MED ORDER — TECHNETIUM TC 99M SESTAMIBI GENERIC - CARDIOLITE
30.9000 | Freq: Once | INTRAVENOUS | Status: AC | PRN
  Administered 2024-07-14: 30.9 via INTRAVENOUS

## 2024-07-14 MED ORDER — TECHNETIUM TC 99M SESTAMIBI GENERIC - CARDIOLITE
10.6000 | Freq: Once | INTRAVENOUS | Status: AC | PRN
  Administered 2024-07-14: 10.6 via INTRAVENOUS

## 2024-07-15 ENCOUNTER — Encounter: Payer: Self-pay | Admitting: Cardiovascular Disease

## 2024-07-15 ENCOUNTER — Ambulatory Visit: Admitting: Cardiovascular Disease

## 2024-07-15 VITALS — BP 128/72 | HR 83 | Ht 71.0 in | Wt 258.0 lb

## 2024-07-15 DIAGNOSIS — R0602 Shortness of breath: Secondary | ICD-10-CM

## 2024-07-15 DIAGNOSIS — I2510 Atherosclerotic heart disease of native coronary artery without angina pectoris: Secondary | ICD-10-CM | POA: Diagnosis not present

## 2024-07-15 DIAGNOSIS — E669 Obesity, unspecified: Secondary | ICD-10-CM | POA: Diagnosis not present

## 2024-07-15 DIAGNOSIS — E782 Mixed hyperlipidemia: Secondary | ICD-10-CM

## 2024-07-15 DIAGNOSIS — I10 Essential (primary) hypertension: Secondary | ICD-10-CM | POA: Diagnosis not present

## 2024-07-15 DIAGNOSIS — I4891 Unspecified atrial fibrillation: Secondary | ICD-10-CM

## 2024-07-15 DIAGNOSIS — R0789 Other chest pain: Secondary | ICD-10-CM

## 2024-07-15 DIAGNOSIS — R002 Palpitations: Secondary | ICD-10-CM | POA: Diagnosis not present

## 2024-07-15 MED ORDER — CLOPIDOGREL BISULFATE 75 MG PO TABS: 75.0000 mg | ORAL_TABLET | Freq: Every day | ORAL | 11 refills | Status: AC

## 2024-07-15 MED ORDER — PANTOPRAZOLE SODIUM 40 MG PO TBEC: 40.0000 mg | DELAYED_RELEASE_TABLET | Freq: Every day | ORAL | 1 refills | Status: AC

## 2024-07-15 MED ORDER — APIXABAN 5 MG PO TABS: 5.0000 mg | ORAL_TABLET | Freq: Two times a day (BID) | ORAL | 0 refills | Status: AC

## 2024-07-15 NOTE — Progress Notes (Signed)
 "     Cardiology Office Note   Date:  07/15/2024   ID:  Aleathia L Myrie, DOB 12-19-1980, MRN [id number]  PCP:  [facility]  Cardiologist:  Denyse Bathe, MD      History of Present Illness: Sonya [name] is a 44 y.o. female who presents for  Chief Complaint  Patient presents with   Follow-up    NST/Echo results    Went to ER with chest pains Friday, but none now.      Past Medical History:  Diagnosis Date   Coronary artery disease    GERD (gastroesophageal reflux disease)    H/O heart artery stent    Hyperlipidemia    Hypertension    Myocardial infarction (HCC)    Tobacco abuse      Past Surgical History:  Procedure Laterality Date   CORONARY STENT INTERVENTION N/A 06/10/2018   Procedure: CORONARY STENT INTERVENTION;  Surgeon: Florencio Cara BIRCH, MD;  Location: [facility]IVE [facility];  Service: Cardiovascular;  Laterality: N/A;   CORONARY STENT INTERVENTION N/A 04/14/2021   Procedure: CORONARY STENT INTERVENTION;  Surgeon: Ammon Blunt, MD;  Location: [facility];  Service: Cardiovascular;  Laterality: N/A;   CORONARY STENT INTERVENTION N/A 01/20/2022   Procedure: CORONARY STENT INTERVENTION;  Surgeon: Ammon Blunt, MD;  Location: [facility];  Service: Cardiovascular;  Laterality: N/A;   CORONARY/GRAFT ACUTE MI REVASCULARIZATION N/A 10/11/2021   Procedure: Coronary/Graft Acute MI Revascularization;  Surgeon: Mady Bruckner, MD;  Location: [facility];  Service: Cardiovascular;  Laterality: N/A;   LEFT HEART CATH AND CORONARY ANGIOGRAPHY N/A 06/10/2018   Procedure: With coronary intervention and stenting;  Surgeon: Bathe Denyse LABOR, MD;  Location: [facility]IVE CV [facility];  Service: Cardiovascular;  Laterality: N/A;   LEFT HEART CATH AND CORONARY ANGIOGRAPHY Left 06/15/2020   Procedure: LEFT HEART CATH AND CORONARY ANGIOGRAPHY;  Surgeon: Bathe Denyse LABOR, MD;  Location: [facility];  Service:  Cardiovascular;  Laterality: Left;   LEFT HEART CATH AND CORONARY ANGIOGRAPHY N/A 04/14/2021   Procedure: LEFT HEART CATH AND CORONARY ANGIOGRAPHY with intervention;  Surgeon: Bathe Denyse LABOR, MD;  Location: [facility];  Service: Cardiovascular;  Laterality: N/A;   LEFT HEART CATH AND CORONARY ANGIOGRAPHY N/A 10/11/2021   Procedure: LEFT HEART CATH AND CORONARY ANGIOGRAPHY;  Surgeon: Mady Bruckner, MD;  Location: [facility];  Service: Cardiovascular;  Laterality: N/A;   LEFT HEART CATH AND CORONARY ANGIOGRAPHY N/A 01/20/2022   Procedure: LEFT HEART CATH AND CORONARY ANGIOGRAPHY;  Surgeon: Bathe Denyse LABOR, MD;  Location: [facility];  Service: Cardiovascular;  Laterality: N/A;   LEFT HEART CATH AND CORONARY ANGIOGRAPHY N/A 04/05/2022   Procedure: LEFT HEART CATH AND CORONARY ANGIOGRAPHY;  Surgeon: Dann Candyce RAMAN, MD;  Location: [facility] IN[facility] CV LAB;  Service: Cardiovascular;  Laterality: N/A;   TUBAL LIGATION     XI ROBOTIC LAPAROSCOPIC ASSISTED APPENDECTOMY N/A 01/14/2024   Procedure: APPENDECTOMY, ROBOT-ASSISTED, LAPAROSCOPIC;  Surgeon: Tye Millet, DO;  Location: [facility];  Service: General;  Laterality: N/A;     Current Outpatient Medications  Medication Sig Dispense Refill   pantoprazole  (PROTONIX ) 40 MG tablet Take 1 tablet (40 mg total) by mouth daily. 30 tablet 1   Alirocumab  (PRALUENT ) 150 MG/ML SOAJ Inject 1 mL (150 mg total) into the skin every 14 (fourteen) days. 2 mL 2   amiodarone  (PACERONE ) 200 MG tablet Take 4 tablets/day for 1 week, then 3 tablets/day for  1 week, then 2 tablets/day for 1 week. Then follow up. 70 tablet 0   amLODipine  (NORVASC ) 5 MG tablet TAKE 1 TABLET (5 MG TOTAL) BY MOUTH DAILY. 90 tablet 3   apixaban  (ELIQUIS ) 5 MG TABS tablet Take 1 tablet (5 mg total) by mouth 2 (two) times daily. 60 tablet 0   atorvastatin  (LIPITOR ) 80 MG tablet Take 1 tablet (80 mg total) by mouth daily. 30 tablet 11   Bacillus Coagulans-Inulin  (PROBIOTIC) 1-250 BILLION-MG CAPS Take 1 capsule by mouth daily. 30 capsule 0   busPIRone  (BUSPAR ) 10 MG tablet Take 10 mg by mouth 2 (two) times daily as needed.     carvedilol  (COREG ) 12.5 MG tablet Take 1 tablet (12.5 mg total) by mouth 2 (two) times daily. 180 tablet 1   Cholecalciferol  (VITAMIN D ) 125 MCG (5000 UT) CAPS Take 5,000 Units by mouth daily.     clopidogrel  (PLAVIX ) 75 MG tablet Take 1 tablet (75 mg total) by mouth daily. 30 tablet 11   Cyclobenzaprine HCl (FLEXERIL PO) Take 5 mg by mouth. prn     famotidine  (PEPCID ) 20 MG tablet Take 1 tablet (20 mg total) by mouth 2 (two) times daily. 60 tablet 4   isosorbide  mononitrate (IMDUR ) 120 MG 24 hr tablet Take 1 tablet (120 mg total) by mouth 2 (two) times daily. Takes half a pill twice a day     metroNIDAZOLE  (FLAGYL ) 500 MG tablet Take 1 tablet (500 mg total) by mouth 2 (two) times daily. 10 tablet 0   nitroGLYCERIN  (NITROSTAT ) 0.4 MG SL tablet Place 1 tablet (0.4 mg total) under the tongue every 5 (five) minutes as needed for chest pain. 30 tablet 12   nystatin cream (MYCOSTATIN) Apply 1 application topically at bedtime as needed (irritation).     oxyCODONE -acetaminophen  (PERCOCET) 5-325 MG tablet Take 1 tablet by mouth every 8 (eight) hours as needed for severe pain (pain score 7-10). 6 tablet 0   ranolazine  (RANEXA ) 1000 MG SR tablet TAKE 1 TABLET BY MOUTH TWICE A DAY 180 tablet 1   semaglutide -weight management (WEGOVY ) 0.25 MG/0.5ML SOAJ SQ injection INJECT 0.25MG  INTO THE SKIN ONE TIME PER WEEK 2 mL 4   No current facility-administered medications for this visit.    Allergies:   Penicillins, Atorvastatin  calcium , Metoprolol , Penicillins, Repatha  [evolocumab ], and Zetia  [ezetimibe ]    Social History:   reports that she quit smoking about 3 years ago. Her smoking use included cigarettes. She started smoking about 23 years ago. She has a 10 pack-year smoking history. She has never used smokeless tobacco. She reports that she does  not currently use alcohol. She reports that she does not use drugs.   Family History:  family history includes Heart attack in her father and mother.    ROS:     Review of Systems  Constitutional: Negative.   HENT: Negative.    Eyes: Negative.   Respiratory: Negative.    Gastrointestinal: Negative.   Genitourinary: Negative.   Musculoskeletal: Negative.   Skin: Negative.   Neurological: Negative.   Endo/Heme/Allergies: Negative.   Psychiatric/Behavioral: Negative.    All other systems reviewed and are negative.     All other systems are reviewed and negative.    PHYSICAL EXAM: VS:  BP 128/72   Pulse 83   Ht 5' 11 (1.803 m)   Wt 258 lb (117 kg)   SpO2 95%   BMI 35.98 kg/m  , BMI Body mass index is 35.98 kg/m. Last weight:  Wt Readings  from Last 3 Encounters:  07/15/24 258 lb (117 kg)  07/11/24 262 lb (118.8 kg)  05/27/24 267 lb (121.1 kg)     Physical Exam Constitutional:      Appearance: Normal appearance.  Cardiovascular:     Rate and Rhythm: Normal rate and regular rhythm.     Heart sounds: Normal heart sounds.  Pulmonary:     Effort: Pulmonary effort is normal.     Breath sounds: Normal breath sounds.  Musculoskeletal:     Right lower leg: No edema.     Left lower leg: No edema.  Neurological:     Mental Status: She is alert.       EKG:   Recent Labs: 04/24/2024: TSH 0.550 07/11/2024: ALT 13; BUN 15; Creatinine, Ser 1.04; Hemoglobin 13.8; Platelets 272; Potassium 3.4; Sodium 139    Lipid Panel    Component Value Date/Time   CHOL 262 (H) 04/24/2024 1040   TRIG 130 04/24/2024 1040   HDL 50 04/24/2024 1040   CHOLHDL 5.2 (H) 04/24/2024 1040   CHOLHDL 5.0 04/05/2022 0938   VLDL 12 04/05/2022 0938   LDLCALC 188 (H) 04/24/2024 1040      Other studies Reviewed: Additional studies/ records that were reviewed today include:  Review of the above records demonstrates:      11/07/2022    3:20 PM  PAD Screen  Previous PAD dx? No  Previous  surgical procedure? No  Pain with walking? No  Feet/toe relief with dangling? No  Painful, non-healing ulcers? No  Extremities discolored? No      ASSESSMENT AND PLAN:    ICD-10-CM   1. Other chest pain  R07.89 apixaban  (ELIQUIS ) 5 MG TABS tablet    clopidogrel  (PLAVIX ) 75 MG tablet    pantoprazole  (PROTONIX ) 40 MG tablet   stress test and ech were fine. Add protonix  to famotadine as has GERD    2. Primary hypertension  I10 apixaban  (ELIQUIS ) 5 MG TABS tablet    clopidogrel  (PLAVIX ) 75 MG tablet    pantoprazole  (PROTONIX ) 40 MG tablet    3. Mixed hyperlipidemia  E78.2 apixaban  (ELIQUIS ) 5 MG TABS tablet    clopidogrel  (PLAVIX ) 75 MG tablet    pantoprazole  (PROTONIX ) 40 MG tablet    4. Palpitations  R00.2 apixaban  (ELIQUIS ) 5 MG TABS tablet    clopidogrel  (PLAVIX ) 75 MG tablet    pantoprazole  (PROTONIX ) 40 MG tablet    5. Coronary artery disease involving native coronary artery of native heart without angina pectoris  I25.10 apixaban  (ELIQUIS ) 5 MG TABS tablet    clopidogrel  (PLAVIX ) 75 MG tablet    pantoprazole  (PROTONIX ) 40 MG tablet    6. Obesity (BMI 35.0-39.9 without comorbidity)  E66.9 apixaban  (ELIQUIS ) 5 MG TABS tablet    clopidogrel  (PLAVIX ) 75 MG tablet    pantoprazole  (PROTONIX ) 40 MG tablet    7. Atrial fibrillation, new onset (HCC)  I48.91 apixaban  (ELIQUIS ) 5 MG TABS tablet    clopidogrel  (PLAVIX ) 75 MG tablet    pantoprazole  (PROTONIX ) 40 MG tablet    8. SOB (shortness of breath)  R06.02 pantoprazole  (PROTONIX ) 40 MG tablet    9. Other chest pain  R07.89 apixaban  (ELIQUIS ) 5 MG TABS tablet    clopidogrel  (PLAVIX ) 75 MG tablet    pantoprazole  (PROTONIX ) 40 MG tablet       Problem List Items Addressed This Visit       Cardiovascular and Mediastinum   Coronary artery disease   Relevant Medications   apixaban  (ELIQUIS ) 5 MG  TABS tablet   clopidogrel  (PLAVIX ) 75 MG tablet   pantoprazole  (PROTONIX ) 40 MG tablet   HTN (hypertension)   Relevant  Medications   apixaban  (ELIQUIS ) 5 MG TABS tablet   clopidogrel  (PLAVIX ) 75 MG tablet   pantoprazole  (PROTONIX ) 40 MG tablet     Other   HLD (hyperlipidemia)   Relevant Medications   apixaban  (ELIQUIS ) 5 MG TABS tablet   clopidogrel  (PLAVIX ) 75 MG tablet   pantoprazole  (PROTONIX ) 40 MG tablet   Chest pain - Primary   Relevant Medications   apixaban  (ELIQUIS ) 5 MG TABS tablet   clopidogrel  (PLAVIX ) 75 MG tablet   pantoprazole  (PROTONIX ) 40 MG tablet   Obesity (BMI 35.0-39.9 without comorbidity)   Relevant Medications   apixaban  (ELIQUIS ) 5 MG TABS tablet   clopidogrel  (PLAVIX ) 75 MG tablet   pantoprazole  (PROTONIX ) 40 MG tablet   Palpitations   Relevant Medications   apixaban  (ELIQUIS ) 5 MG TABS tablet   clopidogrel  (PLAVIX ) 75 MG tablet   pantoprazole  (PROTONIX ) 40 MG tablet   Other Visit Diagnoses       Atrial fibrillation, new onset (HCC)       Relevant Medications   apixaban  (ELIQUIS ) 5 MG TABS tablet   clopidogrel  (PLAVIX ) 75 MG tablet   pantoprazole  (PROTONIX ) 40 MG tablet     SOB (shortness of breath)       Relevant Medications   pantoprazole  (PROTONIX ) 40 MG tablet          Disposition:   Return in about 2 months (around 09/12/2024).    Total time spent: 35 minutes  Signed,  Denyse Bathe, MD  07/15/2024 10:06 AM    [facility] "

## 2024-07-16 ENCOUNTER — Ambulatory Visit: Admitting: Medical

## 2024-07-22 ENCOUNTER — Ambulatory Visit: Admitting: Medical

## 2024-07-22 NOTE — Progress Notes (Unsigned)
" °  Cardiology Office Note   Date:  07/22/2024  ID:  Sonya [name], DOB Jun 11, 1981, MRN [id number] [facility]CP: [facility] Providers Cardiologist:  None { Click to update primary MD,subspecialty MD or APP then REFRESH:1}    History of Present Illness Sonya [name] is a 44 y.o. female CAD, HTN, HLD, chest pain, obesity, Afib who presents for 1 year follow-up.     ROS: ***  Studies Reviewed      *** Risk Assessment/Calculations {Does this patient have ATRIAL FIBRILLATION?:[phone]} No BP recorded.  {Refresh Note OR Click here to enter BP  :1}***       Physical Exam VS:  There were no vitals taken for this visit.       Wt Readings from Last 3 Encounters:  07/15/24 258 lb (117 kg)  07/11/24 262 lb (118.8 kg)  05/27/24 267 lb (121.1 kg)    GEN: Well nourished, well developed in no acute distress NECK: No JVD; No carotid bruits CARDIAC: ***RRR, no murmurs, rubs, gallops RESPIRATORY:  Clear to auscultation without rales, wheezing or rhonchi  ABDOMEN: Soft, non-tender, non-distended EXTREMITIES:  No edema; No deformity   ASSESSMENT AND PLAN ***    {Are you ordering a CV Procedure (e.g. stress test, cath, DCCV, TEE, etc)?   Press F2        :[id number]}  Dispo: ***  Signed, Mickie Badders VEAR Fishman, PA-C   "

## 2024-09-12 ENCOUNTER — Ambulatory Visit: Admitting: Cardiovascular Disease
